# Patient Record
Sex: Female | Born: 1935 | Race: White | Hispanic: No | Smoking: Former smoker
Health system: Southern US, Community
[De-identification: ages and names within clinical notes are randomized; demographics above are authoritative.]

## PROBLEM LIST (undated history)

## (undated) DIAGNOSIS — C50919 Malignant neoplasm of unspecified site of unspecified female breast: Secondary | ICD-10-CM

## (undated) DIAGNOSIS — Z923 Personal history of irradiation: Secondary | ICD-10-CM

## (undated) DIAGNOSIS — F32A Depression, unspecified: Secondary | ICD-10-CM

## (undated) DIAGNOSIS — H269 Unspecified cataract: Secondary | ICD-10-CM

## (undated) DIAGNOSIS — R0602 Shortness of breath: Secondary | ICD-10-CM

## (undated) DIAGNOSIS — I10 Essential (primary) hypertension: Secondary | ICD-10-CM

## (undated) DIAGNOSIS — M549 Dorsalgia, unspecified: Secondary | ICD-10-CM

## (undated) DIAGNOSIS — F329 Major depressive disorder, single episode, unspecified: Secondary | ICD-10-CM

## (undated) DIAGNOSIS — R232 Flushing: Secondary | ICD-10-CM

## (undated) HISTORY — DX: Depression, unspecified: F32.A

## (undated) HISTORY — PX: BREAST BIOPSY: SHX20

## (undated) HISTORY — DX: Dorsalgia, unspecified: M54.9

## (undated) HISTORY — DX: Malignant neoplasm of unspecified site of unspecified female breast: C50.919

## (undated) HISTORY — DX: Flushing: R23.2

## (undated) HISTORY — PX: DILATION AND CURETTAGE OF UTERUS: SHX78

## (undated) HISTORY — DX: Shortness of breath: R06.02

## (undated) HISTORY — DX: Personal history of irradiation: Z92.3

## (undated) HISTORY — DX: Unspecified cataract: H26.9

## (undated) HISTORY — PX: CATARACT EXTRACTION: SUR2

## (undated) HISTORY — DX: Major depressive disorder, single episode, unspecified: F32.9

## (undated) HISTORY — PX: BREAST LUMPECTOMY: SHX2

## (undated) HISTORY — PX: COLONOSCOPY: SHX174

## (undated) HISTORY — DX: Essential (primary) hypertension: I10

---

## 1974-06-20 HISTORY — PX: BREAST SURGERY: SHX581

## 1998-11-27 ENCOUNTER — Other Ambulatory Visit: Admission: RE | Admit: 1998-11-27 | Discharge: 1998-11-27 | Payer: Self-pay | Admitting: Family Medicine

## 1999-04-16 ENCOUNTER — Encounter: Admission: RE | Admit: 1999-04-16 | Discharge: 1999-04-16 | Payer: Self-pay | Admitting: Family Medicine

## 1999-04-16 ENCOUNTER — Encounter: Payer: Self-pay | Admitting: Family Medicine

## 1999-05-11 ENCOUNTER — Encounter (INDEPENDENT_AMBULATORY_CARE_PROVIDER_SITE_OTHER): Payer: Self-pay | Admitting: *Deleted

## 1999-05-11 ENCOUNTER — Ambulatory Visit (HOSPITAL_COMMUNITY): Admission: RE | Admit: 1999-05-11 | Discharge: 1999-05-11 | Payer: Self-pay | Admitting: Gastroenterology

## 2000-06-07 ENCOUNTER — Encounter: Admission: RE | Admit: 2000-06-07 | Discharge: 2000-09-05 | Payer: Self-pay | Admitting: Internal Medicine

## 2000-09-18 ENCOUNTER — Encounter: Admission: RE | Admit: 2000-09-18 | Discharge: 2000-09-25 | Payer: Self-pay

## 2000-12-26 ENCOUNTER — Encounter: Admission: RE | Admit: 2000-12-26 | Discharge: 2001-01-01 | Payer: Self-pay | Admitting: Orthopedic Surgery

## 2001-02-15 ENCOUNTER — Other Ambulatory Visit: Admission: RE | Admit: 2001-02-15 | Discharge: 2001-02-15 | Payer: Self-pay | Admitting: Obstetrics and Gynecology

## 2001-10-02 ENCOUNTER — Encounter: Admission: RE | Admit: 2001-10-02 | Discharge: 2001-12-31 | Payer: Self-pay | Admitting: Family Medicine

## 2001-10-29 ENCOUNTER — Other Ambulatory Visit: Admission: RE | Admit: 2001-10-29 | Discharge: 2001-10-29 | Payer: Self-pay | Admitting: Obstetrics and Gynecology

## 2002-08-01 ENCOUNTER — Other Ambulatory Visit: Admission: RE | Admit: 2002-08-01 | Discharge: 2002-08-01 | Payer: Self-pay | Admitting: Obstetrics and Gynecology

## 2002-10-01 ENCOUNTER — Encounter (INDEPENDENT_AMBULATORY_CARE_PROVIDER_SITE_OTHER): Payer: Self-pay | Admitting: Specialist

## 2002-10-01 ENCOUNTER — Ambulatory Visit (HOSPITAL_COMMUNITY): Admission: RE | Admit: 2002-10-01 | Discharge: 2002-10-01 | Payer: Self-pay | Admitting: Gastroenterology

## 2005-03-16 ENCOUNTER — Other Ambulatory Visit: Admission: RE | Admit: 2005-03-16 | Discharge: 2005-03-16 | Payer: Self-pay | Admitting: Family Medicine

## 2005-11-04 ENCOUNTER — Encounter: Admission: RE | Admit: 2005-11-04 | Discharge: 2005-11-04 | Payer: Self-pay | Admitting: Family Medicine

## 2006-04-05 ENCOUNTER — Other Ambulatory Visit: Admission: RE | Admit: 2006-04-05 | Discharge: 2006-04-05 | Payer: Self-pay | Admitting: Family Medicine

## 2006-11-29 ENCOUNTER — Encounter: Admission: RE | Admit: 2006-11-29 | Discharge: 2006-11-29 | Payer: Self-pay | Admitting: Family Medicine

## 2007-10-15 ENCOUNTER — Other Ambulatory Visit: Admission: RE | Admit: 2007-10-15 | Discharge: 2007-10-15 | Payer: Self-pay | Admitting: Family Medicine

## 2007-12-12 ENCOUNTER — Encounter: Admission: RE | Admit: 2007-12-12 | Discharge: 2007-12-12 | Payer: Self-pay | Admitting: Family Medicine

## 2007-12-20 ENCOUNTER — Encounter: Admission: RE | Admit: 2007-12-20 | Discharge: 2007-12-20 | Payer: Self-pay | Admitting: Family Medicine

## 2008-10-20 ENCOUNTER — Other Ambulatory Visit: Admission: RE | Admit: 2008-10-20 | Discharge: 2008-10-20 | Payer: Self-pay | Admitting: Family Medicine

## 2008-11-03 ENCOUNTER — Encounter: Admission: RE | Admit: 2008-11-03 | Discharge: 2009-01-14 | Payer: Self-pay | Admitting: Family Medicine

## 2008-11-07 ENCOUNTER — Encounter: Admission: RE | Admit: 2008-11-07 | Discharge: 2008-11-07 | Payer: Self-pay | Admitting: Family Medicine

## 2008-12-12 ENCOUNTER — Encounter: Admission: RE | Admit: 2008-12-12 | Discharge: 2008-12-12 | Payer: Self-pay | Admitting: Family Medicine

## 2009-12-15 ENCOUNTER — Encounter: Admission: RE | Admit: 2009-12-15 | Discharge: 2009-12-15 | Payer: Self-pay | Admitting: Family Medicine

## 2010-07-12 ENCOUNTER — Encounter: Payer: Self-pay | Admitting: Family Medicine

## 2010-08-21 ENCOUNTER — Emergency Department (HOSPITAL_COMMUNITY)
Admission: EM | Admit: 2010-08-21 | Discharge: 2010-08-21 | Disposition: A | Discharge status: Home / Self Care | Payer: Medicare Other | Attending: Emergency Medicine | Admitting: Emergency Medicine

## 2010-08-21 ENCOUNTER — Emergency Department (HOSPITAL_COMMUNITY): Payer: Medicare Other

## 2010-08-21 DIAGNOSIS — E7800 Pure hypercholesterolemia, unspecified: Secondary | ICD-10-CM | POA: Insufficient documentation

## 2010-08-21 DIAGNOSIS — I10 Essential (primary) hypertension: Secondary | ICD-10-CM | POA: Insufficient documentation

## 2010-08-21 DIAGNOSIS — N390 Urinary tract infection, site not specified: Secondary | ICD-10-CM | POA: Insufficient documentation

## 2010-08-21 DIAGNOSIS — R5383 Other fatigue: Secondary | ICD-10-CM | POA: Insufficient documentation

## 2010-08-21 DIAGNOSIS — E1169 Type 2 diabetes mellitus with other specified complication: Secondary | ICD-10-CM | POA: Insufficient documentation

## 2010-08-21 DIAGNOSIS — R0789 Other chest pain: Secondary | ICD-10-CM | POA: Insufficient documentation

## 2010-08-21 DIAGNOSIS — R42 Dizziness and giddiness: Secondary | ICD-10-CM | POA: Insufficient documentation

## 2010-08-21 DIAGNOSIS — R5381 Other malaise: Secondary | ICD-10-CM | POA: Insufficient documentation

## 2010-08-21 DIAGNOSIS — R002 Palpitations: Secondary | ICD-10-CM | POA: Insufficient documentation

## 2010-08-21 LAB — CBC
Hemoglobin: 11.4 g/dL — ABNORMAL LOW (ref 12.0–15.0)
MCH: 29.9 pg (ref 26.0–34.0)
MCHC: 32.7 g/dL (ref 30.0–36.0)
Platelets: 279 10*3/uL (ref 150–400)
RBC: 3.81 MIL/uL — ABNORMAL LOW (ref 3.87–5.11)

## 2010-08-21 LAB — COMPREHENSIVE METABOLIC PANEL
ALT: 16 U/L (ref 0–35)
AST: 22 U/L (ref 0–37)
Albumin: 3.7 g/dL (ref 3.5–5.2)
CO2: 25 mEq/L (ref 19–32)
Calcium: 8.8 mg/dL (ref 8.4–10.5)
Creatinine, Ser: 1.81 mg/dL — ABNORMAL HIGH (ref 0.4–1.2)
GFR calc Af Amer: 33 mL/min — ABNORMAL LOW (ref >60)
Sodium: 136 mEq/L (ref 135–145)
Total Protein: 7.4 g/dL (ref 6.0–8.3)

## 2010-08-21 LAB — DIFFERENTIAL
Basophils Absolute: 0 10*3/uL (ref 0.0–0.1)
Basophils Relative: 1 % (ref 0–1)
Eosinophils Absolute: 0.1 10*3/uL (ref 0.0–0.7)
Monocytes Absolute: 0.5 10*3/uL (ref 0.1–1.0)
Monocytes Relative: 8 % (ref 3–12)
Neutro Abs: 4.3 10*3/uL (ref 1.7–7.7)
Neutrophils Relative %: 66 % (ref 43–77)

## 2010-08-21 LAB — POCT CARDIAC MARKERS
CKMB, poc: <1 ng/mL — ABNORMAL LOW (ref 1.0–8.0)
Myoglobin, poc: 87.6 ng/mL (ref 12–200)

## 2010-08-21 LAB — URINE MICROSCOPIC-ADD ON

## 2010-08-21 LAB — URINALYSIS, ROUTINE W REFLEX MICROSCOPIC
Bilirubin Urine: NEGATIVE
Glucose, UA: 100 mg/dL — AB
Hgb urine dipstick: NEGATIVE
Ketones, ur: NEGATIVE mg/dL
Nitrite: NEGATIVE
Specific Gravity, Urine: 1.014 (ref 1.005–1.030)
pH: 5 (ref 5.0–8.0)

## 2010-08-21 LAB — GLUCOSE, CAPILLARY: Glucose-Capillary: 121 mg/dL — ABNORMAL HIGH (ref 70–99)

## 2010-09-13 ENCOUNTER — Emergency Department (HOSPITAL_COMMUNITY): Payer: No Typology Code available for payment source

## 2010-09-13 ENCOUNTER — Emergency Department (HOSPITAL_COMMUNITY)
Admission: EM | Admit: 2010-09-13 | Discharge: 2010-09-13 | Disposition: A | Discharge status: Home / Self Care | Payer: No Typology Code available for payment source | Attending: General Surgery | Admitting: General Surgery

## 2010-09-13 DIAGNOSIS — E7800 Pure hypercholesterolemia, unspecified: Secondary | ICD-10-CM | POA: Insufficient documentation

## 2010-09-13 DIAGNOSIS — E119 Type 2 diabetes mellitus without complications: Secondary | ICD-10-CM | POA: Insufficient documentation

## 2010-09-13 DIAGNOSIS — R079 Chest pain, unspecified: Secondary | ICD-10-CM | POA: Insufficient documentation

## 2010-09-13 DIAGNOSIS — M25519 Pain in unspecified shoulder: Secondary | ICD-10-CM | POA: Insufficient documentation

## 2010-09-13 DIAGNOSIS — I10 Essential (primary) hypertension: Secondary | ICD-10-CM | POA: Insufficient documentation

## 2010-10-12 ENCOUNTER — Ambulatory Visit: Payer: Medicare Other | Attending: Family Medicine

## 2010-10-12 DIAGNOSIS — M2560 Stiffness of unspecified joint, not elsewhere classified: Secondary | ICD-10-CM | POA: Insufficient documentation

## 2010-10-12 DIAGNOSIS — M2550 Pain in unspecified joint: Secondary | ICD-10-CM | POA: Insufficient documentation

## 2010-10-12 DIAGNOSIS — R293 Abnormal posture: Secondary | ICD-10-CM | POA: Insufficient documentation

## 2010-10-12 DIAGNOSIS — R5381 Other malaise: Secondary | ICD-10-CM | POA: Insufficient documentation

## 2010-10-12 DIAGNOSIS — IMO0001 Reserved for inherently not codable concepts without codable children: Secondary | ICD-10-CM | POA: Insufficient documentation

## 2010-10-20 ENCOUNTER — Ambulatory Visit: Payer: Medicare Other | Attending: Family Medicine | Admitting: Physical Therapy

## 2010-10-20 DIAGNOSIS — M2560 Stiffness of unspecified joint, not elsewhere classified: Secondary | ICD-10-CM | POA: Insufficient documentation

## 2010-10-20 DIAGNOSIS — R5381 Other malaise: Secondary | ICD-10-CM | POA: Insufficient documentation

## 2010-10-20 DIAGNOSIS — R293 Abnormal posture: Secondary | ICD-10-CM | POA: Insufficient documentation

## 2010-10-20 DIAGNOSIS — M2550 Pain in unspecified joint: Secondary | ICD-10-CM | POA: Insufficient documentation

## 2010-10-20 DIAGNOSIS — IMO0001 Reserved for inherently not codable concepts without codable children: Secondary | ICD-10-CM | POA: Insufficient documentation

## 2010-10-25 ENCOUNTER — Ambulatory Visit: Payer: Medicare Other | Admitting: Physical Therapy

## 2010-10-28 ENCOUNTER — Ambulatory Visit: Payer: Medicare Other | Admitting: Physical Therapy

## 2010-11-03 ENCOUNTER — Ambulatory Visit: Payer: Medicare Other | Admitting: Physical Therapy

## 2010-11-04 ENCOUNTER — Encounter: Payer: Medicare Other | Admitting: Physical Therapy

## 2010-11-05 NOTE — Consult Note (Signed)
[facility]  Patient:    Brittney Wright, Brittney Wright                      MRN: [id number] Proc. Date: 06/07/00 Adm. Date:  [id number] Attending:  Lucianne Muss                          Consultation Report  CHIEF COMPLAINT:  Desires evaluation of her feet since she is diabetic.  PRESENT ILLNESS:  This 76 year old white female was diagnosed in 1998 as having non-insulin-dependent diabetes mellitus.  She has been maintained on Glucotrol one tablet a day.  The patient recently had a "ingrown toenail" of the right big toe laterally.  She used topical antibiotics and this has cleared.  Patient was just concerned about her feet because she is a diabetic and wanted to be seen in the clinic.  She has been treating her toes by cutting her nails herself but would like to have her nails trimmed in the nail clinic and to have her feet followed.  MEDICATIONS:  Lipitor, Glucotrol, Zestril, ______  and progesterone.  ALLERGIES:  No drug allergies.  PAST MEDICAL HISTORY AND REVIEW OF SYSTEMS:  Has had some hypertension, with elevated cholesterol.  PHYSICAL EXAMINATION  EXTREMITIES:  Examination reveals healthy-appearing feet with no breakdown of the skin.  She has normal sensation and the temperatures are normal.  She has good posterior tibial pulses.  She has some evidence of an old infection of the right big toe laterally but there is no infection at this time.  She is cutting her nails properly, straight across and not down on the sides.  ASSESSMENT AND PLAN:  Patient was advised to observe her feet on a regular basis to be sure that she is not having any breakdown of the skin.  She would like to be seen back in the nail clinic to have her nails trimmed.  We will see her back in the clinic in three months to have her nails trimmed. DD:  06/07/00 TD:  06/08/00 Job: [id number]

## 2010-11-05 NOTE — Op Note (Signed)
   NAME:  Brittney Wright, Brittney Wright                         ACCOUNT NO.:  [id number]   MEDICAL RECORD NO.:  [id number]                   PATIENT TYPE:  AMB   LOCATION:  ENDO                                 FAC[facility]  [facility]   PHYSICIAN:  James L. Rolla Flatten., M.D.          DATE OF BIRTH:  [birth year]   DATE OF PROCEDURE:  10/01/2002  DATE OF DISCHARGE:                                 OPERATIVE REPORT   PROCEDURE:  Colonoscopy and polypectomy.   MEDICATIONS:  Fentanyl 75 mcg, Versed 7.5 mg IV.   INDICATIONS:  The patient had had a previous colon polyp removed three years  ago.  This is done as a three-year follow-up.   DESCRIPTION OF PROCEDURE:  The procedure had been explained to the patient  and consent obtained.  With the patient in the left lateral decubitus  position, the Olympus pediatric adjustable scope was inserted and advanced  under direct visualization.  The prep was quite good.  We were able to reach  the cecum without difficulty.  The ileocecal valve and appendiceal orifice  were seen.  The scope was withdrawn, and the cecum, ascending colon,  transverse colon, descending and sigmoid colon were seen well and no polyp  was seen.  The scope was withdrawn down the rectum, and there was a 0.75 cm  polyp in the rectum.  It was removed with the snare and sucked through the  scope.  There was no bleeding.  No other polyps were seen.  The scope was  withdrawn.  The patient tolerated the procedure well and was maintained on  low-flow oxygen and pulse oximetry throughout the procedure.   ASSESSMENT:  Rectal polyp, snared.   PLAN:  Routine postpolypectomy instructions.  Will recommend repeating in  three years.                                               James L. Rolla Flatten., M.D.    Jaynie Bream  D:  10/01/2002  T:  10/01/2002  Job:  [id number]   cc:   Russ Halo. Sheryn Bison, M.D.  [address]  [place]  [place] [address]  Fax: [phone]

## 2010-11-08 ENCOUNTER — Ambulatory Visit: Payer: Medicare Other | Admitting: Physical Therapy

## 2010-11-11 ENCOUNTER — Ambulatory Visit: Payer: Medicare Other | Admitting: Physical Therapy

## 2010-11-16 ENCOUNTER — Ambulatory Visit: Payer: Medicare Other

## 2010-11-18 ENCOUNTER — Ambulatory Visit: Payer: Medicare Other | Admitting: Physical Therapy

## 2010-11-19 ENCOUNTER — Other Ambulatory Visit: Payer: Self-pay | Admitting: Family Medicine

## 2010-11-19 DIAGNOSIS — Z1231 Encounter for screening mammogram for malignant neoplasm of breast: Secondary | ICD-10-CM

## 2010-11-22 ENCOUNTER — Ambulatory Visit: Payer: Medicare Other | Attending: Family Medicine | Admitting: Physical Therapy

## 2010-11-22 DIAGNOSIS — R293 Abnormal posture: Secondary | ICD-10-CM | POA: Insufficient documentation

## 2010-11-22 DIAGNOSIS — R5381 Other malaise: Secondary | ICD-10-CM | POA: Insufficient documentation

## 2010-11-22 DIAGNOSIS — M2550 Pain in unspecified joint: Secondary | ICD-10-CM | POA: Insufficient documentation

## 2010-11-22 DIAGNOSIS — IMO0001 Reserved for inherently not codable concepts without codable children: Secondary | ICD-10-CM | POA: Insufficient documentation

## 2010-11-22 DIAGNOSIS — M2560 Stiffness of unspecified joint, not elsewhere classified: Secondary | ICD-10-CM | POA: Insufficient documentation

## 2010-11-25 ENCOUNTER — Encounter: Payer: No Typology Code available for payment source | Admitting: Physical Therapy

## 2010-11-29 ENCOUNTER — Encounter: Payer: Medicare Other | Admitting: Physical Therapy

## 2010-12-02 ENCOUNTER — Ambulatory Visit: Payer: Medicare Other | Admitting: Physical Therapy

## 2010-12-06 ENCOUNTER — Ambulatory Visit: Payer: Medicare Other | Admitting: Rehabilitative and Restorative Service Providers"

## 2010-12-08 ENCOUNTER — Ambulatory Visit: Payer: Medicare Other | Admitting: Rehabilitative and Restorative Service Providers"

## 2010-12-15 ENCOUNTER — Ambulatory Visit: Payer: Medicare Other | Admitting: Physical Therapy

## 2010-12-16 ENCOUNTER — Ambulatory Visit: Payer: Medicare Other | Admitting: Physical Therapy

## 2010-12-17 ENCOUNTER — Ambulatory Visit
Admission: RE | Admit: 2010-12-17 | Discharge: 2010-12-17 | Disposition: A | Discharge status: Home / Self Care | Payer: Medicare Other | Source: Ambulatory Visit | Attending: Family Medicine | Admitting: Family Medicine

## 2010-12-17 DIAGNOSIS — Z1231 Encounter for screening mammogram for malignant neoplasm of breast: Secondary | ICD-10-CM

## 2010-12-20 ENCOUNTER — Ambulatory Visit: Payer: Medicare Other | Attending: Family Medicine | Admitting: Physical Therapy

## 2010-12-20 DIAGNOSIS — R293 Abnormal posture: Secondary | ICD-10-CM | POA: Insufficient documentation

## 2010-12-20 DIAGNOSIS — M2560 Stiffness of unspecified joint, not elsewhere classified: Secondary | ICD-10-CM | POA: Insufficient documentation

## 2010-12-20 DIAGNOSIS — IMO0001 Reserved for inherently not codable concepts without codable children: Secondary | ICD-10-CM | POA: Insufficient documentation

## 2010-12-20 DIAGNOSIS — R5381 Other malaise: Secondary | ICD-10-CM | POA: Insufficient documentation

## 2010-12-20 DIAGNOSIS — M2550 Pain in unspecified joint: Secondary | ICD-10-CM | POA: Insufficient documentation

## 2010-12-27 ENCOUNTER — Encounter: Payer: Medicare Other | Admitting: Physical Therapy

## 2010-12-29 ENCOUNTER — Encounter: Payer: Medicare Other | Admitting: Physical Therapy

## 2011-01-05 ENCOUNTER — Ambulatory Visit: Payer: Medicare Other | Admitting: Rehabilitative and Restorative Service Providers"

## 2011-06-21 HISTORY — PX: BREAST LUMPECTOMY: SHX2

## 2011-10-18 ENCOUNTER — Other Ambulatory Visit: Payer: Self-pay | Admitting: Physician Assistant

## 2011-11-30 ENCOUNTER — Other Ambulatory Visit: Payer: Self-pay | Admitting: Physician Assistant

## 2011-12-19 DIAGNOSIS — C50919 Malignant neoplasm of unspecified site of unspecified female breast: Secondary | ICD-10-CM

## 2011-12-19 HISTORY — DX: Malignant neoplasm of unspecified site of unspecified female breast: C50.919

## 2011-12-20 ENCOUNTER — Other Ambulatory Visit: Payer: Self-pay | Admitting: Family Medicine

## 2011-12-20 DIAGNOSIS — N6310 Unspecified lump in the right breast, unspecified quadrant: Secondary | ICD-10-CM

## 2011-12-30 ENCOUNTER — Other Ambulatory Visit: Payer: Self-pay | Admitting: Family Medicine

## 2011-12-30 ENCOUNTER — Ambulatory Visit
Admission: RE | Admit: 2011-12-30 | Discharge: 2011-12-30 | Disposition: A | Discharge status: Home / Self Care | Payer: Medicare Other | Source: Ambulatory Visit | Attending: Family Medicine | Admitting: Family Medicine

## 2011-12-30 DIAGNOSIS — N6310 Unspecified lump in the right breast, unspecified quadrant: Secondary | ICD-10-CM

## 2012-01-10 ENCOUNTER — Ambulatory Visit
Admission: RE | Admit: 2012-01-10 | Discharge: 2012-01-10 | Disposition: A | Discharge status: Home / Self Care | Payer: Medicare Other | Source: Ambulatory Visit | Attending: Family Medicine | Admitting: Family Medicine

## 2012-01-10 ENCOUNTER — Other Ambulatory Visit: Payer: Self-pay | Admitting: Family Medicine

## 2012-01-10 DIAGNOSIS — N6320 Unspecified lump in the left breast, unspecified quadrant: Secondary | ICD-10-CM

## 2012-01-10 DIAGNOSIS — N6310 Unspecified lump in the right breast, unspecified quadrant: Secondary | ICD-10-CM

## 2012-01-11 ENCOUNTER — Other Ambulatory Visit: Payer: Self-pay | Admitting: Family Medicine

## 2012-01-11 DIAGNOSIS — C50912 Malignant neoplasm of unspecified site of left female breast: Secondary | ICD-10-CM

## 2012-01-13 ENCOUNTER — Other Ambulatory Visit: Payer: Self-pay | Admitting: *Deleted

## 2012-01-13 ENCOUNTER — Telehealth: Payer: Self-pay | Admitting: *Deleted

## 2012-01-13 DIAGNOSIS — C50419 Malignant neoplasm of upper-outer quadrant of unspecified female breast: Secondary | ICD-10-CM

## 2012-01-13 DIAGNOSIS — C50412 Malignant neoplasm of upper-outer quadrant of left female breast: Secondary | ICD-10-CM | POA: Insufficient documentation

## 2012-01-13 NOTE — Telephone Encounter (Signed)
Confirmed [facility] for 01/18/12 at 0830 .  Instructions and contact information given.

## 2012-01-18 ENCOUNTER — Ambulatory Visit
Admission: RE | Admit: 2012-01-18 | Discharge: 2012-01-18 | Disposition: A | Discharge status: Home / Self Care | Payer: Medicare Other | Source: Ambulatory Visit | Attending: Radiation Oncology | Admitting: Radiation Oncology

## 2012-01-18 ENCOUNTER — Ambulatory Visit: Payer: Medicare Other | Admitting: Physical Therapy

## 2012-01-18 ENCOUNTER — Encounter (INDEPENDENT_AMBULATORY_CARE_PROVIDER_SITE_OTHER): Payer: Self-pay | Admitting: Surgery

## 2012-01-18 ENCOUNTER — Other Ambulatory Visit (INDEPENDENT_AMBULATORY_CARE_PROVIDER_SITE_OTHER): Payer: Self-pay | Admitting: Surgery

## 2012-01-18 ENCOUNTER — Encounter: Payer: Self-pay | Admitting: *Deleted

## 2012-01-18 ENCOUNTER — Ambulatory Visit (HOSPITAL_BASED_OUTPATIENT_CLINIC_OR_DEPARTMENT_OTHER): Payer: Medicare Other | Admitting: Oncology

## 2012-01-18 ENCOUNTER — Ambulatory Visit (HOSPITAL_BASED_OUTPATIENT_CLINIC_OR_DEPARTMENT_OTHER): Payer: Medicare Other | Admitting: Surgery

## 2012-01-18 ENCOUNTER — Ambulatory Visit: Payer: Medicare Other

## 2012-01-18 ENCOUNTER — Other Ambulatory Visit (HOSPITAL_BASED_OUTPATIENT_CLINIC_OR_DEPARTMENT_OTHER): Payer: Medicare Other | Admitting: Lab

## 2012-01-18 VITALS — BP 132/80 | HR 81 | Temp 97.6°F | Ht 64.0 in | Wt 196.6 lb

## 2012-01-18 DIAGNOSIS — C50419 Malignant neoplasm of upper-outer quadrant of unspecified female breast: Secondary | ICD-10-CM

## 2012-01-18 DIAGNOSIS — D0510 Intraductal carcinoma in situ of unspecified breast: Secondary | ICD-10-CM

## 2012-01-18 DIAGNOSIS — D0590 Unspecified type of carcinoma in situ of unspecified breast: Secondary | ICD-10-CM

## 2012-01-18 DIAGNOSIS — E119 Type 2 diabetes mellitus without complications: Secondary | ICD-10-CM

## 2012-01-18 DIAGNOSIS — I10 Essential (primary) hypertension: Secondary | ICD-10-CM

## 2012-01-18 LAB — COMPREHENSIVE METABOLIC PANEL
ALT: 21 U/L (ref 0–35)
AST: 22 U/L (ref 0–37)
Alkaline Phosphatase: 59 U/L (ref 39–117)
BUN: 20 mg/dL (ref 6–23)
Creatinine, Ser: 1.68 mg/dL — ABNORMAL HIGH (ref 0.50–1.10)
Potassium: 4.1 mEq/L (ref 3.5–5.3)

## 2012-01-18 LAB — CBC WITH DIFFERENTIAL/PLATELET
BASO%: 0.7 % (ref 0.0–2.0)
Basophils Absolute: 0 10*3/uL (ref 0.0–0.1)
EOS%: 2.6 % (ref 0.0–7.0)
HGB: 11.6 g/dL (ref 11.6–15.9)
MCH: 30.7 pg (ref 25.1–34.0)
MCHC: 33.2 g/dL (ref 31.5–36.0)
MCV: 92.6 fL (ref 79.5–101.0)
MONO%: 6.9 % (ref 0.0–14.0)
NEUT%: 70.7 % (ref 38.4–76.8)
RDW: 13.1 % (ref 11.2–14.5)

## 2012-01-18 NOTE — Progress Notes (Signed)
[facility] Psychosocial Distress Screening Clinical Social Work  Patient completed distress screening protocol.  The patient scored a 5 on the Psychosocial Distress Thermometer which indicates moderate distress. Clinical Social Worker met with patient in [facility] to assess for distress and other psychosocial needs.  Pt stated she felt relieved after speaking with the physicians and receiving her treatment plan.  CSW informed pt of the support team and support services at [facility].  CSW provided pt with a pt and family support calender and information on other support services.  Pt was agreeable to CSW making a reach to recovery referral, and open to support group.  CSW encouraged pt to call with any additional questions or concerns.       Clinical Social Worker follow up needed:not at this time   Johnnye Lana, MSW, LCSW Clinical Social Worker [facility] [phone]
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
## 2012-01-18 NOTE — Progress Notes (Signed)
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 female who is seen today at the [facility] for discussion of possible radiation therapy in the management of her DCIS of the left breast. At the time of screening mammography at the [facility] on 12/30/2011 she was noted to have a subcentimeter nodule within the upper-outer quadrant of the left breast. There were no suspicious microcalcifications. No mass was palpable. On ultrasound this measured 0.7 x 0.5 x 0.5 cm. Biopsy was diagnostic for DCIS. A quantitative estrogen and progesterone receptor report will follow definitive surgery. She does have a history of renal insufficiency secondary to hypertension. She has a good performance status.  PREVIOUS RADIATION THERAPY: No   PAST MEDICAL HISTORY:  has a past medical history of Breast cancer; Diabetes mellitus; Hypertension; SOB (shortness of breath) on exertion; Back pain; Depression; and Hot flashes.     PAST SURGICAL HISTORY:  Past Surgical History  Procedure Date  . Dilation and curettage of uterus      FAMILY HISTORY: family history is not on file. Her father died of cardiac disease and 86, in her mother died of cardiac disease at 34. A paternal aunt was diagnosed with breast cancer in her 42s.   SOCIAL HISTORY:  reports that she quit smoking about 20 years ago. She does not have any smokeless tobacco history on file. She reports that she drinks alcohol. She reports that she does not use illicit drugs. Divorced, 2 children. 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   ALLERGIES: Minocycline   MEDICATIONS:  Current Outpatient  Prescriptions  Medication Sig Dispense Refill  . atorvastatin (LIPITOR) 80 MG tablet Take 80 mg by mouth daily.      . fenofibrate 160 MG tablet Take 160 mg by mouth daily.      [name] glipiZIDE (GLUCOTROL) 10 MG tablet Take 10 mg by mouth daily.      [name] lisinopril-hydrochlorothiazide (PRINZIDE,ZESTORETIC) 20-25 MG per tablet Take 1 tablet by mouth daily.         REVIEW OF SYSTEMS:  Pertinent items are noted in HPI.    PHYSICAL EXAM: Alert and oriented 77 year old female appearing younger than her stated age. Wt Readings from Last 3 Encounters:  01/18/12 196 lb 9.6 oz (89.177 kg)   Temp Readings from Last 3 Encounters:  01/18/12 97.6 F (36.4 C)    BP Readings from Last 3 Encounters:  01/18/12 132/80   Pulse Readings from Last 3 Encounters:  01/18/12 81   Head and neck examination grossly unremarkable. Nodes: Without palpable cervical, supraclavicular, or axillary lymphadenopathy. Chest: Lungs clear. Heart: Regular in rhythm. Back: Without spinal or CVA tenderness. Breasts: There is a punctate biopsy wound along the upper-outer quadrant of the left breast at 1:00. There is a small area of ecchymosis. No masses are appreciated. Abdomen without hepatomegaly. Extremities without edema.   LABORATORY DATA:  Lab Results  Component Value Date   WBC 6.2 01/18/2012   HGB 11.6 01/18/2012   HCT 35.1 01/18/2012   MCV 92.6 01/18/2012   PLT 256 01/18/2012  Lab Results  Component Value Date   NA 134* 01/18/2012   K 4.1 01/18/2012   CL 100 01/18/2012   CO2 24 01/18/2012   Lab Results  Component Value Date   ALT 21 01/18/2012   AST 22 01/18/2012   ALKPHOS 59 01/18/2012   BILITOT 0.3 01/18/2012      IMPRESSION: DCIS of the left breast. She desires breast preservation. I explained to the patient that her management options include mastectomy versus partial mastectomy with or without radiation therapy, with without adjuvant hormone therapy depending on her hormone receptor data. Based on the size of  her DCIS, and age, she can probably be at least treated by local excision with without adjuvant hormone therapy provided that she has adequate margins. Even if she is found to have invasive disease she is still a candidate for adjuvant hormone therapy alone. We generally reserve radiation therapy for patients with high-grade disease, close margins, extensive disease in patients of young age. The patient fully understands.   PLAN: As discussed above. She will also meet with Dr. Truddie Coco for discussion of adjuvant hormone therapy which has been shown to decrease the risk for recurrent disease but also development of a new primary in either breast.   I spent 40 minutes minutes face to face with the patient and more than 50% of that time was spent in counseling and/or coordination of care.

## 2012-01-18 NOTE — Progress Notes (Signed)
Patient ID: Brittney Wright, female   DOB: 1935-03-22, 77 y.o.   MRN: [id number]  No chief complaint on file.   HPI Brittney Wright is a 77 y.o. female.   HPI She is referred by Dr. Kathyrn Lass after mammogram and u/s demonstrated a lesion in the left breast.  Biopsy revealed a low grade DCIS.  She has no complaints regarding her breasts.  She denies nipple discharge.  She has no previous history of breast CA Past Medical History  Diagnosis Date  . Breast cancer   . Diabetes mellitus   . Hypertension   . SOB (shortness of breath) on exertion   . Back pain   . Depression   . Hot flashes     Past Surgical History  Procedure Date  . Dilation and curettage of uterus     History reviewed. No pertinent family history.  Social History History  Substance Use Topics  . Smoking status: Former Smoker    Quit date: 06/21/1991  . Smokeless tobacco: Not on file  . Alcohol Use: Yes    Allergies  Allergen Reactions  . Minocycline     Current Outpatient Prescriptions  Medication Sig Dispense Refill  . atorvastatin (LIPITOR) 80 MG tablet Take 80 mg by mouth daily.      . fenofibrate 160 MG tablet Take 160 mg by mouth daily.      [name] glipiZIDE (GLUCOTROL) 10 MG tablet Take 10 mg by mouth daily.      [name] lisinopril-hydrochlorothiazide (PRINZIDE,ZESTORETIC) 20-25 MG per tablet Take 1 tablet by mouth daily.        Review of Systems Review of Systems  Constitutional: Negative for fever, chills and unexpected weight change.  HENT: Negative for hearing loss, congestion, sore throat, trouble swallowing and voice change.   Eyes: Negative for visual disturbance.  Respiratory: Positive for shortness of breath. Negative for cough and wheezing.   Cardiovascular: Positive for leg swelling. Negative for chest pain and palpitations.  Gastrointestinal: Negative for nausea, vomiting, abdominal pain, diarrhea, constipation, blood in stool, abdominal distention and anal bleeding.  Genitourinary:  Negative for hematuria, vaginal bleeding and difficulty urinating.  Musculoskeletal: Negative for arthralgias.  Skin: Negative for rash and wound.  Neurological: Negative for seizures, syncope and headaches.  Hematological: Negative for adenopathy. Does not bruise/bleed easily.  Psychiatric/Behavioral: Negative for confusion.    Wt Readings from Last 3 Encounters:  01/18/12 196 lb 9.6 oz (89.177 kg)   Temp Readings from Last 3 Encounters:  01/18/12 97.6 F (36.4 C)    BP Readings from Last 3 Encounters:  01/18/12 132/80   Pulse Readings from Last 3 Encounters:  01/18/12 81   Physical Exam Physical Exam  Constitutional: She is oriented to person, place, and time. She appears well-developed and well-nourished. No distress.  HENT:  Head: Normocephalic and atraumatic.  Right Ear: External ear normal.  Left Ear: External ear normal.  Nose: Nose normal.  Mouth/Throat: Oropharynx is clear and moist. No oropharyngeal exudate.  Eyes: Conjunctivae are normal. Pupils are equal, round, and reactive to light. Right eye exhibits no discharge. Left eye exhibits no discharge. No scleral icterus.  Neck: Normal range of motion. Neck supple. No tracheal deviation present. No thyromegaly present.  Cardiovascular: Normal rate, regular rhythm, normal heart sounds and intact distal pulses.   No murmur heard. Pulmonary/Chest: Effort normal and breath sounds normal. No respiratory distress. She has no wheezes. She has no rales.  Abdominal: Soft.  Musculoskeletal: Normal range of motion. She exhibits  no edema and no tenderness.  Lymphadenopathy:    She has no cervical adenopathy.  Neurological: She is alert and oriented to person, place, and time.  Skin: Skin is warm and dry. No rash noted. She is not diaphoretic. No erythema.  Psychiatric: Her behavior is normal. Judgment normal.  Breast:  There are no palpable masses in either breast.  There is no axillary, supraclavicular, or cervical adenopathy  on either side.  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       Plan   Per NCCN guidelines Needle localized left breast lumpectomy is recommended.  I  Discussed the risks in detail which include but are not limited to bleeding, infection, need for further surgery should margins be positive, etc.  She agrees to proceed.  Likelihood of success is good.        Aditri Louischarles A 01/18/2012, 10:45 AM

## 2012-01-19 ENCOUNTER — Encounter: Payer: Self-pay | Admitting: *Deleted

## 2012-01-19 NOTE — Progress Notes (Signed)
Mailed after appt letter to pt. 

## 2012-01-23 ENCOUNTER — Encounter: Payer: Self-pay | Admitting: *Deleted

## 2012-01-23 ENCOUNTER — Telehealth: Payer: Self-pay | Admitting: *Deleted

## 2012-01-23 NOTE — Telephone Encounter (Signed)
Spoke to pt concerning [place] from 01/18/12.  Pt denies questions or concerns regarding dx or treatment care plan.  Encourage pt to call with needs.  Received verbal understanding.  Contact information given.

## 2012-01-24 ENCOUNTER — Telehealth: Payer: Self-pay | Admitting: Oncology

## 2012-01-24 NOTE — Telephone Encounter (Signed)
lmonvm adviisng the pt of her sept appt

## 2012-01-30 ENCOUNTER — Encounter (HOSPITAL_BASED_OUTPATIENT_CLINIC_OR_DEPARTMENT_OTHER): Payer: Self-pay | Admitting: *Deleted

## 2012-01-30 NOTE — Progress Notes (Signed)
To come in for bmet-ekg Had a stress test 10 yr ago for family hx-normal-no copy. Wants to go over mac and general anesthesia dos-has never had general anesth.

## 2012-01-31 ENCOUNTER — Encounter (HOSPITAL_BASED_OUTPATIENT_CLINIC_OR_DEPARTMENT_OTHER)
Admission: RE | Admit: 2012-01-31 | Discharge: 2012-01-31 | Disposition: A | Discharge status: Home / Self Care | Payer: Medicare Other | Source: Ambulatory Visit | Attending: Surgery | Admitting: Surgery

## 2012-01-31 ENCOUNTER — Other Ambulatory Visit: Payer: Self-pay

## 2012-01-31 LAB — BASIC METABOLIC PANEL
BUN: 36 mg/dL — ABNORMAL HIGH (ref 6–23)
CO2: 24 mEq/L (ref 19–32)
Chloride: 103 mEq/L (ref 96–112)
Creatinine, Ser: 1.54 mg/dL — ABNORMAL HIGH (ref 0.50–1.10)
GFR calc Af Amer: 37 mL/min — ABNORMAL LOW (ref >90)
GFR calc non Af Amer: 32 mL/min — ABNORMAL LOW (ref >90)
Glucose, Bld: 230 mg/dL — ABNORMAL HIGH (ref 70–99)
Potassium: 4.9 mEq/L (ref 3.5–5.1)

## 2012-02-02 NOTE — H&P (Signed)
Patient ID: Brittney Wright, female DOB: [birth year], 77 y.o. MRN: [id number]  No chief complaint on file.   HPI  Brittney Wright is a 77 y.o. female.  HPI  She is referred by Dr. Kathyrn Lass after mammogram and u/s demonstrated a lesion in the left breast. Biopsy revealed a low grade DCIS. She has no complaints regarding her breasts. She denies nipple discharge. She has no previous history of breast CA  Past Medical History   Diagnosis  Date   .  Breast cancer    .  Diabetes mellitus    .  Hypertension    .  SOB (shortness of breath) on exertion    .  Back pain    .  Depression    .  Hot flashes     Past Surgical History   Procedure  Date   .  Dilation and curettage of uterus     History reviewed. No pertinent family history.  Social History  History   Substance Use Topics   .  Smoking status:  Former Smoker     Quit date:  06/21/1991   .  Smokeless tobacco:  Not on file   .  Alcohol Use:  Yes    Allergies   Allergen  Reactions   .  Minocycline     Current Outpatient Prescriptions   Medication  Sig  Dispense  Refill   .  atorvastatin (LIPITOR) 80 MG tablet  Take 80 mg by mouth daily.     .  fenofibrate 160 MG tablet  Take 160 mg by mouth daily.     [name]  glipiZIDE (GLUCOTROL) 10 MG tablet  Take 10 mg by mouth daily.     [name]  lisinopril-hydrochlorothiazide (PRINZIDE,ZESTORETIC) 20-25 MG per tablet  Take 1 tablet by mouth daily.      Review of Systems  Review of Systems  Constitutional: Negative for fever, chills and unexpected weight change.  HENT: Negative for hearing loss, congestion, sore throat, trouble swallowing and voice change.  Eyes: Negative for visual disturbance.  Respiratory: Positive for shortness of breath. Negative for cough and wheezing.  Cardiovascular: Positive for leg swelling. Negative for chest pain and palpitations.  Gastrointestinal: Negative for nausea, vomiting, abdominal pain, diarrhea, constipation, blood in stool, abdominal distention and anal  bleeding.  Genitourinary: Negative for hematuria, vaginal bleeding and difficulty urinating.  Musculoskeletal: Negative for arthralgias.  Skin: Negative for rash and wound.  Neurological: Negative for seizures, syncope and headaches.  Hematological: Negative for adenopathy. Does not bruise/bleed easily.  Psychiatric/Behavioral: Negative for confusion.   Wt Readings from Last 3 Encounters:   01/18/12  196 lb 9.6 oz (89.177 kg)    Temp Readings from Last 3 Encounters:   01/18/12  97.6 F (36.4 C)    BP Readings from Last 3 Encounters:   01/18/12  132/80    Pulse Readings from Last 3 Encounters:   01/18/12  81    Physical Exam  Physical Exam  Constitutional: She is oriented to person, place, and time. She appears well-developed and well-nourished. No distress.  HENT:  Head: Normocephalic and atraumatic.  Right Ear: External ear normal.  Left Ear: External ear normal.  Nose: Nose normal.  Mouth/Throat: Oropharynx is clear and moist. No oropharyngeal exudate.  Eyes: Conjunctivae are normal. Pupils are equal, round, and reactive to light. Right eye exhibits no discharge. Left eye exhibits no discharge. No scleral icterus.  Neck: Normal range of motion. Neck supple. No tracheal deviation present. No  thyromegaly present.  Cardiovascular: Normal rate, regular rhythm, normal heart sounds and intact distal pulses.  No murmur heard.  Pulmonary/Chest: Effort normal and breath sounds normal. No respiratory distress. She has no wheezes. She has no rales.  Abdominal: Soft.  Musculoskeletal: Normal range of motion. She exhibits no edema and no tenderness.  Lymphadenopathy:  She has no cervical adenopathy.  Neurological: She is alert and oriented to person, place, and time.  Skin: Skin is warm and dry. No rash noted. She is not diaphoretic. No erythema.  Psychiatric: Her behavior is normal. Judgment normal.  Breast: There are no palpable masses in either breast. There is no axillary,  supraclavicular, or cervical adenopathy on either side.  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   Plan  Per NCCN guidelines Needle localized left breast lumpectomy is recommended. I Discussed the risks in detail which include but are not limited to bleeding, infection, need for further surgery should margins be positive, etc. She agrees to proceed. Likelihood of success is good.   Jabreel Chimento A

## 2012-02-03 ENCOUNTER — Encounter (HOSPITAL_BASED_OUTPATIENT_CLINIC_OR_DEPARTMENT_OTHER)
Admission: RE | Disposition: A | Discharge status: Home / Self Care | Payer: Self-pay | Source: Ambulatory Visit | Attending: Surgery

## 2012-02-03 ENCOUNTER — Ambulatory Visit
Admission: RE | Admit: 2012-02-03 | Discharge: 2012-02-03 | Disposition: A | Discharge status: Home / Self Care | Payer: Medicare Other | Source: Ambulatory Visit | Attending: Surgery | Admitting: Surgery

## 2012-02-03 ENCOUNTER — Encounter (HOSPITAL_BASED_OUTPATIENT_CLINIC_OR_DEPARTMENT_OTHER): Payer: Self-pay | Admitting: Certified Registered"

## 2012-02-03 ENCOUNTER — Ambulatory Visit (HOSPITAL_BASED_OUTPATIENT_CLINIC_OR_DEPARTMENT_OTHER)
Admission: RE | Admit: 2012-02-03 | Discharge: 2012-02-03 | Disposition: A | Discharge status: Home / Self Care | Payer: Medicare Other | Source: Ambulatory Visit | Attending: Surgery | Admitting: Surgery

## 2012-02-03 ENCOUNTER — Encounter (HOSPITAL_BASED_OUTPATIENT_CLINIC_OR_DEPARTMENT_OTHER): Payer: Self-pay

## 2012-02-03 ENCOUNTER — Ambulatory Visit (HOSPITAL_BASED_OUTPATIENT_CLINIC_OR_DEPARTMENT_OTHER): Payer: Medicare Other | Admitting: Certified Registered"

## 2012-02-03 DIAGNOSIS — D0510 Intraductal carcinoma in situ of unspecified breast: Secondary | ICD-10-CM

## 2012-02-03 DIAGNOSIS — E119 Type 2 diabetes mellitus without complications: Secondary | ICD-10-CM | POA: Insufficient documentation

## 2012-02-03 DIAGNOSIS — I10 Essential (primary) hypertension: Secondary | ICD-10-CM | POA: Insufficient documentation

## 2012-02-03 DIAGNOSIS — Z01812 Encounter for preprocedural laboratory examination: Secondary | ICD-10-CM | POA: Insufficient documentation

## 2012-02-03 DIAGNOSIS — Z01810 Encounter for preprocedural cardiovascular examination: Secondary | ICD-10-CM | POA: Insufficient documentation

## 2012-02-03 DIAGNOSIS — D0590 Unspecified type of carcinoma in situ of unspecified breast: Secondary | ICD-10-CM | POA: Insufficient documentation

## 2012-02-03 SURGERY — BREAST LUMPECTOMY WITH NEEDLE LOCALIZATION
Anesthesia: General | Site: Breast | Laterality: Left | Wound class: Clean

## 2012-02-03 MED ORDER — ACETAMINOPHEN 325 MG PO TABS: 650.0000 mg | ORAL_TABLET | ORAL | Status: DC | PRN

## 2012-02-03 MED ORDER — CEFAZOLIN SODIUM-DEXTROSE 2-3 GM-% IV SOLR
2.0000 g | INTRAVENOUS | Status: AC
  Administered 2012-02-03: 2 g via INTRAVENOUS

## 2012-02-03 MED ORDER — ACETAMINOPHEN 650 MG RE SUPP: 650.0000 mg | RECTAL | Status: DC | PRN

## 2012-02-03 MED ORDER — LACTATED RINGERS IV SOLN
INTRAVENOUS | Status: DC
  Administered 2012-02-03: 11:00:00 via INTRAVENOUS

## 2012-02-03 MED ORDER — ONDANSETRON HCL 4 MG/2ML IJ SOLN: 4.0000 mg | Freq: Four times a day (QID) | INTRAMUSCULAR | Status: DC | PRN

## 2012-02-03 MED ORDER — SODIUM CHLORIDE 0.9 % IJ SOLN: 3.0000 mL | INTRAMUSCULAR | Status: DC | PRN

## 2012-02-03 MED ORDER — OXYCODONE HCL 5 MG PO TABS: 5.0000 mg | ORAL_TABLET | ORAL | Status: DC | PRN

## 2012-02-03 MED ORDER — SODIUM CHLORIDE 0.9 % IJ SOLN
3.0000 mL | Freq: Two times a day (BID) | INTRAMUSCULAR | Status: DC
Start: 2012-02-03 — End: 2012-02-03

## 2012-02-03 MED ORDER — MORPHINE SULFATE 2 MG/ML IJ SOLN: 2.0000 mg | INTRAMUSCULAR | Status: DC | PRN

## 2012-02-03 MED ORDER — KETOROLAC TROMETHAMINE 30 MG/ML IJ SOLN
INTRAMUSCULAR | Status: DC | PRN
  Administered 2012-02-03: 30 mg via INTRAVENOUS

## 2012-02-03 MED ORDER — SODIUM CHLORIDE 0.9 % IV SOLN: 250.0000 mL | INTRAVENOUS | Status: DC | PRN

## 2012-02-03 MED ORDER — PROPOFOL 10 MG/ML IV EMUL
INTRAVENOUS | Status: DC | PRN
  Administered 2012-02-03: 200 mg via INTRAVENOUS

## 2012-02-03 MED ORDER — FENTANYL CITRATE 0.05 MG/ML IJ SOLN
INTRAMUSCULAR | Status: DC | PRN
  Administered 2012-02-03: 25 ug via INTRAVENOUS
  Administered 2012-02-03: 75 ug via INTRAVENOUS

## 2012-02-03 MED ORDER — HYDROMORPHONE HCL PF 1 MG/ML IJ SOLN: 0.2500 mg | INTRAMUSCULAR | Status: DC | PRN

## 2012-02-03 MED ORDER — ONDANSETRON HCL 4 MG/2ML IJ SOLN
INTRAMUSCULAR | Status: DC | PRN
  Administered 2012-02-03: 4 mg via INTRAVENOUS

## 2012-02-03 MED ORDER — MIDAZOLAM HCL 5 MG/5ML IJ SOLN
INTRAMUSCULAR | Status: DC | PRN
  Administered 2012-02-03: 1 mg via INTRAVENOUS

## 2012-02-03 MED ORDER — DEXAMETHASONE SODIUM PHOSPHATE 4 MG/ML IJ SOLN
INTRAMUSCULAR | Status: DC | PRN
  Administered 2012-02-03: 10 mg via INTRAVENOUS

## 2012-02-03 MED ORDER — HYDROCODONE-ACETAMINOPHEN 5-325 MG PO TABS: 1.0000 | ORAL_TABLET | ORAL | Status: AC | PRN

## 2012-02-03 MED ORDER — ACETAMINOPHEN 10 MG/ML IV SOLN
INTRAVENOUS | Status: DC | PRN
  Administered 2012-02-03: 1000 mg via INTRAVENOUS

## 2012-02-03 MED ORDER — LIDOCAINE HCL (CARDIAC) 20 MG/ML IV SOLN
INTRAVENOUS | Status: DC | PRN
  Administered 2012-02-03: 60 mg via INTRAVENOUS

## 2012-02-03 SURGICAL SUPPLY — 47 items
APL SKNCLS STERI-STRIP NONHPOA (GAUZE/BANDAGES/DRESSINGS) ×1
APPLIER CLIP 9.375 MED OPEN (MISCELLANEOUS) ×2
APR CLP MED 9.3 20 MLT OPN (MISCELLANEOUS) ×1
BENZOIN TINCTURE PRP APPL 2/3 (GAUZE/BANDAGES/DRESSINGS) ×2 IMPLANT
BLADE HEX COATED 2.75 (ELECTRODE) ×2 IMPLANT
BLADE SURG 15 STRL LF DISP TIS (BLADE) ×1 IMPLANT
BLADE SURG 15 STRL SS (BLADE) ×1
CANISTER SUCTION 1200CC (MISCELLANEOUS) ×2 IMPLANT
CHLORAPREP W/TINT 26ML (MISCELLANEOUS) ×2 IMPLANT
CLIP APPLIE 9.375 MED OPEN (MISCELLANEOUS) ×1 IMPLANT
CLIP TI WIDE RED SMALL 6 (CLIP) IMPLANT
CLOTH BEACON ORANGE TIMEOUT ST (SAFETY) ×2 IMPLANT
COVER MAYO STAND STRL (DRAPES) ×2 IMPLANT
COVER TABLE BACK 60X90 (DRAPES) ×2 IMPLANT
DECANTER SPIKE VIAL GLASS SM (MISCELLANEOUS) IMPLANT
DEVICE DUBIN W/COMP PLATE 8390 (MISCELLANEOUS) ×2 IMPLANT
DRAPE PED LAPAROTOMY (DRAPES) ×2 IMPLANT
DRAPE UTILITY XL STRL (DRAPES) ×2 IMPLANT
DRSG TEGADERM 4X4.75 (GAUZE/BANDAGES/DRESSINGS) ×2 IMPLANT
ELECT REM PT RETURN 9FT ADLT (ELECTROSURGICAL) ×2
ELECTRODE REM PT RTRN 9FT ADLT (ELECTROSURGICAL) ×1 IMPLANT
GAUZE SPONGE 4X4 12PLY STRL LF (GAUZE/BANDAGES/DRESSINGS) ×2 IMPLANT
GLOVE BIO SURGEON STRL SZ7 (GLOVE) ×2 IMPLANT
GLOVE BIOGEL PI IND STRL 7.0 (GLOVE) ×1 IMPLANT
GLOVE BIOGEL PI INDICATOR 7.0 (GLOVE) ×1
GLOVE SURG SIGNA 7.5 PF LTX (GLOVE) ×2 IMPLANT
GOWN PREVENTION PLUS XLARGE (GOWN DISPOSABLE) ×2 IMPLANT
GOWN PREVENTION PLUS XXLARGE (GOWN DISPOSABLE) ×2 IMPLANT
KIT MARKER MARGIN INK (KITS) ×2 IMPLANT
NEEDLE HYPO 25X1 1.5 SAFETY (NEEDLE) ×2 IMPLANT
NS IRRIG 1000ML POUR BTL (IV SOLUTION) ×2 IMPLANT
PACK BASIN DAY SURGERY FS (CUSTOM PROCEDURE TRAY) ×2 IMPLANT
PENCIL BUTTON HOLSTER BLD 10FT (ELECTRODE) ×2 IMPLANT
SLEEVE SCD COMPRESS KNEE MED (MISCELLANEOUS) ×2 IMPLANT
SPONGE GAUZE 2X2 8PLY STRL LF (GAUZE/BANDAGES/DRESSINGS) ×2 IMPLANT
SPONGE LAP 4X18 X RAY DECT (DISPOSABLE) ×2 IMPLANT
STRIP CLOSURE SKIN 1/2X4 (GAUZE/BANDAGES/DRESSINGS) ×2 IMPLANT
SUT MNCRL AB 4-0 PS2 18 (SUTURE) ×2 IMPLANT
SUT SILK 2 0 SH (SUTURE) ×2 IMPLANT
SUT VIC AB 3-0 SH 27 (SUTURE) ×2
SUT VIC AB 3-0 SH 27X BRD (SUTURE) ×1 IMPLANT
SYR CONTROL 10ML LL (SYRINGE) ×2 IMPLANT
TOWEL OR 17X24 6PK STRL BLUE (TOWEL DISPOSABLE) ×2 IMPLANT
TOWEL OR NON WOVEN STRL DISP B (DISPOSABLE) ×2 IMPLANT
TUBE CONNECTING 20X1/4 (TUBING) ×2 IMPLANT
WATER STERILE IRR 1000ML POUR (IV SOLUTION) IMPLANT
YANKAUER SUCT BULB TIP NO VENT (SUCTIONS) ×2 IMPLANT

## 2012-02-03 NOTE — Interval H&P Note (Signed)
History and Physical Interval Note: no change in H and P  02/03/2012 11:11 AM  Brittney Wright  has presented today for surgery, with the diagnosis of left breast cancer  The various methods of treatment have been discussed with the patient and family. After consideration of risks, benefits and other options for treatment, the patient has consented to  Procedure(s) (LRB): BREAST LUMPECTOMY WITH NEEDLE LOCALIZATION (Left) as a surgical intervention .  The patient's history has been reviewed, patient examined, no change in status, stable for surgery.  I have reviewed the patient's chart and labs.  Questions were answered to the patient's satisfaction.     Deondrea Aguado A

## 2012-02-03 NOTE — Anesthesia Postprocedure Evaluation (Signed)
  Anesthesia Post-op Note  Patient: Brittney Wright  Procedure(s) Performed: Procedure(s) (LRB): BREAST LUMPECTOMY WITH NEEDLE LOCALIZATION (Left)  Patient Location: PACU  Anesthesia Type: General  Level of Consciousness: awake  Airway and Oxygen Therapy: Patient Spontanous Breathing  Post-op Pain: mild  Post-op Assessment: Post-op Vital signs reviewed  Post-op Vital Signs: Reviewed  Complications: No apparent anesthesia complications

## 2012-02-03 NOTE — Transfer of Care (Signed)
Immediate Anesthesia Transfer of Care Note  Patient: Brittney Wright  Procedure(s) Performed: Procedure(s) (LRB): BREAST LUMPECTOMY WITH NEEDLE LOCALIZATION (Left)  Patient Location: PACU  Anesthesia Type: General  Level of Consciousness: awake, alert , oriented and patient cooperative  Airway & Oxygen Therapy: Patient Spontanous Breathing and Patient connected to face mask oxygen  Post-op Assessment: Report given to PACU RN and Post -op Vital signs reviewed and stable  Post vital signs: Reviewed and stable  Complications: No apparent anesthesia complications

## 2012-02-03 NOTE — Anesthesia Procedure Notes (Signed)
Procedure Name: LMA Insertion Date/Time: 02/03/2012 11:41 AM Performed by: Denny Levy Pre-anesthesia Checklist: Patient identified, Emergency Drugs available, Suction available, Patient being monitored and Timeout performed Patient Re-evaluated:Patient Re-evaluated prior to inductionOxygen Delivery Method: Circle System Utilized Preoxygenation: Pre-oxygenation with 100% oxygen Intubation Type: IV induction Ventilation: Mask ventilation without difficulty LMA: LMA inserted LMA Size: 4.0 Number of attempts: 1 Placement Confirmation: positive ETCO2 Tube secured with: Tape Dental Injury: Teeth and Oropharynx as per pre-operative assessment

## 2012-02-03 NOTE — Op Note (Signed)
BREAST LUMPECTOMY WITH NEEDLE LOCALIZATION  Procedure Note  Brittney Wright 02/03/2012   Pre-op Diagnosis: left breast DCIS     Post-op Diagnosis: same  Procedure(s): LEFT BREAST LUMPECTOMY WITH NEEDLE LOCALIZATION  Surgeon(s): Harl Bowie, MD  Anesthesia: General  Staff:  Eli Phillips, RN - Circulator Levora Angel, RN - Scrub Person  Estimated Blood Loss: Minimal               Specimens: suspicious area confirmed by x-ray to be in specimen, sent to path         Indications: This is a 77 year old female with ductal carcinoma in situ of the left breast. The decision has been made to proceed with needle localized left breast lumpectomy  Procedure: The patient was brought to the operating room and placed in a supine position on the operating room table. She had already  had the localization wire placed at the breast center in the left breast.  She was again identified as the correct patient. Gen. Anesthesia was then induced. Her left breast was then prepped and draped in the usual sterile fashion. I anesthetized the skin around the wire of Marcaine.  I performed an incision around the wire with the scalpel. I took the incision down into the breast tissue with the electrocautery. I then performed a wide lobectomy going down to the chest wall. Wide negative margins appeared to be achieved. The specimen was x-rayed and again was confirmed that the abnormal area was in the lumpectomy specimen. It was sent to pathology for evaluation. I placed surgical clips around the periphery of the lumpectomy site. Hemostasis appeared to be achieved. I then anesthetized the skin and tissue further with Marcaine. I closed the subcutaneous tissue with interrupted 3-0 Vicryl sutures and closed the skin with a running 4-0 Monocryl. Steri-Strips, gauze, and Tegaderm were then applied. The patient tolerated it well. All counts were correct at the end of the procedure. The patient was extubated in  the operating room and taken in stable condition to the recovery room. [name]   Date: 02/03/2012  Time: 12:13 PM

## 2012-02-03 NOTE — Anesthesia Preprocedure Evaluation (Signed)
Anesthesia Evaluation  Patient identified by MRN, date of birth, ID band Patient awake    Reviewed: Allergy & Precautions, H&P , NPO status , Patient's Chart, lab work & pertinent test results  Airway Mallampati: II      Dental   Pulmonary shortness of breath and with exertion,  breath sounds clear to auscultation        Cardiovascular hypertension, Pt. on medications Rhythm:Regular Rate:Normal     Neuro/Psych    GI/Hepatic Neg liver ROS,   Endo/Other  Type 2  Renal/GU History of renal disease followed by doctor.     Musculoskeletal   Abdominal   Peds  Hematology negative hematology ROS (+)   Anesthesia Other Findings   Reproductive/Obstetrics                           Anesthesia Physical Anesthesia Plan  ASA: III  Anesthesia Plan: General   Post-op Pain Management:    Induction: Intravenous  Airway Management Planned: LMA  Additional Equipment:   Intra-op Plan:   Post-operative Plan: Extubation in OR  Informed Consent: I have reviewed the patients History and Physical, chart, labs and discussed the procedure including the risks, benefits and alternatives for the proposed anesthesia with the patient or authorized representative who has indicated his/her understanding and acceptance.   Dental advisory given  Plan Discussed with: CRNA, Anesthesiologist and Surgeon  Anesthesia Plan Comments:         Anesthesia Quick Evaluation

## 2012-02-14 ENCOUNTER — Ambulatory Visit (INDEPENDENT_AMBULATORY_CARE_PROVIDER_SITE_OTHER): Payer: Medicare Other | Admitting: Surgery

## 2012-02-14 ENCOUNTER — Encounter (INDEPENDENT_AMBULATORY_CARE_PROVIDER_SITE_OTHER): Payer: Self-pay | Admitting: Surgery

## 2012-02-14 VITALS — BP 114/60 | HR 100 | Temp 97.8°F | Ht 65.0 in | Wt 197.0 lb

## 2012-02-14 DIAGNOSIS — Z09 Encounter for follow-up examination after completed treatment for conditions other than malignant neoplasm: Secondary | ICD-10-CM

## 2012-02-14 NOTE — Progress Notes (Signed)
Subjective:     Patient ID: Brittney Wright, female   DOB: 05/25/1935, 77 y.o.   MRN: [id number]  HPI She is here for first postop visit status post left breast needle localized lumpectomy for ductal carcinoma in situ. She is doing well and has no complaints.  Review of Systems     Objective:   Physical Exam On exam, her incision is well healed the left breast. Final pathology showed no invasive disease and margins were negative    Assessment:     Patient stable postop    Plan:     She will be following up with the oncologist. She may return to normal activity. I will see her back in 6 months

## 2012-02-28 ENCOUNTER — Ambulatory Visit: Payer: Medicare Other | Admitting: Oncology

## 2012-02-28 ENCOUNTER — Other Ambulatory Visit: Payer: Medicare Other | Admitting: Lab

## 2012-02-29 ENCOUNTER — Other Ambulatory Visit: Payer: Self-pay | Admitting: Family

## 2012-02-29 DIAGNOSIS — C50419 Malignant neoplasm of upper-outer quadrant of unspecified female breast: Secondary | ICD-10-CM

## 2012-02-29 DIAGNOSIS — D649 Anemia, unspecified: Secondary | ICD-10-CM

## 2012-03-01 ENCOUNTER — Other Ambulatory Visit (HOSPITAL_BASED_OUTPATIENT_CLINIC_OR_DEPARTMENT_OTHER): Payer: Medicare Other | Admitting: Lab

## 2012-03-01 ENCOUNTER — Encounter: Payer: Self-pay | Admitting: Radiation Oncology

## 2012-03-01 ENCOUNTER — Ambulatory Visit (HOSPITAL_BASED_OUTPATIENT_CLINIC_OR_DEPARTMENT_OTHER): Payer: Medicare Other | Admitting: Oncology

## 2012-03-01 ENCOUNTER — Telehealth: Payer: Self-pay | Admitting: *Deleted

## 2012-03-01 VITALS — BP 134/77 | HR 83 | Temp 98.4°F | Resp 20 | Ht 65.0 in | Wt 194.8 lb

## 2012-03-01 DIAGNOSIS — C50419 Malignant neoplasm of upper-outer quadrant of unspecified female breast: Secondary | ICD-10-CM

## 2012-03-01 DIAGNOSIS — D0590 Unspecified type of carcinoma in situ of unspecified breast: Secondary | ICD-10-CM

## 2012-03-01 DIAGNOSIS — D649 Anemia, unspecified: Secondary | ICD-10-CM

## 2012-03-01 LAB — CBC WITH DIFFERENTIAL/PLATELET
BASO%: 0.4 % (ref 0.0–2.0)
EOS%: 3.8 % (ref 0.0–7.0)
LYMPH%: 23.6 % (ref 14.0–49.7)
MCH: 29 pg (ref 25.1–34.0)
MCHC: 31.9 g/dL (ref 31.5–36.0)
MCV: 91.2 fL (ref 79.5–101.0)
MONO%: 8.7 % (ref 0.0–14.0)
Platelets: 273 10*3/uL (ref 145–400)
RBC: 3.96 10*6/uL (ref 3.70–5.45)
RDW: 12.7 % (ref 11.2–14.5)

## 2012-03-01 NOTE — Progress Notes (Signed)
Hematology and Oncology Follow Up Visit  Brittney Wright [id number] 01-Nov-1934 77 y.o. 03/01/2012 11:19 AM   DIAGNOSIS:   ER/PR positive DCIS status post lumpectomy 02/03/2012.  PAST THERAPY:  None  Interim History:   Lumpectomy as noted patient recovered well from surgery. Medications: I have reviewed the patient's current medications.  Allergies:  Allergies  Allergen Reactions  . Minocycline     Past Medical History, Surgical history, Social history, and Family History were reviewed and updated.  Review of Systems: Constitutional:  Negative for fever, chills, night sweats, anorexia, weight loss, pain. Cardiovascular: negative Respiratory: negative Neurological: negative Dermatological: negative ENT: negative Skin Gastrointestinal: negative Genito-Urinary: negative Hematological and Lymphatic: negative Breast: negative Musculoskeletal: negative Remaining ROS negative.  Physical Exam:  Blood pressure 134/77, pulse 83, temperature 98.4 F (36.9 C), temperature source Oral, resp. rate 20, height 5\' 5"  (1.651 m), weight 194 lb 12.8 oz (88.361 kg).  ECOG: 0      Lab Results: Lab Results  Component Value Date   WBC 7.4 03/01/2012   HGB 11.5* 03/01/2012   HCT 36.1 03/01/2012   MCV 91.2 03/01/2012   PLT 273 03/01/2012     Chemistry      Component Value Date/Time   NA 137 01/31/2012 1030   K 4.9 01/31/2012 1030   CL 103 01/31/2012 1030   CO2 24 01/31/2012 1030   BUN 36* 01/31/2012 1030   CREATININE 1.54* 01/31/2012 1030      Component Value Date/Time   CALCIUM 9.5 01/31/2012 1030   ALKPHOS 59 01/18/2012 0846   AST 22 01/18/2012 0846   ALT 21 01/18/2012 0846   BILITOT 0.3 01/18/2012 0846       Radiological Studies:  Mm Breast Surgical Specimen  02/03/2012  *RADIOLOGY REPORT*  Clinical Data:  Personal history of left breast cancer for left breast surgery  NEEDLE LOCALIZATION WITH MAMMOGRAPHIC GUIDANCE AND SPECIMEN RADIOGRAPH  Comparison:  Previous exams.   Patient presents for needle localization prior to left breast surgery.  I met with the patient and we discussed the procedure of needle localization including benefits and alternatives. We discussed the high likelihood of a successful procedure. We discussed the risks of the procedure, including infection, bleeding, tissue injury, and further surgery. Informed, written consent was given.  Using mammographic guidance, sterile technique, 2% lidocaine and a 7 cm modified Kopans needle, the mass with clip in the left breast was localized using a cranial approach.  The films are marked for Dr. Ninfa Linden.  Specimen radiograph was performed at day surgery, and confirms mass, clip, wire present in the tissue sample.  The specimen is marked for pathology.  IMPRESSION: Needle localization left breast.  No apparent complications.  Original Report Authenticated By: Abelardo Diesel, M.D.   Mm Breast Wire Localization Left  02/03/2012  *RADIOLOGY REPORT*  Clinical Data:  Personal history of left breast cancer for left breast surgery  NEEDLE LOCALIZATION WITH MAMMOGRAPHIC GUIDANCE AND SPECIMEN RADIOGRAPH  Comparison:  Previous exams.  Patient presents for needle localization prior to left breast surgery.  I met with the patient and we discussed the procedure of needle localization including benefits and alternatives. We discussed the high likelihood of a successful procedure. We discussed the risks of the procedure, including infection, bleeding, tissue injury, and further surgery. Informed, written consent was given.  Using mammographic guidance, sterile technique, 2% lidocaine and a 7 cm modified Kopans needle, the mass with clip in the left breast was localized using a cranial approach.  The films  are marked for Dr. Ninfa Linden.  Specimen radiograph was performed at day surgery, and confirms mass, clip, wire present in the tissue sample.  The specimen is marked for pathology.  IMPRESSION: Needle localization left breast.  No  apparent complications.  Original Report Authenticated By: Abelardo Diesel, M.D.     IMPRESSIONS AND PLAN: A 77 y.o. female with   History of ER/PR positive DCIS. Status post lumpectomy. I have asked that the radiation oncology review her pathology to determine whether or not he feels that radiation would not be indicated in this circumstance. As far as adjuvant hormonal therapy is concerned I think that that would be appropriate. We discussed the benefits and side effects of tamoxifen. I've given her prescription for this. I've also referred her back to the radiation oncologist and given instructions that would indicate she should start her tamoxifen after radiation if that is done if not then she can start as soon as possible. I once again I will see her 3 months time. I given the data regarding the use of adjuvant AI therapy in this circumstance as well as an overall she would likely benefit from tamoxifen.  Spent more than half the time coordinating care, as well as discussion of BMI and its implications.      Brittney Wright 9/12/201311:19 AM Cell [id number]

## 2012-03-01 NOTE — Progress Notes (Signed)
Chart note: I spoke with the patient by telephone after she met with Dr. Truddie Coco this morning. I do not feel that she needs postoperative radiation therapy based on her Trena Platt prognostic index. Her score is 5, and patients with a "4, 5, or 6 to be considered for treatment with excision only " according to Dr. Kaylyn Layer. I am giving her literature for review. This assumes that she will receive adjuvant hormone therapy through Dr. Truddie Coco for 5 years. She'll not need to see me for a followup visit next week.

## 2012-03-01 NOTE — Telephone Encounter (Signed)
Gave patient app;ointment for three months with lab and md spoke with karen in dr.murray office to call the patient and set up patient

## 2012-03-06 ENCOUNTER — Ambulatory Visit: Payer: Medicare Other

## 2012-03-06 ENCOUNTER — Ambulatory Visit: Payer: Medicare Other | Admitting: Radiation Oncology

## 2012-03-12 ENCOUNTER — Encounter: Payer: Self-pay | Admitting: Radiation Oncology

## 2012-03-12 NOTE — Progress Notes (Signed)
Chart note: Ms. Gogue called me back this morning after reviewing the literature that I gave her. She is quite concerned about having a close margin and insists on having radiation therapy. I told her that her tumor bed could be reexcised, but she wishes not want to consider any further surgery. She understands that radiation therapy will not change her overall survival, but may "minimally" decrease her risk for local failure. She reminded that her family members live well into their 71s, and does not want to be "shortchanged" because of her age of 89. Therefore, we will have her visit [place] next week for simulation/treatment planning. I instructed her to hold her tamoxifen until she completes radiation therapy. We will try to give her hypo-fractionated radiation therapy. She may be a candidate for an deep inspiration/breath-hold technology to avoid cardiac irradiation if necessary.

## 2012-03-20 ENCOUNTER — Ambulatory Visit
Admission: RE | Admit: 2012-03-20 | Discharge: 2012-03-20 | Disposition: A | Discharge status: Home / Self Care | Payer: Medicare Other | Source: Ambulatory Visit | Attending: Radiation Oncology | Admitting: Radiation Oncology

## 2012-03-20 DIAGNOSIS — C50419 Malignant neoplasm of upper-outer quadrant of unspecified female breast: Secondary | ICD-10-CM

## 2012-03-20 DIAGNOSIS — D0590 Unspecified type of carcinoma in situ of unspecified breast: Secondary | ICD-10-CM | POA: Insufficient documentation

## 2012-03-20 DIAGNOSIS — Z510 Encounter for antineoplastic radiation therapy: Secondary | ICD-10-CM | POA: Insufficient documentation

## 2012-03-20 NOTE — Progress Notes (Signed)
Simulation/treatment planning note: The patient underwent simulation/treatment planning in the management of her DCIS of left breast. She is placed on a breast board with a custom neck mold for immobilization. Her field borders and left partial mastectomy scar were marked with radiopaque wires. She was then scanned. With a free breathing scan there was no concern about irradiation of her cardiac silhouette, and thus it is not felt that she needed deep inspiration/breath-hold. I contoured her tumor bed, and she also had contouring of her left heart in normal surrounding anatomy. She was set up to medial and lateral breast tangents. 2 sets of multileaf collimators were designed for a total of 3 complex treatment devices. Dosimetry and an isodose plan are requested. I prescribing 4250 cGy in 17 sessions. This be followed by electron beam boost for a further 750 cGy 3 sessions utilizing 15 MEV electrons.

## 2012-03-27 ENCOUNTER — Ambulatory Visit
Admission: RE | Admit: 2012-03-27 | Discharge: 2012-03-27 | Disposition: A | Discharge status: Home / Self Care | Payer: Medicare Other | Source: Ambulatory Visit | Attending: Radiation Oncology | Admitting: Radiation Oncology

## 2012-03-27 DIAGNOSIS — C50419 Malignant neoplasm of upper-outer quadrant of unspecified female breast: Secondary | ICD-10-CM

## 2012-03-27 NOTE — Progress Notes (Signed)
Simulation verification note: The patient underwent simulation verification for treatment to her left breast. Her isocenter is in good position and the multileaf collimators contoured the treatment volume appropriately. 

## 2012-03-28 ENCOUNTER — Ambulatory Visit
Admission: RE | Admit: 2012-03-28 | Discharge: 2012-03-28 | Disposition: A | Discharge status: Home / Self Care | Payer: Medicare Other | Source: Ambulatory Visit | Attending: Radiation Oncology | Admitting: Radiation Oncology

## 2012-03-29 ENCOUNTER — Ambulatory Visit
Admission: RE | Admit: 2012-03-29 | Discharge: 2012-03-29 | Disposition: A | Discharge status: Home / Self Care | Payer: Medicare Other | Source: Ambulatory Visit | Attending: Radiation Oncology | Admitting: Radiation Oncology

## 2012-03-30 ENCOUNTER — Ambulatory Visit
Admission: RE | Admit: 2012-03-30 | Discharge: 2012-03-30 | Disposition: A | Discharge status: Home / Self Care | Payer: Medicare Other | Source: Ambulatory Visit | Attending: Radiation Oncology | Admitting: Radiation Oncology

## 2012-04-02 ENCOUNTER — Encounter: Payer: Self-pay | Admitting: Radiation Oncology

## 2012-04-02 ENCOUNTER — Ambulatory Visit
Admission: RE | Admit: 2012-04-02 | Discharge: 2012-04-02 | Disposition: A | Discharge status: Home / Self Care | Payer: Medicare Other | Source: Ambulatory Visit | Attending: Radiation Oncology | Admitting: Radiation Oncology

## 2012-04-02 VITALS — BP 131/71 | HR 78 | Temp 97.8°F | Resp 20 | Wt 193.7 lb

## 2012-04-02 DIAGNOSIS — C50419 Malignant neoplasm of upper-outer quadrant of unspecified female breast: Secondary | ICD-10-CM

## 2012-04-02 MED ORDER — ALRA NON-METALLIC DEODORANT (RAD-ONC)
1.0000 "application " | Freq: Once | TOPICAL | Status: AC
  Administered 2012-04-02: 1 via TOPICAL

## 2012-04-02 MED ORDER — RADIAPLEXRX EX GEL
Freq: Once | CUTANEOUS | Status: AC
  Administered 2012-04-02: 12:00:00 via TOPICAL

## 2012-04-02 NOTE — Progress Notes (Signed)
weekly rad txs left breast 4/20compleetd, post simmed, radiation therapy and you book, skin products,radiaplex gel and alra deodorant given with instructions to patien, no skin changes so far , no pain, patient will not be here tomorrow going to aunts funeral in [place], gave my business car also to patient, teach back given 12:12 PM

## 2012-04-02 NOTE — Progress Notes (Signed)
Weekly Management Note:  Site: Left breast  Current Dose:  1000  cGy Projected Dose: 5000  cGy  Narrative: The patient is seen today for routine under treatment assessment. CBCT/MVCT images/port films were reviewed. The chart was reviewed.   No complaints today. She will be out of town tomorrow to attend a funeral in [place]. She inquires about when to start her tamoxifen, prescribed by Dr. Truddie Coco. I told her that she start tamoxifen after completion of radiation therapy. She will apparently see Dr. Truddie Coco again in December. She had patient education earlier today and will start Radioplex gel.  Physical Examination:  Filed Vitals:   04/02/12 1208  BP: 131/71  Pulse: 78  Temp: 97.8 F (36.6 C)  Resp: 20  .  Weight: 193 lb 11.2 oz (87.862 kg). No significant skin changes  Impression: Tolerating radiation therapy well.  Plan: Continue radiation therapy as planned.

## 2012-04-03 ENCOUNTER — Ambulatory Visit: Payer: Medicare Other

## 2012-04-04 ENCOUNTER — Ambulatory Visit
Admission: RE | Admit: 2012-04-04 | Discharge: 2012-04-04 | Disposition: A | Discharge status: Home / Self Care | Payer: Medicare Other | Source: Ambulatory Visit | Attending: Radiation Oncology | Admitting: Radiation Oncology

## 2012-04-05 ENCOUNTER — Ambulatory Visit
Admission: RE | Admit: 2012-04-05 | Discharge: 2012-04-05 | Disposition: A | Discharge status: Home / Self Care | Payer: Medicare Other | Source: Ambulatory Visit | Attending: Radiation Oncology | Admitting: Radiation Oncology

## 2012-04-06 ENCOUNTER — Ambulatory Visit
Admission: RE | Admit: 2012-04-06 | Discharge: 2012-04-06 | Disposition: A | Discharge status: Home / Self Care | Payer: Medicare Other | Source: Ambulatory Visit | Attending: Radiation Oncology | Admitting: Radiation Oncology

## 2012-04-09 ENCOUNTER — Ambulatory Visit
Admission: RE | Admit: 2012-04-09 | Discharge: 2012-04-09 | Disposition: A | Discharge status: Home / Self Care | Payer: Medicare Other | Source: Ambulatory Visit | Attending: Radiation Oncology | Admitting: Radiation Oncology

## 2012-04-09 ENCOUNTER — Encounter: Payer: Self-pay | Admitting: Radiation Oncology

## 2012-04-09 ENCOUNTER — Ambulatory Visit: Admission: RE | Admit: 2012-04-09 | Payer: Medicare Other | Source: Ambulatory Visit | Admitting: Radiation Oncology

## 2012-04-09 VITALS — BP 136/64 | HR 79 | Resp 20 | Wt 193.4 lb

## 2012-04-09 DIAGNOSIS — C50419 Malignant neoplasm of upper-outer quadrant of unspecified female breast: Secondary | ICD-10-CM

## 2012-04-09 NOTE — Progress Notes (Signed)
Patient here weekly rad txs left breast 8/20 completed, no skin changes noted,  Using radiplex gel bid, occasional twinges in breast no fatigue stated. 11:59 AM

## 2012-04-09 NOTE — Progress Notes (Signed)
Weekly Management Note:  Site: Left breast Current Dose:  2000  cGy Projected Dose: Wife that  cGy  Narrative: The patient is seen today for routine under treatment assessment. CBCT/MVCT images/port films were reviewed. The chart was reviewed.   She is without complaints today. She uses Radioplex gel.  Physical Examination:  Filed Vitals:   04/09/12 1156  BP: 136/64  Pulse: 79  Resp: 20  .  Weight: 193 lb 6.4 oz (87.726 kg). Faint erythema the skin along the left breast. No areas of desquamation.  Impression: Tolerating radiation therapy well.  Plan: Continue radiation therapy as planned.

## 2012-04-10 ENCOUNTER — Ambulatory Visit
Admission: RE | Admit: 2012-04-10 | Discharge: 2012-04-10 | Disposition: A | Discharge status: Home / Self Care | Payer: Medicare Other | Source: Ambulatory Visit | Attending: Radiation Oncology | Admitting: Radiation Oncology

## 2012-04-11 ENCOUNTER — Ambulatory Visit
Admission: RE | Admit: 2012-04-11 | Discharge: 2012-04-11 | Disposition: A | Discharge status: Home / Self Care | Payer: Medicare Other | Source: Ambulatory Visit | Attending: Radiation Oncology | Admitting: Radiation Oncology

## 2012-04-11 ENCOUNTER — Ambulatory Visit: Payer: Medicare Other

## 2012-04-12 ENCOUNTER — Ambulatory Visit: Payer: Medicare Other

## 2012-04-12 ENCOUNTER — Ambulatory Visit: Admission: RE | Admit: 2012-04-12 | Payer: Medicare Other | Source: Ambulatory Visit

## 2012-04-13 ENCOUNTER — Ambulatory Visit: Payer: Medicare Other

## 2012-04-13 ENCOUNTER — Ambulatory Visit
Admission: RE | Admit: 2012-04-13 | Discharge: 2012-04-13 | Disposition: A | Discharge status: Home / Self Care | Payer: Medicare Other | Source: Ambulatory Visit | Attending: Radiation Oncology | Admitting: Radiation Oncology

## 2012-04-16 ENCOUNTER — Ambulatory Visit
Admission: RE | Admit: 2012-04-16 | Discharge: 2012-04-16 | Disposition: A | Discharge status: Home / Self Care | Payer: Medicare Other | Source: Ambulatory Visit | Attending: Radiation Oncology | Admitting: Radiation Oncology

## 2012-04-16 ENCOUNTER — Ambulatory Visit: Payer: Medicare Other

## 2012-04-16 VITALS — BP 133/60 | HR 83 | Temp 97.7°F | Wt 191.6 lb

## 2012-04-16 DIAGNOSIS — C50419 Malignant neoplasm of upper-outer quadrant of unspecified female breast: Secondary | ICD-10-CM

## 2012-04-16 MED ORDER — BIAFINE EX EMUL
CUTANEOUS | Status: DC | PRN
  Administered 2012-04-16: 1 via TOPICAL

## 2012-04-16 NOTE — Progress Notes (Signed)
Patient here for routine weekly under treat visit for left breast cancer.Completed 12 of 20 treatments.Doing very well.No visible skin changes.Denies pain.No medication changes except dosage on januvia.Will bring in dosage with in the week.

## 2012-04-16 NOTE — Progress Notes (Signed)
Weekly Management Note:  Site: Left breast Current Dose:  3000  cGy Projected Dose: 5000  cGy  Narrative: The patient is seen today for routine under treatment assessment. CBCT/MVCT images/port films were reviewed. The chart was reviewed.   She is doing well. No specific complaints. She uses Radioplex gel.  Physical Examination:  Filed Vitals:   04/16/12 1214  BP: 133/60  Pulse: 83  Temp: 97.7 F (36.5 C)  .  Weight: 191 lb 9.6 oz (86.909 kg). There is mild erythema the skin with no areas of desquamation.  Impression: Tolerating radiation therapy well.  Plan: Continue radiation therapy as planned.

## 2012-04-17 ENCOUNTER — Ambulatory Visit
Admission: RE | Admit: 2012-04-17 | Discharge: 2012-04-17 | Disposition: A | Discharge status: Home / Self Care | Payer: Medicare Other | Source: Ambulatory Visit | Attending: Radiation Oncology | Admitting: Radiation Oncology

## 2012-04-17 ENCOUNTER — Encounter: Payer: Self-pay | Admitting: Radiation Oncology

## 2012-04-17 ENCOUNTER — Ambulatory Visit: Payer: Medicare Other

## 2012-04-17 NOTE — Progress Notes (Signed)
Complex simulation note: The patient underwent virtual simulation for her left breast electron beam boost. She was set up en face. One custom block was constructed to conform the field. A special port plan is requested. I prescribing 750 cGy 3 sessions utilizing 15 MEV electrons. The electron beam energy was chosen based on the depth of her tumor bed as seen on her CT scan.

## 2012-04-18 ENCOUNTER — Encounter: Payer: Self-pay | Admitting: *Deleted

## 2012-04-18 ENCOUNTER — Ambulatory Visit
Admission: RE | Admit: 2012-04-18 | Discharge: 2012-04-18 | Disposition: A | Discharge status: Home / Self Care | Payer: Medicare Other | Source: Ambulatory Visit | Attending: Radiation Oncology | Admitting: Radiation Oncology

## 2012-04-18 ENCOUNTER — Ambulatory Visit: Payer: Medicare Other

## 2012-04-18 NOTE — Progress Notes (Signed)
Patient came to nursing  Stated"I notice some slight  swelling under my arm left breast side, no c/o pain, using radiaplex gel bid, noticed a little swelling, under axilla  Soft, she  will see Md Monday after treatment unless swelling increases then patient to come back to let MD check,patient gave verbal understanding

## 2012-04-19 ENCOUNTER — Ambulatory Visit: Payer: Medicare Other

## 2012-04-19 ENCOUNTER — Ambulatory Visit
Admission: RE | Admit: 2012-04-19 | Discharge: 2012-04-19 | Disposition: A | Discharge status: Home / Self Care | Payer: Medicare Other | Source: Ambulatory Visit | Attending: Radiation Oncology | Admitting: Radiation Oncology

## 2012-04-20 ENCOUNTER — Ambulatory Visit: Payer: Medicare Other

## 2012-04-20 ENCOUNTER — Ambulatory Visit
Admission: RE | Admit: 2012-04-20 | Discharge: 2012-04-20 | Disposition: A | Discharge status: Home / Self Care | Payer: Medicare Other | Source: Ambulatory Visit | Attending: Radiation Oncology | Admitting: Radiation Oncology

## 2012-04-23 ENCOUNTER — Ambulatory Visit
Admission: RE | Admit: 2012-04-23 | Discharge: 2012-04-23 | Disposition: A | Discharge status: Home / Self Care | Payer: Medicare Other | Source: Ambulatory Visit | Attending: Radiation Oncology | Admitting: Radiation Oncology

## 2012-04-23 ENCOUNTER — Ambulatory Visit: Payer: Medicare Other

## 2012-04-23 ENCOUNTER — Encounter: Payer: Self-pay | Admitting: Radiation Oncology

## 2012-04-23 VITALS — BP 137/58 | HR 76 | Temp 98.0°F | Resp 20 | Wt 191.8 lb

## 2012-04-23 DIAGNOSIS — C50419 Malignant neoplasm of upper-outer quadrant of unspecified female breast: Secondary | ICD-10-CM

## 2012-04-23 MED ORDER — RADIAPLEXRX EX GEL
Freq: Once | CUTANEOUS | Status: AC
  Administered 2012-04-23: 12:00:00 via TOPICAL

## 2012-04-23 NOTE — Progress Notes (Signed)
Patient here for weekly rad txs:17/20 completed, very slight erythema left breast/axilla, slight swelling axilla/breast area, , not increased  friom Friday, no pian, patient hust fatigued, slept a lot yesterday,eating and drinking well, sore throat, sinus drainage ,patiwent requesting otc allergy medication 11:57 AM

## 2012-04-23 NOTE — Progress Notes (Signed)
Weekly Management Note:  Site: Left breast Current Dose:  4250  cGy Projected Dose: 5000  cGy  Narrative: The patient is seen today for routine under treatment assessment. CBCT/MVCT images/port films were reviewed. The chart was reviewed.   No new complaints today. She uses Radioplex gel. She is more fatigued and she could assist to her seasonal allergies.  Physical Examination:  Filed Vitals:   04/23/12 1154  BP: 137/58  Pulse: 76  Temp: 98 F (36.7 C)  Resp: 20  .  Weight: 191 lb 12.8 oz (87 kg). There is erythema the skin along the left breast with patchy dry desquamation along her lower axilla.  Impression: Tolerating radiation therapy well.  Plan: Continue radiation therapy as planned. She'll finish her treatment this Thursday. She will see me back for a one-month followup visit.

## 2012-04-23 NOTE — Progress Notes (Signed)
Chart note: I checked her left breast electron beam setup today.

## 2012-04-24 ENCOUNTER — Ambulatory Visit: Payer: Medicare Other

## 2012-04-24 ENCOUNTER — Ambulatory Visit
Admission: RE | Admit: 2012-04-24 | Discharge: 2012-04-24 | Disposition: A | Discharge status: Home / Self Care | Payer: Medicare Other | Source: Ambulatory Visit | Attending: Radiation Oncology | Admitting: Radiation Oncology

## 2012-04-25 ENCOUNTER — Ambulatory Visit
Admission: RE | Admit: 2012-04-25 | Discharge: 2012-04-25 | Disposition: A | Discharge status: Home / Self Care | Payer: Medicare Other | Source: Ambulatory Visit | Attending: Radiation Oncology | Admitting: Radiation Oncology

## 2012-04-26 ENCOUNTER — Encounter: Payer: Self-pay | Admitting: Radiation Oncology

## 2012-04-26 ENCOUNTER — Ambulatory Visit
Admission: RE | Admit: 2012-04-26 | Discharge: 2012-04-26 | Disposition: A | Discharge status: Home / Self Care | Payer: Medicare Other | Source: Ambulatory Visit | Attending: Radiation Oncology | Admitting: Radiation Oncology

## 2012-04-26 NOTE — Progress Notes (Signed)
[facility] End of Treatment Note  Name:Mitra ANIS LEVENSTEIN  Date: 04/26/2012 [id number] DOB:1934/06/30   Status:outpatient    CC: Gennette Pac, MD  Dr. Coralie Keens  REFERRING PHYSICIAN: Dr. 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 Left breast boost 750 cGy 3 sessions.                           BEAMS/ENERGY:   Tangential fields to the left breast, 10 MV photons. 15 MEV electrons, left breast boost. She did not require deep inspiration and breath-hold to avoid cardiac irradiation. Her heart silhouette was away from the tangential fields.                NARRATIVE: The patient tolerated treatment well with mild to moderate radiation dermatitis with dry desquamation by completion of therapy.                            PLAN: Routine followup in one month. Patient instructed to call if questions or worsening complaints in interim.

## 2012-05-11 ENCOUNTER — Telehealth: Payer: Self-pay | Admitting: *Deleted

## 2012-05-11 NOTE — Telephone Encounter (Signed)
left voice message to inform the patient of the new date and time on 06-18-2012 starting at 11:00am

## 2012-05-20 HISTORY — PX: EYE SURGERY: SHX253

## 2012-05-22 ENCOUNTER — Encounter: Payer: Self-pay | Admitting: Radiation Oncology

## 2012-05-22 ENCOUNTER — Ambulatory Visit
Admission: RE | Admit: 2012-05-22 | Discharge: 2012-05-22 | Disposition: A | Discharge status: Home / Self Care | Payer: Medicare Other | Source: Ambulatory Visit | Attending: Radiation Oncology | Admitting: Radiation Oncology

## 2012-05-22 VITALS — BP 142/88 | HR 98 | Temp 98.1°F | Resp 20 | Ht 64.5 in | Wt 191.2 lb

## 2012-05-22 DIAGNOSIS — C50419 Malignant neoplasm of upper-outer quadrant of unspecified female breast: Secondary | ICD-10-CM

## 2012-05-22 NOTE — Progress Notes (Signed)
Patient here  Follow up rad txs left breast:03/28/12-04/26/12,  Alert,oriented x3, still has some fatigue, left breast well healed, states she is having twinges in nipple area"feels like its being pinched,doesn;'t last long"stated by patient, eating well, med list updated  4:03 PM

## 2012-05-22 NOTE — Progress Notes (Signed)
Followup note:  Brittney Wright returns today approximately 1 month following completion of radiation therapy following conservative surgery in the management of her DCIS of the left breast. She still doing well although she does have occasional "twinge pains" along the left nipple lasting a fraction of a second, and these are becoming less frequent. She believes that she does have some residual fatigue from her radiation therapy. She'll see Dr. Truddie Coco on December 30. She continues with her tamoxifen.  Physical examination: Alert and oriented. Wt Readings from Last 3 Encounters:  05/22/12 191 lb 3.2 oz (86.728 kg)  04/23/12 191 lb 12.8 oz (87 kg)  04/16/12 191 lb 9.6 oz (86.909 kg)   Temp Readings from Last 3 Encounters:  05/22/12 98.1 F (36.7 C) Oral  04/23/12 98 F (36.7 C) Oral  04/16/12 97.7 F (36.5 C)    BP Readings from Last 3 Encounters:  05/22/12 142/88  04/23/12 137/58  04/16/12 133/60   Pulse Readings from Last 3 Encounters:  05/22/12 98  04/23/12 76  04/16/12 83    Head and neck examination: Grossly unremarkable. Nodes: Without palpable cervical, supraclavicular, or axillary lymphadenopathy. Chest: Lungs clear.: There is faint residual erythema along the breast with no areas of desquamation. Minimal thickening of the left breast, no masses are appreciated. Right breast without masses or lesions. Extremities: Without edema.  Impression: Satisfactory progress. She has not yet have an appointment to see Dr. Ninfa Linden, but she will see Dr. Truddie Coco on December 30. I think that she can wait until July to have her annual mammography which will serve as a baseline study on the left.  Plan: As above. I've not scheduled the patient for a formal followup visit, but I would be more than happy to see her in the future should the need arise.

## 2012-05-31 ENCOUNTER — Ambulatory Visit: Payer: Medicare Other | Admitting: Oncology

## 2012-05-31 ENCOUNTER — Other Ambulatory Visit: Payer: Medicare Other | Admitting: Lab

## 2012-06-15 ENCOUNTER — Other Ambulatory Visit: Payer: Self-pay | Admitting: *Deleted

## 2012-06-15 DIAGNOSIS — C50419 Malignant neoplasm of upper-outer quadrant of unspecified female breast: Secondary | ICD-10-CM

## 2012-06-18 ENCOUNTER — Telehealth: Payer: Self-pay | Admitting: *Deleted

## 2012-06-18 ENCOUNTER — Other Ambulatory Visit (HOSPITAL_BASED_OUTPATIENT_CLINIC_OR_DEPARTMENT_OTHER): Payer: Medicare Other | Admitting: Lab

## 2012-06-18 ENCOUNTER — Ambulatory Visit (HOSPITAL_BASED_OUTPATIENT_CLINIC_OR_DEPARTMENT_OTHER): Payer: Medicare Other | Admitting: Oncology

## 2012-06-18 VITALS — BP 147/73 | HR 99 | Temp 97.9°F | Resp 20 | Ht 64.5 in | Wt 192.6 lb

## 2012-06-18 DIAGNOSIS — C50419 Malignant neoplasm of upper-outer quadrant of unspecified female breast: Secondary | ICD-10-CM

## 2012-06-18 DIAGNOSIS — D0590 Unspecified type of carcinoma in situ of unspecified breast: Secondary | ICD-10-CM

## 2012-06-18 DIAGNOSIS — Z170 Estrogen receptor positive status [ER+]: Secondary | ICD-10-CM

## 2012-06-18 LAB — COMPREHENSIVE METABOLIC PANEL (CC13)
ALT: 11 U/L (ref 0–55)
AST: 17 U/L (ref 5–34)
Albumin: 3.5 g/dL (ref 3.5–5.0)
Alkaline Phosphatase: 51 U/L (ref 40–150)
Calcium: 8.9 mg/dL (ref 8.4–10.4)
Chloride: 107 mEq/L (ref 98–107)
Potassium: 4.9 mEq/L (ref 3.5–5.1)
Sodium: 139 mEq/L (ref 136–145)
Total Protein: 7 g/dL (ref 6.4–8.3)

## 2012-06-18 LAB — CBC WITH DIFFERENTIAL/PLATELET
BASO%: 1.1 % (ref 0.0–2.0)
LYMPH%: 23.2 % (ref 14.0–49.7)
MCHC: 33 g/dL (ref 31.5–36.0)
MONO#: 0.5 10*3/uL (ref 0.1–0.9)
Platelets: 231 10*3/uL (ref 145–400)
RBC: 3.77 10*6/uL (ref 3.70–5.45)
RDW: 12.7 % (ref 11.2–14.5)
WBC: 4.8 10*3/uL (ref 3.9–10.3)

## 2012-06-18 NOTE — Telephone Encounter (Signed)
Gave patient appointment for getting the 2014 appointment

## 2012-06-18 NOTE — Progress Notes (Signed)
Hematology and Oncology Follow Up Visit  Brittney Wright [id number] January 01, 1935 77 y.o. 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  PAST THERAPY:  Radiation therapy completed 04/26/12, 20 fractions  Interim History:   Lumpectomy as noted patient recovered well from surgery.  Medications: I have reviewed the patient's current medications.  Allergies:  Allergies  Allergen Reactions  . Minocycline     Past Medical History, Surgical history, Social history, and Family History were reviewed and updated.  Review of Systems: Constitutional:  Negative for fever, chills, night sweats, anorexia, weight loss, pain. Cardiovascular: negative Respiratory: negative Neurological: negative Dermatological: negative ENT: negative Skin Gastrointestinal: negative Genito-Urinary: negative Hematological and Lymphatic: negative Breast: negative Musculoskeletal: negative Remaining ROS negative.  Physical Exam:  Blood pressure 147/73, pulse 99, temperature 97.9 F (36.6 C), resp. rate 20, height 5' 4.5" (1.638 m), weight 192 lb 9.6 oz (87.363 kg).  ECOG: 0      Lab Results: Lab Results  Component Value Date   WBC 4.8 06/18/2012   HGB 11.3* 06/18/2012   HCT 34.4* 06/18/2012   MCV 91.1 06/18/2012   PLT 231 06/18/2012     Chemistry      Component Value Date/Time   NA 139 06/18/2012 1039   NA 137 01/31/2012 1030   K 4.9 06/18/2012 1039   K 4.9 01/31/2012 1030   CL 107 06/18/2012 1039   CL 103 01/31/2012 1030   CO2 25 06/18/2012 1039   CO2 24 01/31/2012 1030   BUN 30.0* 06/18/2012 1039   BUN 36* 01/31/2012 1030   CREATININE 1.7* 06/18/2012 1039   CREATININE 1.54* 01/31/2012 1030      Component Value Date/Time   CALCIUM 8.9 06/18/2012 1039   CALCIUM 9.5 01/31/2012 1030   ALKPHOS 51 06/18/2012 1039   ALKPHOS 59 01/18/2012 0846   AST 17 06/18/2012 1039   AST 22 01/18/2012 0846   ALT 11 06/18/2012 1039   ALT 21 01/18/2012 0846   BILITOT 0.30 06/18/2012 1039   BILITOT 0.3 01/18/2012 0846       Radiological Studies:  Mm Breast Surgical Specimen  02/03/2012  *RADIOLOGY REPORT*  Clinical Data:  Personal history of left breast cancer for left breast surgery  NEEDLE LOCALIZATION WITH MAMMOGRAPHIC GUIDANCE AND SPECIMEN RADIOGRAPH  Comparison:  Previous exams.  Patient presents for needle localization prior to left breast surgery.  I met with the patient and we discussed the procedure of needle localization including benefits and alternatives. We discussed the high likelihood of a successful procedure. We discussed the risks of the procedure, including infection, bleeding, tissue injury, and further surgery. Informed, written consent was given.  Using mammographic guidance, sterile technique, 2% lidocaine and a 7 cm modified Kopans needle, the mass with clip in the left breast was localized using a cranial approach.  The films are marked for Dr. Ninfa Linden.  Specimen radiograph was performed at day surgery, and confirms mass, clip, wire present in the tissue sample.  The specimen is marked for pathology.  IMPRESSION: Needle localization left breast.  No apparent complications.  Original Report Authenticated By: Abelardo Diesel, M.D.   Mm Breast Wire Localization Left  02/03/2012  *RADIOLOGY REPORT*  Clinical Data:  Personal history of left breast cancer for left breast surgery  NEEDLE LOCALIZATION WITH MAMMOGRAPHIC GUIDANCE AND SPECIMEN RADIOGRAPH  Comparison:  Previous exams.  Patient presents for needle localization prior to left breast surgery.  I met with the patient and we discussed the procedure  of needle localization including benefits and alternatives. We discussed the high likelihood of a successful procedure. We discussed the risks of the procedure, including infection, bleeding, tissue injury, and further surgery. Informed, written consent was given.  Using mammographic guidance, sterile technique, 2% lidocaine and a 7 cm modified  Kopans needle, the mass with clip in the left breast was localized using a cranial approach.  The films are marked for Dr. Ninfa Linden.  Specimen radiograph was performed at day surgery, and confirms mass, clip, wire present in the tissue sample.  The specimen is marked for pathology.  IMPRESSION: Needle localization left breast.  No apparent complications.  Original Report Authenticated By: Abelardo Diesel, M.D.     IMPRESSIONS AND PLAN: A 77 y.o. female with   History of ER/PR positive DCIS. Status post lumpectomy. I have asked that the radiation oncology review her pathology to determine whether or not he feels that radiation would not be indicated in this circumstance. As far as adjuvant hormonal therapy is concerned I think that that would be appropriate. We discussed the benefits and side effects of tamoxifen. I've given her prescription for this. I've also referred her back to the radiation oncologist and given instructions that would indicate she should start her tamoxifen after radiation if that is done if not then she can start as soon as possible. I once again I will see her 3 months time. I given the data regarding the use of adjuvant AI therapy in this circumstance as well as an overall she would likely benefit from tamoxifen.  Spent more than half the time coordinating care, as well as discussion of BMI and its implications.      Brittney Wright 12/30/201312:24 PM Cell [id number]

## 2012-10-22 ENCOUNTER — Other Ambulatory Visit: Payer: Self-pay | Admitting: *Deleted

## 2012-10-22 MED ORDER — TAMOXIFEN CITRATE 20 MG PO TABS: 20.0000 mg | ORAL_TABLET | Freq: Every day | ORAL | Status: DC

## 2012-10-22 NOTE — Telephone Encounter (Signed)
Pt left message stating need to have tamoxifen refilled but needs to give new pharmacy information.  This RN returned called to pt and discussed above. Pt is now using [facility] and needs a 90 day supply with refills. Per phone call this RN informed pt she would be getting a call soon regarding being set up with a new MD in this practice.

## 2012-12-11 ENCOUNTER — Other Ambulatory Visit: Payer: Self-pay | Admitting: Oncology

## 2012-12-11 DIAGNOSIS — Z853 Personal history of malignant neoplasm of breast: Secondary | ICD-10-CM

## 2012-12-31 ENCOUNTER — Ambulatory Visit
Admission: RE | Admit: 2012-12-31 | Discharge: 2012-12-31 | Disposition: A | Discharge status: Home / Self Care | Payer: Medicare Other | Source: Ambulatory Visit | Attending: Oncology | Admitting: Oncology

## 2012-12-31 DIAGNOSIS — Z853 Personal history of malignant neoplasm of breast: Secondary | ICD-10-CM

## 2013-01-10 ENCOUNTER — Telehealth: Payer: Self-pay | Admitting: *Deleted

## 2013-01-10 NOTE — Telephone Encounter (Signed)
Confirmed appt date and time with pt to see Launa Grill, NP on 01/16/13 at 1:00pm.  Pt denies needs at this time.

## 2013-01-16 ENCOUNTER — Ambulatory Visit (HOSPITAL_BASED_OUTPATIENT_CLINIC_OR_DEPARTMENT_OTHER): Payer: Medicare Other | Admitting: Family

## 2013-01-16 ENCOUNTER — Encounter: Payer: Self-pay | Admitting: Family

## 2013-01-16 ENCOUNTER — Telehealth: Payer: Self-pay | Admitting: Oncology

## 2013-01-16 VITALS — BP 149/72 | HR 97 | Temp 98.5°F | Resp 20 | Ht 64.5 in | Wt 212.5 lb

## 2013-01-16 DIAGNOSIS — D0590 Unspecified type of carcinoma in situ of unspecified breast: Secondary | ICD-10-CM

## 2013-01-16 DIAGNOSIS — D0512 Intraductal carcinoma in situ of left breast: Secondary | ICD-10-CM

## 2013-01-16 NOTE — Progress Notes (Signed)
[place]  Telephone:(336) [phone] Fax:(336) [phone]  OFFICE PROGRESS NOTE   ID: Brittney Wright   DOB: 1934/11/06  MR#: [id number]  [id number]   PCP: Aura Dials, MD SU: Coralie Keens, M.D. RAD ONC: Arloa Koh, M.D.   HISTORY OF PRESENT ILLNESS: From Dr. Julien Girt new patient evaluation note dated 01/18/2012 "Brittney Wright is a 78 y.o. female. In [place] who presents with a new diagnosis of DCIS. She undergoes annual screening mammography. She is screening mammogram on 12/30/2011 which showed a subcentimeter nodule upper outer quadrant left breast. No calcifications were appreciated. On ultrasound this measured 7 x 5 x 5 mm. Biopsy performed on 01/10/2011 showed this to be DCIS. It was insufficient tissue for prognostic panel. An MRI scan was not performed. Patient was seen in the [facility] for discussion of her treatment options. She was seen by Dr. Eston Esters, Radiation Oncologist and Surgeon from [facility]."  Her subsequent history is as detailed below.  STAGE:  Cancer of upper-outer quadrant of female breast  Primary site: Breast (Left)  Staging method: AJCC 7th Edition  Clinical: Stage 0 (Tis (DCIS), N0, cM0)  Summary: Stage 0 (Tis (DCIS), N0, cM0)    INTERVAL HISTORY: Dr. Jana Hakim and I saw Brittney Wright today for followup of DCIS of left breast.  The patient was last seen by Dr. Truddie Coco on 06/18/2012.  Since her last office visit, the patient has been doing relatively well.  She is establishing herself with Dr. Virgie Dad service today.  REVIEW OF SYSTEMS: A 10 point review of systems was completed and is negative except ongoing hot flashes and night sweats right ear "pressure" accompanied by "head spinning" (possible vertigo/Mnire's disease) that happens to her primarily at night.  The patient has experienced intermittent right inguinal discomfort.  During her examination it was discovered she has urinary  stress incontinence.  Brittney Wright denies any other symptomatology.   PAST MEDICAL HISTORY: Past Medical History  Diagnosis Date  . Diabetes mellitus   . Hypertension   . SOB (shortness of breath) on exertion   . Back pain   . Depression   . Hot flashes   . Breast cancer 12/2011    Left breast DCIS  . Cataract     PAST SURGICAL HISTORY: Past Surgical History  Procedure Laterality Date  . Dilation and curettage of uterus    . Eye surgery  12,13    cataracts-both  . Colonoscopy    . Breast surgery  1976    rt br bx  . Cataract extraction Bilateral     08/2011 and 05/2011    FAMILY HISTORY Family History  Problem Relation Age of Onset  . Heart attack Mother   . Stroke Mother   . Heart attack Father   . Hypertension Sister   . Heart Problems Paternal Grandfather     GYNECOLOGIC HISTORY: G3, P2, 1 miscarriage, menarche age 78, age of parity 61,  menopause in her 59s, she used hormone replacement therapy for 5 years.  SOCIAL HISTORY: Brittney Wright has been divorced for over 70 years.  She has an adult daughter who lives in [place] and an adult son who lives in [place].  She has 4 grandchildren by marriage.  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  In her spare time she enjoys reading and watching sports.  ADVANCED DIRECTIVES: Not on file  HEALTH MAINTENANCE: History  Substance  Use Topics  . Smoking status: Former Smoker    Quit date: 06/21/1991  . Smokeless tobacco: Never Used  . Alcohol Use: Yes     Comment: Rarely    Colonoscopy: 2009 PAP: 2010 Bone density: 2000 Lipid panel: Not on file  Allergies  Allergen Reactions  . Minocycline     Current Outpatient Prescriptions  Medication Sig Dispense Refill  . ACCU-CHEK AVIVA PLUS test strip 1 each by Other route as needed.       [name] atorvastatin (LIPITOR) 80 MG tablet Take 80 mg by mouth daily.      . Betamethasone Valerate 0.12 % foam Apply 1  application topically as needed.       . fenofibrate 160 MG tablet Take 160 mg by mouth daily.      [name] glipiZIDE (GLUCOTROL) 10 MG tablet Take 10 mg by mouth daily.      . Lancets (ACCU-CHEK MULTICLIX) lancets 1 each by Other route as needed.       . latanoprost (XALATAN) 0.005 % ophthalmic solution Place 1 drop into both eyes at bedtime.       [name] lisinopril-hydrochlorothiazide (PRINZIDE,ZESTORETIC) 20-12.5 MG per tablet Take 1 tablet by mouth daily.      . pioglitazone (ACTOS) 30 MG tablet Take 30 mg by mouth daily.      . tamoxifen (NOLVADEX) 20 MG tablet Take 1 tablet (20 mg total) by mouth daily.  90 tablet  3   No current facility-administered medications for this visit.    OBJECTIVE: Filed Vitals:   01/16/13 1320  BP: 149/72  Pulse: 97  Temp: 98.5 F (36.9 C)  Resp: 20     Body mass index is 35.93 kg/(m^2).      ECOG FS: 1 - Symptomatic but completely ambulatory  General appearance: Alert, cooperative, well nourished, no apparent distress Head: Normocephalic, without obvious abnormality, atraumatic Eyes: Arcus senilis, PERRLA, EOMI, erythema around both eye orbitals Nose: Nares, septum and mucosa are normal, no drainage or sinus tenderness Neck: No adenopathy, supple, symmetrical, trachea midline, no tenderness Resp: Clear to auscultation bilaterally Cardio: Regular rate and rhythm, S1, S2 normal, no murmur, click, rub or gallop Breasts: Breasts are pendulous with glandular tissue bilaterally, left breast well-healed surgical scars, radiation changes noted on left breast, no nipple inversion, bilateral axillary fullness GI: Soft, distended, non-tender, hypoactive bowel sounds, excessive habitus Extremities: Extremities normal, atraumatic, no cyanosis or edema Lymph nodes: Cervical, supraclavicular, and axillary nodes normal Neurologic: Grossly normal   LAB RESULTS: Brittney Wright had laboratories drawn by her primary care physician at [facility] on  12/18/2012 which show:  Hemoglobin A1c 7.0, glucose 98, BUN 27, creatinine 1.68, EGFR (not African American) 30, sodium 140, potassium 5.2, chloride 110, CO2 25, anion gap 10.6, calcium 8.5, WBC 4.1, RBC 3.36, hemoglobin 10.3, hematocrit 31.3, MCH 30.7, MPV 9.6, MCV 93.0, MCHC 33.1, RDW 13.1, platelets 186, neutrophil percent 49.1, lymphocytes percent 32.8, monocytes percent, 11.5, eosinophils percent, 5.7, basophils percent, 0.9, neutrophils absolute 2.0, lymphocytes absolute 1.30, monocytes absolute 0.5, eosinophils absolute 0.2, basophils absolute 0.0, vitamin D 9.5.   Lab Results  Component Value Date   WBC 4.8 06/18/2012   NEUTROABS 2.9 06/18/2012   HGB 11.3* 06/18/2012   HCT 34.4* 06/18/2012   MCV 91.1 06/18/2012   PLT 231 06/18/2012      Chemistry      Component Value Date/Time   NA 139 06/18/2012 1039   NA 137 01/31/2012 1030   K 4.9 06/18/2012 1039  K 4.9 01/31/2012 1030   CL 107 06/18/2012 1039   CL 103 01/31/2012 1030   CO2 25 06/18/2012 1039   CO2 24 01/31/2012 1030   BUN 30.0* 06/18/2012 1039   BUN 36* 01/31/2012 1030   CREATININE 1.7* 06/18/2012 1039   CREATININE 1.54* 01/31/2012 1030      Component Value Date/Time   CALCIUM 8.9 06/18/2012 1039   CALCIUM 9.5 01/31/2012 1030   ALKPHOS 51 06/18/2012 1039   ALKPHOS 59 01/18/2012 0846   AST 17 06/18/2012 1039   AST 22 01/18/2012 0846   ALT 11 06/18/2012 1039   ALT 21 01/18/2012 0846   BILITOT 0.30 06/18/2012 1039   BILITOT 0.3 01/18/2012 0846     No results found for this basename: LABCA2    Urinalysis    Component Value Date/Time   COLORURINE YELLOW 08/21/2010 0221   APPEARANCEUR CLEAR 08/21/2010 0221   LABSPEC 1.014 08/21/2010 0221   PHURINE 5.0 08/21/2010 0221   GLUCOSEU 100* 08/21/2010 0221   HGBUR NEGATIVE 08/21/2010 0221   BILIRUBINUR NEGATIVE 08/21/2010 0221   KETONESUR NEGATIVE 08/21/2010 0221   PROTEINUR NEGATIVE 08/21/2010 0221   UROBILINOGEN 0.2 08/21/2010 0221   NITRITE NEGATIVE 08/21/2010 0221   LEUKOCYTESUR SMALL*  08/21/2010 0221    STUDIES: Mm Digital Diagnostic Bilat 12/31/2012   *RADIOLOGY REPORT*  Clinical Data:  78 year old female with history of left breast cancer.  DIGITAL DIAGNOSTIC BILATERAL MAMMOGRAM WITH CAD  Comparison: Multiple priors  Findings:  ACR Breast Density Category b:  There are scattered areas of fibroglandular density.  Standard CC and MLO views of both breasts in addition to spot magnification view of the left breast were obtained.  There are expected post lumpectomy changes in the left upper outer breast. No suspicious microcalcifications or masses.  Mammographic images were processed with CAD.  IMPRESSION: BI-RADS CATEGORY 2:  Benign finding(s).  RECOMMENDATION: Bilateral diagnostic mammogram in 1 year.  I have discussed the findings and recommendations with the patient. Results were also provided in writing at the conclusion of the visit.  If applicable, a reminder letter will be sent to the patient regarding her next appointment.   Original Report Authenticated By: Donavan Burnet, M.D.     ASSESSMENT: Brittney Wright is a 78 y.o. [place], [place] woman: 1.  Status post left breast needle core biopsy of the upper outer quadrant on 01/10/2012 which showed ductal carcinoma in situ, estrogen receptor 100% positive, progesterone receptor 100% positive.  2.  Status post left breast lumpectomy on 02/03/2012 for a stage 0, pTis, pNX, pMX, ductal carcinoma in situ, grade 2, estrogen receptor 100% positive, progesterone receptor 100% positive.  3.  The patient underwent radiation therapy from 03/28/2012 through 04/26/2012.  4.  The patient started antiestrogen therapy with Tamoxifen in 06/2012.    5.  Urinary stress incontinence  6.  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  Low vitamin D level  PLAN: Brittney Wright will continue antiestrogen therapy with Tamoxifen for 5 years until 06/2017.  The possible side effects of tamoxifen including uterine cancer, cataract formation,  and blood clot formation were discussed in detail with Brittney Wright.  Dr. Jana Hakim also discussed antiestrogen therapy with Tamoxifen to the patient in detail.   The patient was asked to follow up urinary stress incontinence with Dr. Matilde Sprang at [facility] in [place], [place].  The patient was asked to contact an ENT or neurologist for consult regarding possible vertigo/Mnire's disease.  Brittney Wright was asked to increase vitamin D intake to  2000 IUs by mouth daily.  We will schedule her annual bilateral digital diagnostic mammogram in 12/2013 for her.  We plan to see Brittney Wright again in 12/2013 after her annual mammogram.  She states she will again have laboratories drawn by her primary care physician prior to her office visit with [place].  All questions were answered.  The patient was encouraged to contact us in the interim with any problems, questions or concerns.   Ailene Ards, NP-C 01/17/2013, 12:01 PM   ADDENDUM: This 78 year old [place] woman establish herself in my practice today. We reviewed her diagnosis, treatment history and prognosis. In brief:  She underwent left lumpectomy August of 2013 for an intermediate grade ductal carcinoma in situ which was estrogen and progesterone receptor both 100% positive. After completing adjuvant radiation therapy November of 2013, she started tamoxifen. She is tolerating this moderately well, with some hot flashes.  I don't believe the patient's vertigo and sinus problems are related to the tamoxifen. Brittney Wright was encouraged to discuss this with her primary care physician or consider an ENT specialist.  As far as her breast cancer is concerned she understands it is not life threatening, and bad the tamoxifen in addition to reducing her risk of this cancer recurring will reduce her risk of a new breast cancer developing in either breast. She is very motivated to continue.  I personally saw this patient and performed a  substantive portion of this encounter with the listed APP documented above.   Lurline Del, M.D.

## 2013-01-16 NOTE — Patient Instructions (Addendum)
Please contact us at [phone] if you have any questions or concerns.  Please continue to do well and enjoy life!!!  Get plenty of rest, drink plenty of water, exercise daily (walking), eat a balanced diet.  Take vitamin D 2000 IUs daily.  [facility] [place], [place], [place] [address] [phone] Dr. Reece Packer, MD, [facility]    Mm Digital Diagnostic Bilat 12/31/2012   *RADIOLOGY REPORT*  Clinical Data:  78 year old female with history of left breast cancer.  DIGITAL DIAGNOSTIC BILATERAL MAMMOGRAM WITH CAD  Comparison: Multiple priors  Findings:  ACR Breast Density Category b:  There are scattered areas of fibroglandular density.  Standard CC and MLO views of both breasts in addition to spot magnification view of the left breast were obtained.  There are expected post lumpectomy changes in the left upper outer breast. No suspicious microcalcifications or masses.  Mammographic images were processed with CAD.  IMPRESSION: BI-RADS CATEGORY 2:  Benign finding(s).  RECOMMENDATION: Bilateral diagnostic mammogram in 1 year.  I have discussed the findings and recommendations with the patient. Results were also provided in writing at the conclusion of the visit.  If applicable, a reminder letter will be sent to the patient regarding her next appointment.   Original Report Authenticated By: Donavan Burnet, M.D.

## 2013-10-30 ENCOUNTER — Telehealth: Payer: Self-pay | Admitting: Oncology

## 2013-10-30 ENCOUNTER — Other Ambulatory Visit: Payer: Self-pay | Admitting: Family

## 2013-10-30 DIAGNOSIS — Z853 Personal history of malignant neoplasm of breast: Secondary | ICD-10-CM

## 2013-10-30 NOTE — Telephone Encounter (Signed)
pt cld to chge appt stating that mamam not sch until 7/16-rsch to 7/20-pt understood

## 2013-11-12 ENCOUNTER — Other Ambulatory Visit: Payer: Self-pay | Admitting: *Deleted

## 2013-11-12 MED ORDER — TAMOXIFEN CITRATE 20 MG PO TABS: 20.0000 mg | ORAL_TABLET | Freq: Every day | ORAL | Status: DC

## 2013-12-30 ENCOUNTER — Other Ambulatory Visit: Payer: Self-pay | Admitting: Oncology

## 2013-12-30 ENCOUNTER — Ambulatory Visit: Payer: Medicare Other | Admitting: Oncology

## 2013-12-30 DIAGNOSIS — Z853 Personal history of malignant neoplasm of breast: Secondary | ICD-10-CM

## 2014-01-02 ENCOUNTER — Other Ambulatory Visit: Payer: Self-pay | Admitting: Oncology

## 2014-01-02 ENCOUNTER — Ambulatory Visit
Admission: RE | Admit: 2014-01-02 | Discharge: 2014-01-02 | Disposition: A | Discharge status: Home / Self Care | Payer: Medicare Other | Source: Ambulatory Visit | Attending: Family | Admitting: Family

## 2014-01-02 ENCOUNTER — Ambulatory Visit
Admission: RE | Admit: 2014-01-02 | Discharge: 2014-01-02 | Disposition: A | Discharge status: Home / Self Care | Payer: Medicare Other | Source: Ambulatory Visit | Attending: Oncology | Admitting: Oncology

## 2014-01-02 ENCOUNTER — Encounter (INDEPENDENT_AMBULATORY_CARE_PROVIDER_SITE_OTHER): Payer: Self-pay

## 2014-01-02 DIAGNOSIS — Z853 Personal history of malignant neoplasm of breast: Secondary | ICD-10-CM

## 2014-01-06 ENCOUNTER — Ambulatory Visit (HOSPITAL_BASED_OUTPATIENT_CLINIC_OR_DEPARTMENT_OTHER): Payer: Medicare Other | Admitting: Oncology

## 2014-01-06 VITALS — BP 145/65 | HR 106 | Temp 98.3°F | Resp 20 | Ht 64.5 in | Wt 216.8 lb

## 2014-01-06 DIAGNOSIS — D0590 Unspecified type of carcinoma in situ of unspecified breast: Secondary | ICD-10-CM

## 2014-01-06 DIAGNOSIS — Z170 Estrogen receptor positive status [ER+]: Secondary | ICD-10-CM

## 2014-01-06 DIAGNOSIS — C50419 Malignant neoplasm of upper-outer quadrant of unspecified female breast: Secondary | ICD-10-CM

## 2014-01-06 NOTE — Progress Notes (Signed)
Brittney Wright  Telephone:(336) [phone] Fax:(336) [phone]  OFFICE PROGRESS NOTE   ID: Brittney Wright   DOB: 1934/08/15  MR#: [id number]  [id number]   PCP: Brittney Dials, MD SU: Brittney Wright, M.D. RAD ONC: Brittney Wright, M.D.   HISTORY OF PRESENT ILLNESS: From Dr. Julien Wright new patient evaluation note dated 01/18/2012:  "Brittney Wright is a 79 y.o. female. In [place] who presents with a new diagnosis of DCIS. She undergoes annual screening mammography. She is screening mammogram on 12/30/2011 which showed a subcentimeter nodule upper outer quadrant left breast. No calcifications were appreciated. On ultrasound this measured 7 x 5 x 5 mm. Biopsy performed on 01/10/2011 showed this to be DCIS. It was insufficient tissue for prognostic panel. An MRI scan was not performed. Patient was [facility] in the [facility] for discussion of her treatment options. She was seen by Dr. Eston Wright, Radiation Oncologist and Surgeon from [facility]."    Her subsequent history is as detailed below.  STAGE:  Cancer of upper-outer quadrant of female breast  Primary site: Breast (Left)  Staging method: AJCC 7th Edition  Clinical: Stage 0 (Tis (DCIS), N0, cM0)  Summary: Stage 0 (Tis (DCIS), N0, cM0)    INTERVAL HISTORY: Brittney Wright returns today for followup of her noninvasive breast cancer. She tells me that about 3 months ago, which would've been April, she noted some bruising in the upper-outer quadrant of her left breast. She tells me she called here and disgusted with a nurse who told her it was probably due to radiation, although the patient had completed her radiation about a year before. At any rate she brought this to the attention of the breast Center when she was therefore routine mammography last week. Mammography and left breast ultrasonography were obtained. There were both entirely benign.  REVIEW OF SYSTEMS: The patient is tolerating  tamoxifen with hot flashes as her only side effect. She tells me she was having hot flashes even before starting tamoxifen, and that they are not frequent that they can be intense. She describes herself as mildly fatigued. She has poor circulation and her ankles swell at times. She has hemorrhoids. She has stress urinary incontinence. She has chronic low back pain. She feels depressed. She has been on several medications for diabetes and some of them made her gain weight. She cannot exercise by walking because of her back pain. She just got an exercise chair. She is very stressed because her ex-husband is declining in [place], status post a fall with fracture, and some evidence of dementia. The patient's daughter who lives in [place] is having to take care of this long distance. Aside from all this the patient denies any unusual symptoms or concerns  PAST MEDICAL HISTORY: Past Medical History  Diagnosis Date  . Diabetes mellitus   . Hypertension   . SOB (shortness of breath) on exertion   . Back pain   . Depression   . Hot flashes   . Breast cancer 12/2011    Left breast DCIS  . Cataract     PAST SURGICAL HISTORY: Past Surgical History  Procedure Laterality Date  . Dilation and curettage of uterus    . Eye surgery  12,13    cataracts-both  . Colonoscopy    . Breast surgery  1976    rt br bx  . Cataract extraction Bilateral     08/2011 and 05/2011    FAMILY HISTORY Family History  Problem Relation  Age of Onset  . Heart attack Mother   . Stroke Mother   . Heart attack Father   . Hypertension Sister   . Heart Problems Paternal Grandfather     GYNECOLOGIC HISTORY: G3, P2, 1 miscarriage, menarche age 65, age of parity 72,  menopause in her 13s, she used hormone replacement therapy for 5 years.  SOCIAL HISTORY: Ms. Zoto has been divorced for over 104 years.  She has an adult daughter who lives in [place] and an adult son who lives in [place].  She has 4  grandchildren by marriage.  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  In her spare time she enjoys reading and watching sports.  ADVANCED DIRECTIVES: Not on file  HEALTH MAINTENANCE: History  Substance Use Topics  . Smoking status: Former Smoker    Quit date: 06/21/1991  . Smokeless tobacco: Never Used  . Alcohol Use: Yes     Comment: Rarely    Colonoscopy: 2009 PAP: 2010 Bone density: 2000 Lipid panel: Not on file  Allergies  Allergen Reactions  . Minocycline     Current Outpatient Prescriptions  Medication Sig Dispense Refill  . ACCU-CHEK AVIVA PLUS test strip 1 each by Other route as needed.       [name] atorvastatin (LIPITOR) 80 MG tablet Take 80 mg by mouth daily.      . Betamethasone Valerate 0.12 % foam Apply 1 application topically as needed.       . fenofibrate 160 MG tablet Take 160 mg by mouth daily.      [name] glipiZIDE (GLUCOTROL) 10 MG tablet Take 10 mg by mouth daily.      . Lancets (ACCU-CHEK MULTICLIX) lancets 1 each by Other route as needed.       . latanoprost (XALATAN) 0.005 % ophthalmic solution Place 1 drop into both eyes at bedtime.       [name] lisinopril-hydrochlorothiazide (PRINZIDE,ZESTORETIC) 20-12.5 MG per tablet Take 1 tablet by mouth daily.      . pioglitazone (ACTOS) 30 MG tablet Take 30 mg by mouth daily.      . tamoxifen (NOLVADEX) 20 MG tablet Take 1 tablet (20 mg total) by mouth daily.  90 tablet  3   No current facility-administered medications for this visit.    OBJECTIVE: Older white woman in no acute distress Filed Vitals:   01/06/14 1552  BP: 145/65  Pulse: 106  Temp: 98.3 F (36.8 C)  Resp: 20     Body mass index is 36.65 kg/(m^2).      ECOG FS: 1 - Symptomatic but completely ambulatory  Sclerae unicteric, pupils equal and reactive Oropharynx clear and moist No cervical or supraclavicular adenopathy Lungs no rales or rhonchi Heart regular rate and rhythm Abd soft, obese,  nontender, positive bowel sounds MSK no focal spinal tenderness Neuro: nonfocal, well oriented, depressive affect Breasts: The right breast is unremarkable. The left breast is status post lumpectomy and radiation. There is an area in the upper-outer quadrant that is a little bit more prominent and show some evidence of scar tissue. There is nothing to suggest skin recurrence. The left axilla is benign.   LAB RESULTS: No lab work was obtained on this patient with noninvasive breast cancer  Lab Results  Component Value Date   WBC 4.8 06/18/2012   NEUTROABS 2.9 06/18/2012   HGB 11.3* 06/18/2012   HCT 34.4* 06/18/2012   MCV 91.1 06/18/2012   PLT 231  06/18/2012      Chemistry      Component Value Date/Time   NA 139 06/18/2012 1039   NA 137 01/31/2012 1030   K 4.9 06/18/2012 1039   K 4.9 01/31/2012 1030   CL 107 06/18/2012 1039   CL 103 01/31/2012 1030   CO2 25 06/18/2012 1039   CO2 24 01/31/2012 1030   BUN 30.0* 06/18/2012 1039   BUN 36* 01/31/2012 1030   CREATININE 1.7* 06/18/2012 1039   CREATININE 1.54* 01/31/2012 1030      Component Value Date/Time   CALCIUM 8.9 06/18/2012 1039   CALCIUM 9.5 01/31/2012 1030   ALKPHOS 51 06/18/2012 1039   ALKPHOS 59 01/18/2012 0846   AST 17 06/18/2012 1039   AST 22 01/18/2012 0846   ALT 11 06/18/2012 1039   ALT 21 01/18/2012 0846   BILITOT 0.30 06/18/2012 1039   BILITOT 0.3 01/18/2012 0846     No results found for this basename: LABCA2    Urinalysis    Component Value Date/Time   COLORURINE YELLOW 08/21/2010 0221   APPEARANCEUR CLEAR 08/21/2010 0221   LABSPEC 1.014 08/21/2010 0221   PHURINE 5.0 08/21/2010 0221   GLUCOSEU 100* 08/21/2010 0221   HGBUR NEGATIVE 08/21/2010 0221   BILIRUBINUR NEGATIVE 08/21/2010 0221   KETONESUR NEGATIVE 08/21/2010 0221   PROTEINUR NEGATIVE 08/21/2010 0221   UROBILINOGEN 0.2 08/21/2010 0221   NITRITE NEGATIVE 08/21/2010 0221   LEUKOCYTESUR SMALL* 08/21/2010 0221    STUDIES: US Breast Ltd Uni Left Inc Axilla  01/02/2014    CLINICAL DATA:  Patient with history of left breast lumpectomy 2013. Patient reports slight tenderness and firmness to palpation within the upper-outer left breast. She states this has been present for approximately 3 months. Additionally she states there is mild overlying skin discoloration.  EXAM: DIGITAL DIAGNOSTIC BILATERAL MAMMOGRAM WITH 3D TOMOSYNTHESIS WITH CAD  ULTRASOUND LEFT BREAST  COMPARISON:  [facility]  ACR Breast Density Category b: There are scattered areas of fibroglandular density.  FINDINGS: Postlumpectomy changes left breast. No concerning masses, calcifications or nonsurgical architectural distortion.  Mammographic images were processed with CAD.  On physical exam, dense tissue is palpated within the upper-outer left breast.  Ultrasound is performed, showing no concerning mass at the area of palpable concern within the upper-outer left breast. Dense fibroglandular tissue is visualized.  IMPRESSION: Post lumpectomy changes left breast. No mammographic evidence for malignancy.  RECOMMENDATION: Bilateral diagnostic mammography in 1 year.  I have discussed the findings and recommendations with the patient. Results were also provided in writing at the conclusion of the visit. If applicable, a reminder letter will be sent to the patient regarding the next appointment.  BI-RADS CATEGORY  2: Benign.   Electronically Signed   By: Lovey Newcomer M.D.   On: 01/02/2014 13:33    ASSESSMENT: Ms. Lat is a 79 y.o. [place], [place] woman:  1.  Status post left breast lumpectomy on 02/03/2012 for a  pTis, pNX, pMX, stage 0,ductal carcinoma in situ, grade 2, estrogen receptor 100% positive, progesterone receptor 100% positive.  2. received radiation therapy from 03/28/2012 through 04/26/2012.  3. started antiestrogen therapy with Tamoxifen in 06/2012.    PLAN: Arleigh is tolerating the tamoxifen without any unusual side effects. The plan is to continue to a total of 5 years. She thought she was  getting $700 without medication but that does not seem possible. I asked her to check as the total) should be less than $10.  I am not sure why she  has an area of irregularity in the upper outer left breast. This feels like scar tissue to me. She just had a mammogram and ultrasound with specific attention to that area and they were entirely benign. I reassured her that her tumor was noninvasive and that her overall prognosis is excellent.  She will see me again in August of next year, after her next mammogram. She knows to call for any problems that may develop before that visit.  01/06/2014, 4:19 PM Chauncey Cruel, MD

## 2014-01-07 ENCOUNTER — Telehealth: Payer: Self-pay | Admitting: Oncology

## 2014-01-07 NOTE — Telephone Encounter (Signed)
S/w pt gave appt 02/05/15. Also mailed appt calendar.

## 2014-01-30 ENCOUNTER — Telehealth: Payer: Self-pay | Admitting: *Deleted

## 2014-01-30 NOTE — Telephone Encounter (Signed)
Called pt who left vm. She completed radiation treatment to left breast on 04/26/12. Patient states about 4 months ago she developed a "bruised" area approximately 6" x 2" on her left breast . She states it is between the top half of her breast and axilla. She saw Dr Jana Hakim on 01/06/14, and she states he felt it was from trauma. Pt denies history of injury to her left breast. She takes ASA 81 mg, Lipitor daily. Pt states her PCP felt this was due to radiation treatment. Patient states she would like Dr Charlton Amor opinion. Advised her Dr Valere Dross may request she come for FU. Pt states she is concerned about her insurance covering a FU visit.  Informed her Dr Valere Dross is out of this office until 02/03/14, and this RN will inform him of her concerns and call her Monday with his response. Pt verbalized understanding and agreement with plan.

## 2014-01-30 NOTE — Telephone Encounter (Signed)
Let her know that I am more than happy to see her for a follow-up visit. RM

## 2014-01-31 ENCOUNTER — Encounter: Payer: Self-pay | Admitting: *Deleted

## 2014-01-31 ENCOUNTER — Telehealth: Payer: Self-pay | Admitting: *Deleted

## 2014-01-31 NOTE — Telephone Encounter (Signed)
Spoke with patient to inform her that per Dr Valere Dross, he will see her for follow up appointment. Patient verbalized concern of insurance coverage; offered to transfer her call to financial advisors in front office. Patient refused stating "I will deal with that later." Ms Samuel agreed to see Dr Valere Dross for FU . Spoke w/Karen Dillingham; she will call pt with appt date/time.

## 2014-02-04 ENCOUNTER — Encounter: Payer: Self-pay | Admitting: Radiation Oncology

## 2014-02-04 ENCOUNTER — Ambulatory Visit
Admission: RE | Admit: 2014-02-04 | Discharge: 2014-02-04 | Disposition: A | Discharge status: Home / Self Care | Payer: Medicare Other | Source: Ambulatory Visit | Attending: Radiation Oncology | Admitting: Radiation Oncology

## 2014-02-04 VITALS — BP 108/54 | HR 109 | Temp 97.6°F | Resp 20

## 2014-02-04 DIAGNOSIS — C50412 Malignant neoplasm of upper-outer quadrant of left female breast: Secondary | ICD-10-CM

## 2014-02-04 NOTE — Progress Notes (Signed)
CC: Dr. Gunnar Bulla Magrinat, Dr. Coralie Keens  Followup note:  The patient visits today after seeing Dr. Jana Hakim reporting "bruising in the upper-outer quadrant of her left breast" beginning approximately 2 months ago. She seen today for further evaluation. Of note is that her recent mammography and left breast ultrasonography were without evidence for recurrent disease. She is now one year and 10 months following completion of radiation therapy following conservative surgery in the management of her DCIS. She did receive hyperfractionated radiation therapy along with a boost to her tumor bed.  Physical examination: Alert and oriented. Filed Vitals:   02/04/14 0931  BP: 108/54  Pulse: 109  Temp: 97.6 F (36.4 C)  Resp: 20   Nodes: Without palpable cervical, supraclavicular, or axillary lymphadenopathy. Breasts: On inspection left breast there is an area of hypopigmentation along the upper-outer quadrant. This is superior and lateral to her left partial mastectomy scar and electron beam boost field. A palpation there is thickening of the skin with underlying induration. There are no telangiectasias. No breast masses are appreciated.  Impression: I reviewed her dosimetry, and she did not have greater than 105% of the prescribed dose to any portion of the breast. Initially, thought that her electron beam boost may have been in the vicinity of her skin changes, but the skin changes are superior and lateral to the area of her electron beam boost. I told the patient that I think that her skin changes are probably related to her radiation therapy, and perhaps because she had hypofractionated treatment. I agree with Dr. Jana Hakim that this does not appear to be recurrent disease (DCIS).  Plan: She should continue with her routine screening. If this lesion progresses, then we may need to have her see Dr. Ninfa Linden for a followup visit. She'll monitor this area closely and let us know. I told her I was more  than happy to see her in the future.

## 2014-02-04 NOTE — Progress Notes (Signed)
Patient denies pain. She is here for examination due to "bruised" area of left breast s/p radiation treatments in 2013.

## 2014-02-06 ENCOUNTER — Ambulatory Visit: Payer: Medicare Other | Attending: Family Medicine

## 2014-02-06 DIAGNOSIS — F329 Major depressive disorder, single episode, unspecified: Secondary | ICD-10-CM | POA: Insufficient documentation

## 2014-02-06 DIAGNOSIS — F3289 Other specified depressive episodes: Secondary | ICD-10-CM | POA: Diagnosis not present

## 2014-02-06 DIAGNOSIS — E119 Type 2 diabetes mellitus without complications: Secondary | ICD-10-CM | POA: Insufficient documentation

## 2014-02-06 DIAGNOSIS — M545 Low back pain, unspecified: Secondary | ICD-10-CM | POA: Insufficient documentation

## 2014-02-06 DIAGNOSIS — I10 Essential (primary) hypertension: Secondary | ICD-10-CM | POA: Insufficient documentation

## 2014-02-06 DIAGNOSIS — IMO0001 Reserved for inherently not codable concepts without codable children: Secondary | ICD-10-CM | POA: Insufficient documentation

## 2014-02-06 DIAGNOSIS — C50919 Malignant neoplasm of unspecified site of unspecified female breast: Secondary | ICD-10-CM | POA: Diagnosis not present

## 2014-02-11 ENCOUNTER — Ambulatory Visit: Payer: Medicare Other | Admitting: Physical Therapy

## 2014-02-11 DIAGNOSIS — IMO0001 Reserved for inherently not codable concepts without codable children: Secondary | ICD-10-CM | POA: Diagnosis not present

## 2014-02-13 ENCOUNTER — Ambulatory Visit: Payer: Medicare Other | Admitting: Physical Therapy

## 2014-02-13 DIAGNOSIS — IMO0001 Reserved for inherently not codable concepts without codable children: Secondary | ICD-10-CM | POA: Diagnosis not present

## 2014-02-18 ENCOUNTER — Ambulatory Visit: Payer: Medicare Other | Attending: Family Medicine | Admitting: Physical Therapy

## 2014-02-18 DIAGNOSIS — IMO0001 Reserved for inherently not codable concepts without codable children: Secondary | ICD-10-CM | POA: Diagnosis not present

## 2014-02-18 DIAGNOSIS — C50919 Malignant neoplasm of unspecified site of unspecified female breast: Secondary | ICD-10-CM | POA: Diagnosis not present

## 2014-02-18 DIAGNOSIS — M545 Low back pain, unspecified: Secondary | ICD-10-CM | POA: Diagnosis not present

## 2014-02-18 DIAGNOSIS — E119 Type 2 diabetes mellitus without complications: Secondary | ICD-10-CM | POA: Diagnosis not present

## 2014-02-18 DIAGNOSIS — I10 Essential (primary) hypertension: Secondary | ICD-10-CM | POA: Diagnosis not present

## 2014-03-03 ENCOUNTER — Encounter: Payer: Medicare Other | Admitting: Physical Therapy

## 2014-03-05 ENCOUNTER — Encounter: Payer: Medicare Other | Admitting: Physical Therapy

## 2014-03-10 ENCOUNTER — Encounter: Payer: Medicare Other | Admitting: Physical Therapy

## 2014-05-14 ENCOUNTER — Other Ambulatory Visit: Payer: Self-pay | Admitting: Gastroenterology

## 2014-05-14 DIAGNOSIS — R1011 Right upper quadrant pain: Secondary | ICD-10-CM

## 2014-05-22 ENCOUNTER — Ambulatory Visit
Admission: RE | Admit: 2014-05-22 | Discharge: 2014-05-22 | Disposition: A | Discharge status: Home / Self Care | Payer: Medicare Other | Source: Ambulatory Visit | Attending: Gastroenterology | Admitting: Gastroenterology

## 2014-05-22 DIAGNOSIS — R1011 Right upper quadrant pain: Secondary | ICD-10-CM

## 2014-05-23 ENCOUNTER — Other Ambulatory Visit (HOSPITAL_COMMUNITY): Payer: Self-pay | Admitting: Gastroenterology

## 2014-05-23 DIAGNOSIS — K838 Other specified diseases of biliary tract: Secondary | ICD-10-CM

## 2014-06-02 ENCOUNTER — Other Ambulatory Visit: Payer: Self-pay | Admitting: Gastroenterology

## 2014-06-02 DIAGNOSIS — K838 Other specified diseases of biliary tract: Secondary | ICD-10-CM

## 2014-06-03 ENCOUNTER — Ambulatory Visit
Admission: RE | Admit: 2014-06-03 | Discharge: 2014-06-03 | Disposition: A | Discharge status: Home / Self Care | Payer: Medicare Other | Source: Ambulatory Visit | Attending: Gastroenterology | Admitting: Gastroenterology

## 2014-06-03 DIAGNOSIS — K838 Other specified diseases of biliary tract: Secondary | ICD-10-CM

## 2014-06-04 ENCOUNTER — Other Ambulatory Visit (HOSPITAL_COMMUNITY): Payer: Self-pay | Admitting: Family Medicine

## 2014-06-04 DIAGNOSIS — R609 Edema, unspecified: Secondary | ICD-10-CM

## 2014-06-06 ENCOUNTER — Ambulatory Visit (HOSPITAL_COMMUNITY)
Admission: RE | Admit: 2014-06-06 | Discharge: 2014-06-06 | Disposition: A | Discharge status: Home / Self Care | Payer: Medicare Other | Source: Ambulatory Visit | Attending: Cardiology | Admitting: Cardiology

## 2014-06-06 DIAGNOSIS — R609 Edema, unspecified: Secondary | ICD-10-CM

## 2014-06-06 NOTE — Progress Notes (Signed)
2D Echocardiogram Complete.  06/06/2014   Brittney Wright [place], [place]

## 2014-08-27 ENCOUNTER — Ambulatory Visit
Admission: RE | Admit: 2014-08-27 | Discharge: 2014-08-27 | Disposition: A | Discharge status: Home / Self Care | Payer: PPO | Source: Ambulatory Visit | Attending: Radiation Oncology | Admitting: Radiation Oncology

## 2014-08-27 ENCOUNTER — Encounter: Payer: Self-pay | Admitting: Radiation Oncology

## 2014-08-27 VITALS — BP 133/81 | HR 110 | Temp 97.9°F | Ht 64.5 in | Wt 217.2 lb

## 2014-08-27 DIAGNOSIS — C50412 Malignant neoplasm of upper-outer quadrant of left female breast: Secondary | ICD-10-CM

## 2014-08-27 NOTE — Progress Notes (Signed)
Brittney Wright reports aching in her breast and reports that the skin on her breast is red and hard to touch.  Note enlarged, firm area near her axilla with a sensation of warm.  Grades pain in this area as a 3/10

## 2014-08-27 NOTE — Progress Notes (Signed)
Follow-up note:  Brittney Wright returns today approximately 2 years and 4 months following completion of radiation therapy following conservative surgery in the management of her DCIS of the left breast.  She is seen today worried about firmness along the upper-outer quadrant of the left breast, same area of concern when I saw her in August 2015.  She states that this area is at times uncomfortable which she describes as "dull aching".  She sees discoloration as well.  Her last mammography was in July 2015 just prior to her visit with me last August.  There was no area of concern.  Physical examination: Alert and oriented.  No acute distress. Filed Vitals:   08/27/14 1154  BP: 133/81  Pulse: 110  Temp: 97.9 F (36.6 C)   Nodes: There is no palpable cervical, supraclavicular, or axillary lymphadenopathy.  Breasts: There is a 6 x 8 cm area of fibrosis along the upper-outer quadrant of the left breast which is just superior and lateral to her left partial mastectomy scar, and also centered superior to her electron beam boost field.  There is faint central hypopigmentation with faint surrounding erythema.  There is no telangiectasia.  Extremities: Without edema.  The left shoulder has full range of motion.  Impression: I believe that we are dealing with slight progression of fibrosis secondary to her radiation therapy which may be secondary to hypofractionation.  This was not prevalent in the [place] hypofractionated series.  It is possible that she may have some inherited predisposition for failure to repair sublethal damage which could result of fibrosis.  I again reviewed her dosimetry, and there were no significant "hot spots" and there were no areas of dose over 5% of the prescribed dose which is felt to be a quality indicator.  I reassured her that this is not representative or predict for recurrent disease.  This may become progressive, but I expect fibrosis to stabilize over time.  Fibrosis may be  limited by [place], but since she is minimal is symptomatic I would simply follow her for the time being.  She may take NSAIDs when necessary discomfort.  I will see her back for a follow-up visit this September.  Plan: Mammography this July and follow-up visit with me in September.

## 2014-12-18 ENCOUNTER — Other Ambulatory Visit: Payer: Self-pay | Admitting: Oncology

## 2014-12-18 DIAGNOSIS — Z853 Personal history of malignant neoplasm of breast: Secondary | ICD-10-CM

## 2015-01-05 ENCOUNTER — Ambulatory Visit
Admission: RE | Admit: 2015-01-05 | Discharge: 2015-01-05 | Disposition: A | Discharge status: Home / Self Care | Payer: PPO | Source: Ambulatory Visit | Attending: Oncology | Admitting: Oncology

## 2015-01-05 DIAGNOSIS — Z853 Personal history of malignant neoplasm of breast: Secondary | ICD-10-CM

## 2015-01-09 ENCOUNTER — Other Ambulatory Visit: Payer: Self-pay | Admitting: *Deleted

## 2015-01-09 MED ORDER — TAMOXIFEN CITRATE 20 MG PO TABS: 20.0000 mg | ORAL_TABLET | Freq: Every day | ORAL | Status: DC

## 2015-02-05 ENCOUNTER — Ambulatory Visit (HOSPITAL_BASED_OUTPATIENT_CLINIC_OR_DEPARTMENT_OTHER): Payer: PPO | Admitting: Oncology

## 2015-02-05 VITALS — BP 132/57 | HR 104 | Temp 98.3°F | Resp 18 | Ht 64.5 in | Wt 218.9 lb

## 2015-02-05 DIAGNOSIS — Z170 Estrogen receptor positive status [ER+]: Secondary | ICD-10-CM

## 2015-02-05 DIAGNOSIS — C50412 Malignant neoplasm of upper-outer quadrant of left female breast: Secondary | ICD-10-CM

## 2015-02-05 DIAGNOSIS — D0512 Intraductal carcinoma in situ of left breast: Secondary | ICD-10-CM

## 2015-02-05 DIAGNOSIS — Z79810 Long term (current) use of selective estrogen receptor modulators (SERMs): Secondary | ICD-10-CM | POA: Diagnosis not present

## 2015-02-05 MED ORDER — TAMOXIFEN CITRATE 20 MG PO TABS: 20.0000 mg | ORAL_TABLET | Freq: Every day | ORAL | Status: DC

## 2015-02-05 NOTE — Progress Notes (Signed)
[facility]  Telephone:([phone]) [phone] Fax:(336) [phone]  OFFICE PROGRESS NOTE   ID: Brittney Wright   DOB: June 30, 1934  MR#: [id number]  [id number]   PCP: Aura Dials, MD SU: Coralie Keens, M.D. RAD ONC: Arloa Koh, M.D.   HISTORY OF PRESENT ILLNESS: From Dr. Julien Girt new patient evaluation note dated 01/18/2012:  "Brittney Wright is a 80 y.o. female. In [place] who presents with a new diagnosis of DCIS. She undergoes annual screening mammography. She is screening mammogram on 12/30/2011 which showed a subcentimeter nodule upper outer quadrant left breast. No calcifications were appreciated. On ultrasound this measured 7 x 5 x 5 mm. Biopsy performed on 01/10/2011 showed this to be DCIS. It was insufficient tissue for prognostic panel. An MRI scan was not performed. Patient was seen in the [facility] for discussion of her treatment options. She was seen by Dr. Eston Esters, Radiation Oncologist and Surgeon from [facility]."    Her subsequent history is as detailed below.  STAGE:  Cancer of upper-outer quadrant of female breast  Primary site: Breast (Left)  Staging method: AJCC 7th Edition  Clinical: Stage 0 (Tis (DCIS), N0, cM0)  Summary: Stage 0 (Tis (DCIS), N0, cM0)    INTERVAL HISTORY: Brittney Wright returns today for followup of her  Ductal carcinoma in situ. She continues on tamoxifen. She obtain Senokot under good price. She has some hot flashes but no other side effects from that.  REVIEW OF SYSTEMS:  she isn't "not doing much". Mostly she sits, watches TV, and reads. She does not belong to a gym. She does no exercise. She did gain some weight on the Actos. It also cause some swelling, she thinks. When she stopped that however her sugars went "way up" so she went back on it. She feels tired. She has back pain. She is depressed. In addition she has a runny nose. She is very deconditioned and is short of breath with any  significant activity. She has some hemorrhoidal bleeding at times. She has stress urinary incontinence. She bruises easily. A detailed review of systems today was otherwise stable  PAST MEDICAL HISTORY: Past Medical History  Diagnosis Date  . Diabetes mellitus   . Hypertension   . SOB (shortness of breath) on exertion   . Back pain   . Depression   . Hot flashes   . Breast cancer 12/2011    Left breast DCIS  . Cataract   . History of radiation therapy 03/28/12- 04/26/12    left breast 4250 cGy 17 sessions, left breast boost 750 cGy 3 sessions    PAST SURGICAL HISTORY: Past Surgical History  Procedure Laterality Date  . Dilation and curettage of uterus    . Eye surgery  12,13    cataracts-both  . Colonoscopy    . Breast surgery  1976    rt br bx  . Cataract extraction Bilateral     08/2011 and 05/2011    FAMILY HISTORY Family History  Problem Relation Age of Onset  . Heart attack Mother   . Stroke Mother   . Heart attack Father   . Hypertension Sister   . Heart Problems Paternal Grandfather     GYNECOLOGIC HISTORY: G3, P2, 1 miscarriage, menarche age 63, age of parity 34,  menopause in her 28s, she used hormone replacement therapy for 5 years.  SOCIAL HISTORY: Brittney Wright has been divorced for over 38 years.  She has an adult daughter who lives in [place] and an adult son who lives in [place].  She has 4 grandchildren by marriage.  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  In her spare time she enjoys reading and watching sports.  ADVANCED DIRECTIVES:  Her daughter is her healthcare power of attorney  HEALTH MAINTENANCE: Social History  Substance Use Topics  . Smoking status: Former Smoker    Quit date: 06/21/1991  . Smokeless tobacco: Never Used  . Alcohol Use: Yes     Comment: Rarely    Colonoscopy: 2009 PAP: 2010 Bone density: 2000 Lipid panel: Not on file  Allergies  Allergen Reactions   . Minocycline     Tingling face/tongue, flushing    Current Outpatient Prescriptions  Medication Sig Dispense Refill  . ACCU-CHEK AVIVA PLUS test strip 1 each by Other route as needed.     [name] atorvastatin (LIPITOR) 80 MG tablet Take 80 mg by mouth daily.    [name] BABY ASPIRIN PO Take 81 mg by mouth daily.    . Betamethasone Valerate 0.12 % foam Apply 1 application topically as needed.     . Cholecalciferol (VITAMIN D-400 PO) Take 1 tablet by mouth daily.    . DULoxetine (CYMBALTA) 20 MG capsule     . fenofibrate 160 MG tablet Take 160 mg by mouth daily.    [name] glipiZIDE (GLUCOTROL) 10 MG tablet Take 10 mg by mouth daily.    . Lancets (ACCU-CHEK MULTICLIX) lancets 1 each by Other route as needed.     . latanoprost (XALATAN) 0.005 % ophthalmic solution Place 1 drop into both eyes at bedtime.     [name] lisinopril-hydrochlorothiazide (PRINZIDE,ZESTORETIC) 20-12.5 MG per tablet Take 1 tablet by mouth daily.    . pioglitazone (ACTOS) 30 MG tablet Take 30 mg by mouth daily.    . tamoxifen (NOLVADEX) 20 MG tablet Take 1 tablet (20 mg total) by mouth daily. 90 tablet 3   No current facility-administered medications for this visit.    OBJECTIVE: Older white woman who appears deconditioned Filed Vitals:   02/05/15 1324  BP: 132/57  Pulse: 104  Temp: 98.3 F (36.8 C)  Resp: 18     Body mass index is 37.01 kg/(m^2).      ECOG FS: 1 - Symptomatic but completely ambulatory  Sclerae unicteric, EOMs intact Oropharynx clear, dentition in good repair No cervical or supraclavicular adenopathy Lungs no rales or rhonchi Heart regular rate and rhythm Abd soft,  Obese,nontender, positive bowel sounds MSK no focal spinal tenderness, no upper extremity lymphedema Neuro: nonfocal, well oriented, appropriate affect Breasts:  The right breast is unremarkable to the left breast is status post lumpectomy and radiation. There is no evidence of local recurrence to the left axilla is benign.   LAB RESULTS: No  lab work was obtained on this patient with noninvasive breast cancer  Lab Results  Component Value Date   WBC 4.8 06/18/2012   NEUTROABS 2.9 06/18/2012   HGB 11.3* 06/18/2012   HCT 34.4* 06/18/2012   MCV 91.1 06/18/2012   PLT 231 06/18/2012      Chemistry      Component Value Date/Time   NA 139 06/18/2012 1039   NA 137 01/31/2012 1030   K 4.9 06/18/2012 1039   K 4.9 01/31/2012 1030   CL 107 06/18/2012 1039   CL 103 01/31/2012 1030   CO2 25 06/18/2012 1039   CO2 24 01/31/2012 1030   BUN 30.0* 06/18/2012 1039  BUN 36* 01/31/2012 1030   CREATININE 1.7* 06/18/2012 1039   CREATININE 1.54* 01/31/2012 1030      Component Value Date/Time   CALCIUM 8.9 06/18/2012 1039   CALCIUM 9.5 01/31/2012 1030   ALKPHOS 51 06/18/2012 1039   ALKPHOS 59 01/18/2012 0846   AST 17 06/18/2012 1039   AST 22 01/18/2012 0846   ALT 11 06/18/2012 1039   ALT 21 01/18/2012 0846   BILITOT 0.30 06/18/2012 1039   BILITOT 0.3 01/18/2012 0846     No results found for: LABCA2  Urinalysis    Component Value Date/Time   COLORURINE YELLOW 08/21/2010 0221   APPEARANCEUR CLEAR 08/21/2010 0221   LABSPEC 1.014 08/21/2010 0221   PHURINE 5.0 08/21/2010 0221   GLUCOSEU 100* 08/21/2010 0221   HGBUR NEGATIVE 08/21/2010 0221   BILIRUBINUR NEGATIVE 08/21/2010 0221   KETONESUR NEGATIVE 08/21/2010 0221   PROTEINUR NEGATIVE 08/21/2010 0221   UROBILINOGEN 0.2 08/21/2010 0221   NITRITE NEGATIVE 08/21/2010 0221   LEUKOCYTESUR SMALL* 08/21/2010 0221    STUDIES: CLINICAL DATA: Annual examination. History of left breast cancer, diagnosed in 2013.  EXAM: DIGITAL DIAGNOSTIC BILATERAL MAMMOGRAM WITH 3D TOMOSYNTHESIS AND CAD  COMPARISON: Previous exam(s).  ACR Breast Density Category b: There are scattered areas of fibroglandular density.  FINDINGS: There are stable lumpectomy changes in the deep upper outer left breast. No mass, nonsurgical distortion, or suspicious microcalcification is identified to  suggest malignancy in either breast.  Mammographic images were processed with CAD.  IMPRESSION: No evidence of malignancy in either breast. Lumpectomy changes of the left breast.  RECOMMENDATION: Diagnostic mammogram is suggested in 1 year. (Code:DM-B-01Y)  I have discussed the findings and recommendations with the patient. Results were also provided in writing at the conclusion of the visit. If applicable, a reminder letter will be sent to the patient regarding the next appointment.  BI-RADS CATEGORY 2: Benign.   Electronically Signed  By: Curlene Dolphin M.D.  On: 01/05/2015 14:40    ASSESSMENT: Ms. Herzberg is a 80 y.o. [place], [place] woman:  1.  Status post left breast lumpectomy on 02/03/2012 for a  pTis, pNX, pMX, stage 0,ductal carcinoma in situ, grade 2, estrogen receptor 100% positive, progesterone receptor 100% positive.  2. received radiation therapy from 03/28/2012 through 04/26/2012.  3. started antiestrogen therapy with Tamoxifen in 06/2012.    PLAN: Brittney Wright is  Doing fine as far as her non invasive breast cancer is concerned. We reviewed the fact that in ductal carcinoma in situ the cancer cells are trapped in the ducts where they started so they cannot go 2 vital organ. Accordingly I don't check her liver function tests or kidney function tests etc.. She is following that through her primary care physician.   The plan is to continue tamoxifen through January of 2018.    she is not exercising, is gaining weight, is having retinopathy and kidney problems, and generally she understands she needs to get herself together. Today we talked about diet and I recommended she eat only one of the "Whites" per meal and only a small serving. She does like vegetables so that his A-plus.  She absolutely must start an exercise program. I think her insurance covers Silver sneakers. In any case she would be a good candidate for the [employer] program. She is  going to call otherwise and try to enroll.   I think a good goal for her would be to lose 10 pounds over the next year and 10 pounds over the second year.  She is not going to accomplish that unless she exercises on a regular basis , preferably 5 times a week for 45 minutes. [place] she is very intelligent and has very good insight she is however a bit depressed. She does not want to take antidepressants. Maybe just a little bit of willpower over the next 6 weeks will get her into the habit of becoming more active  We are going to see her again in one year and she will see our  survivorshipnurse practitioner at that time. She will see me again 2 years from now  02/05/2015, 1:30 PM Chauncey Cruel, MD

## 2015-02-24 ENCOUNTER — Ambulatory Visit
Admission: RE | Admit: 2015-02-24 | Discharge: 2015-02-24 | Disposition: A | Discharge status: Home / Self Care | Payer: PPO | Source: Ambulatory Visit | Attending: Radiation Oncology | Admitting: Radiation Oncology

## 2015-02-24 ENCOUNTER — Encounter: Payer: Self-pay | Admitting: Radiation Oncology

## 2015-02-24 VITALS — BP 152/59 | HR 96 | Temp 97.6°F | Resp 16 | Ht 64.5 in | Wt 216.8 lb

## 2015-02-24 DIAGNOSIS — C50412 Malignant neoplasm of upper-outer quadrant of left female breast: Secondary | ICD-10-CM

## 2015-02-24 NOTE — Progress Notes (Signed)
Follow-up note:  Brittney Wright returns today approximately 2 years and 10 months following completion of ration therapy following conservative surgery in the management of her DCIS of the left breast.  I last saw her this past March when she was concerned about induration/firmness along the upper-outer quadrant of left breast.  This remains unchanged.  She does have occasional aching of her left breast which has not changed.  She recently had mammography at the [facility] on 01/05/2015 and this was without evidence for recurrent disease.  She maintains follow-up with Dr. Jana Hakim in medical oncology and she is on adjuvant tamoxifen.  She's had some difficulty with fluid retention and is getting ready to see a nephrologist.  Physical examination: Alert and oriented. Filed Vitals:   02/24/15 1154  BP: 152/59  Pulse: 96  Temp: 97.6 F (36.4 C)  Resp: 16   Head and neck examination: There is slight facial/periorbital edema.  Nodes: Without palpable cervical, supraclavicular, or axillary lymphadenopathy.  Breasts: There remains thickening along the upper outer quadrant of the left breast with no discrete mass.  No telangiectasias.  Right breast without masses or lesions.  Extremities: There is no upper extremity lymphedema.  Impression: satisfactory progress.  No evidence for recurrent disease.    Plan: She will maintain her follow-up with Dr. Jana Hakim and also continue with her adjuvant tamoxifen.

## 2015-02-24 NOTE — Progress Notes (Signed)
Brittney Wright here for follow up.  She reports pain with movement of her left arm.  She is taking tamoxifen.  She reports feeling fatigued all the time.  She reports that she is retaining fluid in her lower legs and under her eyes.  She has an appointment with a kidneyspecialist next week. The skin on her left breast is intact.  She reports having a mammogram in July.  BP 152/59 mmHg  Pulse 96  Temp(Src) 97.6 F (36.4 C) (Oral)  Resp 16  Ht 5' 4.5" (1.638 m)  Wt 216 lb 12.8 oz (98.34 kg)  BMI 36.65 kg/m2

## 2015-04-02 ENCOUNTER — Other Ambulatory Visit: Payer: Self-pay

## 2015-07-08 ENCOUNTER — Ambulatory Visit: Payer: PPO | Admitting: *Deleted

## 2015-07-09 DIAGNOSIS — E1122 Type 2 diabetes mellitus with diabetic chronic kidney disease: Secondary | ICD-10-CM | POA: Diagnosis not present

## 2015-07-09 DIAGNOSIS — Z79899 Other long term (current) drug therapy: Secondary | ICD-10-CM | POA: Diagnosis not present

## 2015-08-25 ENCOUNTER — Encounter: Payer: PPO | Attending: Family Medicine | Admitting: Dietician

## 2015-08-25 ENCOUNTER — Encounter: Payer: Self-pay | Admitting: Dietician

## 2015-08-25 VITALS — Ht 64.5 in | Wt 217.0 lb

## 2015-08-25 DIAGNOSIS — N184 Chronic kidney disease, stage 4 (severe): Secondary | ICD-10-CM

## 2015-08-25 DIAGNOSIS — E1122 Type 2 diabetes mellitus with diabetic chronic kidney disease: Secondary | ICD-10-CM | POA: Diagnosis not present

## 2015-08-25 DIAGNOSIS — N189 Chronic kidney disease, unspecified: Secondary | ICD-10-CM | POA: Diagnosis not present

## 2015-08-25 DIAGNOSIS — IMO0002 Reserved for concepts with insufficient information to code with codable children: Secondary | ICD-10-CM

## 2015-08-25 DIAGNOSIS — E1165 Type 2 diabetes mellitus with hyperglycemia: Secondary | ICD-10-CM

## 2015-08-25 DIAGNOSIS — E118 Type 2 diabetes mellitus with unspecified complications: Secondary | ICD-10-CM

## 2015-08-25 NOTE — Patient Instructions (Signed)
Decrease your use of added salt.  Goal to not use added salt. Use unsalted nuts. Read the labels for processed foods.  Daily sodium limit is around 2000 mg. Avoid pickles.  Consider chow chow. Bake, broil, or grill rather than fried.  Aim for 2-3 Carb Choices per meal (30-45 grams) +/- 1 either way  Aim for 0-1 Carbs per snack if hungry  Include protein in moderation with your meals and snacks Consider reading food labels for Total Carbohydrate and Fat Grams of foods Consider  increasing your activity level by walking for 5-10 minutes daily as tolerated Continue checking BG at alternate times per day as directed by MD  Continue taking medication as directed by MD

## 2015-08-25 NOTE — Progress Notes (Signed)
Medical Nutrition Therapy:  Appt start time: [id number] end time:  [id number].   Assessment:  Primary concerns today: Patient is here alone.  She would like reinforcement and encouragement to be more compliant with her diabetes.  Hx includes HTN and hyperlipidemia, type 2 diabetes since 1998, and CKD stage 4.  She states that her main focus has been weight control rather than diabetes.  Her weight has slowly been increasing.  She lost 30 lbs in 2013 on Weight Wat[employer].  Some of weight currently is fluid.  Today's weight is 217 lbs. Her blood sugar's have been in the 60's-90's in the am recently.  Her Actose was increased after HgbA1C of 8.1% 05/20/15.     Patient lives alone in a [place].  She does her own shopping and cooking.  She has had several years of college classes in social science.  Goes to bed at 1 am or 2 am.  Preferred Learning Style:   No preference indicated   Learning Readiness:    Contemplating  MEDICATIONS: see list to include Actose    DIETARY INTAKE: Eats every 3 1/2-4 hours or "I get shaky".  I use too much salt.  I have a prescription for Lasix but have not filled this." Usual eating pattern includes 3 meals and 2 snacks per day.  Everyday foods include eggs, cheese.  Avoided foods include red meat, pork, little chicken.    24-hr recall:  B (6-10 AM): boiled egg, [employer] toast with butter, coffee with cream  Snk ( AM): apple or orange  L ( PM): Lean Cuisine, soup, cheese and crackers, or scrambled eggs Snk ( PM): nuts or fritos or fruit D ( PM): lean cuisine or spaghetti squash meal with [employer] pasta and feta cheese OR celery soup with potatoes and broccoli or fritatta or pasta salad with frozen vegetables Snk ( PM): popcorn, English muffin with butter or marmalade and milk or pecan cinnamon coffee cake with tea or rice krispies with 2% milk Beverages: hot tea, milk, coffee with cream, water, occasional regular coke (6 ounces)  Usual physical activity: none  Estimated energy  needs: 1400 calories 158 g carbohydrates 105 g protein 39 g fat  Progress Towards Goal(s):  In progress.   Nutritional Diagnosis:  NB-1.1 Food and nutrition-related knowledge deficit As related to balance of carbohydrates, protein, and fat, as well as sodium.  As evidenced by diet hx and patient report.    Intervention:  Nutrition counseling/education related to type 2 diabetes and CKD.  Focus to motivate patient for change.  Decrease your use of added salt.  Goal to not use added salt. Use unsalted nuts. Read the labels for processed foods.  Daily sodium limit is around 2000 mg. Avoid pickles.  Consider chow chow. Bake, broil, or grill rather than fried.  Aim for 2-3 Carb Choices per meal (30-45 grams) +/- 1 either way  Aim for 0-1 Carbs per snack if hungry  Include protein in moderation with your meals and snacks Consider reading food labels for Total Carbohydrate and Fat Grams of foods Consider  increasing your activity level by walking for 5-10 minutes daily as tolerated Continue checking BG at alternate times per day as directed by MD  Continue taking medication as directed by MD   Teaching Method Utilized:  Visual Auditory Hands on  Handouts given during visit include:  My plate  Snack list  Label reading  A1C sheet  Barriers to learning/adherence to lifestyle change: desire to change/depression  Demonstrated degree  of understanding via:  Teach Back   Monitoring/Evaluation:  Dietary intake, exercise, and body weight prn.

## 2015-09-08 DIAGNOSIS — E785 Hyperlipidemia, unspecified: Secondary | ICD-10-CM | POA: Diagnosis not present

## 2015-09-08 DIAGNOSIS — E119 Type 2 diabetes mellitus without complications: Secondary | ICD-10-CM | POA: Diagnosis not present

## 2015-09-08 DIAGNOSIS — E669 Obesity, unspecified: Secondary | ICD-10-CM | POA: Diagnosis not present

## 2015-09-08 DIAGNOSIS — E1142 Type 2 diabetes mellitus with diabetic polyneuropathy: Secondary | ICD-10-CM | POA: Diagnosis not present

## 2015-09-08 DIAGNOSIS — Z79899 Other long term (current) drug therapy: Secondary | ICD-10-CM | POA: Diagnosis not present

## 2015-09-08 DIAGNOSIS — I10 Essential (primary) hypertension: Secondary | ICD-10-CM | POA: Diagnosis not present

## 2015-09-08 DIAGNOSIS — F419 Anxiety disorder, unspecified: Secondary | ICD-10-CM | POA: Diagnosis not present

## 2015-09-08 DIAGNOSIS — N183 Chronic kidney disease, stage 3 (moderate): Secondary | ICD-10-CM | POA: Diagnosis not present

## 2015-09-21 ENCOUNTER — Other Ambulatory Visit (INDEPENDENT_AMBULATORY_CARE_PROVIDER_SITE_OTHER): Payer: Self-pay | Admitting: Otolaryngology

## 2015-09-21 DIAGNOSIS — R1310 Dysphagia, unspecified: Secondary | ICD-10-CM

## 2015-09-24 ENCOUNTER — Inpatient Hospital Stay (HOSPITAL_COMMUNITY): Admission: RE | Admit: 2015-09-24 | Payer: PPO | Source: Ambulatory Visit

## 2015-09-30 DIAGNOSIS — E1142 Type 2 diabetes mellitus with diabetic polyneuropathy: Secondary | ICD-10-CM | POA: Diagnosis not present

## 2015-09-30 DIAGNOSIS — N184 Chronic kidney disease, stage 4 (severe): Secondary | ICD-10-CM | POA: Diagnosis not present

## 2015-09-30 DIAGNOSIS — E113299 Type 2 diabetes mellitus with mild nonproliferative diabetic retinopathy without macular edema, unspecified eye: Secondary | ICD-10-CM | POA: Diagnosis not present

## 2015-09-30 DIAGNOSIS — E1122 Type 2 diabetes mellitus with diabetic chronic kidney disease: Secondary | ICD-10-CM | POA: Diagnosis not present

## 2015-09-30 DIAGNOSIS — M25473 Effusion, unspecified ankle: Secondary | ICD-10-CM | POA: Diagnosis not present

## 2015-09-30 DIAGNOSIS — Z7984 Long term (current) use of oral hypoglycemic drugs: Secondary | ICD-10-CM | POA: Diagnosis not present

## 2015-11-02 DIAGNOSIS — I7090 Unspecified atherosclerosis: Secondary | ICD-10-CM | POA: Diagnosis not present

## 2015-11-02 DIAGNOSIS — E113292 Type 2 diabetes mellitus with mild nonproliferative diabetic retinopathy without macular edema, left eye: Secondary | ICD-10-CM | POA: Diagnosis not present

## 2015-11-02 DIAGNOSIS — H35031 Hypertensive retinopathy, right eye: Secondary | ICD-10-CM | POA: Diagnosis not present

## 2015-11-02 DIAGNOSIS — E113291 Type 2 diabetes mellitus with mild nonproliferative diabetic retinopathy without macular edema, right eye: Secondary | ICD-10-CM | POA: Diagnosis not present

## 2015-11-02 DIAGNOSIS — H35372 Puckering of macula, left eye: Secondary | ICD-10-CM | POA: Diagnosis not present

## 2015-11-02 DIAGNOSIS — H35032 Hypertensive retinopathy, left eye: Secondary | ICD-10-CM | POA: Diagnosis not present

## 2015-11-04 ENCOUNTER — Ambulatory Visit
Admission: RE | Admit: 2015-11-04 | Discharge: 2015-11-04 | Disposition: A | Discharge status: Home / Self Care | Payer: PPO | Source: Ambulatory Visit | Attending: Family Medicine | Admitting: Family Medicine

## 2015-11-04 ENCOUNTER — Other Ambulatory Visit: Payer: Self-pay | Admitting: Family Medicine

## 2015-11-04 DIAGNOSIS — Z23 Encounter for immunization: Secondary | ICD-10-CM | POA: Diagnosis not present

## 2015-11-04 DIAGNOSIS — S96912A Strain of unspecified muscle and tendon at ankle and foot level, left foot, initial encounter: Secondary | ICD-10-CM | POA: Diagnosis not present

## 2015-11-04 DIAGNOSIS — M542 Cervicalgia: Secondary | ICD-10-CM | POA: Diagnosis not present

## 2015-11-04 DIAGNOSIS — T1490XA Injury, unspecified, initial encounter: Secondary | ICD-10-CM

## 2015-11-04 DIAGNOSIS — S99922A Unspecified injury of left foot, initial encounter: Secondary | ICD-10-CM | POA: Diagnosis not present

## 2015-11-04 DIAGNOSIS — S99912A Unspecified injury of left ankle, initial encounter: Secondary | ICD-10-CM | POA: Diagnosis not present

## 2015-11-04 DIAGNOSIS — S96919A Strain of unspecified muscle and tendon at ankle and foot level, unspecified foot, initial encounter: Secondary | ICD-10-CM | POA: Diagnosis not present

## 2015-11-09 DIAGNOSIS — L03116 Cellulitis of left lower limb: Secondary | ICD-10-CM | POA: Diagnosis not present

## 2015-11-13 DIAGNOSIS — L03116 Cellulitis of left lower limb: Secondary | ICD-10-CM | POA: Diagnosis not present

## 2015-11-19 DIAGNOSIS — L03116 Cellulitis of left lower limb: Secondary | ICD-10-CM | POA: Diagnosis not present

## 2015-12-08 DIAGNOSIS — K648 Other hemorrhoids: Secondary | ICD-10-CM | POA: Diagnosis not present

## 2015-12-23 ENCOUNTER — Other Ambulatory Visit: Payer: Self-pay | Admitting: Oncology

## 2015-12-23 DIAGNOSIS — Z853 Personal history of malignant neoplasm of breast: Secondary | ICD-10-CM

## 2016-01-06 ENCOUNTER — Other Ambulatory Visit: Payer: Self-pay | Admitting: Oncology

## 2016-01-06 ENCOUNTER — Ambulatory Visit
Admission: RE | Admit: 2016-01-06 | Discharge: 2016-01-06 | Disposition: A | Discharge status: Home / Self Care | Payer: PPO | Source: Ambulatory Visit | Attending: Oncology | Admitting: Oncology

## 2016-01-06 DIAGNOSIS — R928 Other abnormal and inconclusive findings on diagnostic imaging of breast: Secondary | ICD-10-CM | POA: Diagnosis not present

## 2016-01-06 DIAGNOSIS — N6489 Other specified disorders of breast: Secondary | ICD-10-CM | POA: Diagnosis not present

## 2016-01-06 DIAGNOSIS — Z853 Personal history of malignant neoplasm of breast: Secondary | ICD-10-CM

## 2016-02-05 ENCOUNTER — Telehealth: Payer: Self-pay | Admitting: Oncology

## 2016-02-05 NOTE — Telephone Encounter (Signed)
Pt left message saying she need an appt with md. I returned pt's call and discovered that someone had already called her and that she received an appt for survivorship clinic via mychart. Pt says the appt was made without anyone speaking with her so she is confused and needs to change the appt.   I moved pt's appt to 1pm on 9/11 per her request and advised her she will see md next year (per md note).

## 2016-02-25 ENCOUNTER — Telehealth: Payer: Self-pay | Admitting: Adult Health

## 2016-02-25 NOTE — Telephone Encounter (Signed)
02/29/2016 Appointment canceled per patient request. Patient called to cancel appointment, per patient, hurt her back and will call to reschedule later.

## 2016-02-29 ENCOUNTER — Encounter: Payer: PPO | Admitting: Adult Health

## 2016-03-22 DIAGNOSIS — N189 Chronic kidney disease, unspecified: Secondary | ICD-10-CM | POA: Diagnosis not present

## 2016-03-22 DIAGNOSIS — Z23 Encounter for immunization: Secondary | ICD-10-CM | POA: Diagnosis not present

## 2016-03-22 DIAGNOSIS — N183 Chronic kidney disease, stage 3 (moderate): Secondary | ICD-10-CM | POA: Diagnosis not present

## 2016-03-22 DIAGNOSIS — D631 Anemia in chronic kidney disease: Secondary | ICD-10-CM | POA: Diagnosis not present

## 2016-03-22 DIAGNOSIS — E1129 Type 2 diabetes mellitus with other diabetic kidney complication: Secondary | ICD-10-CM | POA: Diagnosis not present

## 2016-03-22 DIAGNOSIS — N2581 Secondary hyperparathyroidism of renal origin: Secondary | ICD-10-CM | POA: Diagnosis not present

## 2016-03-22 DIAGNOSIS — Z6837 Body mass index (BMI) 37.0-37.9, adult: Secondary | ICD-10-CM | POA: Diagnosis not present

## 2016-03-30 ENCOUNTER — Telehealth: Payer: Self-pay | Admitting: Adult Health

## 2016-03-30 NOTE — Telephone Encounter (Signed)
09/11 Appointment rescheduled to 10/16 per patient request.

## 2016-04-04 ENCOUNTER — Encounter: Payer: PPO | Admitting: Adult Health

## 2016-04-18 ENCOUNTER — Telehealth: Payer: Self-pay | Admitting: Adult Health

## 2016-04-18 ENCOUNTER — Ambulatory Visit (HOSPITAL_BASED_OUTPATIENT_CLINIC_OR_DEPARTMENT_OTHER): Payer: PPO | Admitting: Adult Health

## 2016-04-18 VITALS — BP 155/55 | HR 91 | Temp 97.7°F | Resp 18 | Wt 223.8 lb

## 2016-04-18 DIAGNOSIS — Z170 Estrogen receptor positive status [ER+]: Secondary | ICD-10-CM

## 2016-04-18 DIAGNOSIS — F339 Major depressive disorder, recurrent, unspecified: Secondary | ICD-10-CM | POA: Diagnosis not present

## 2016-04-18 DIAGNOSIS — D0512 Intraductal carcinoma in situ of left breast: Secondary | ICD-10-CM

## 2016-04-18 DIAGNOSIS — Z79810 Long term (current) use of selective estrogen receptor modulators (SERMs): Secondary | ICD-10-CM

## 2016-04-18 DIAGNOSIS — C50412 Malignant neoplasm of upper-outer quadrant of left female breast: Secondary | ICD-10-CM

## 2016-04-18 NOTE — Telephone Encounter (Signed)
At checkout pt stated she will call to schedule year appt with GM

## 2016-04-26 ENCOUNTER — Encounter: Payer: Self-pay | Admitting: Adult Health

## 2016-04-26 DIAGNOSIS — E785 Hyperlipidemia, unspecified: Secondary | ICD-10-CM | POA: Diagnosis not present

## 2016-04-26 DIAGNOSIS — Z1211 Encounter for screening for malignant neoplasm of colon: Secondary | ICD-10-CM | POA: Diagnosis not present

## 2016-04-26 DIAGNOSIS — N183 Chronic kidney disease, stage 3 (moderate): Secondary | ICD-10-CM | POA: Diagnosis not present

## 2016-04-26 DIAGNOSIS — M25512 Pain in left shoulder: Secondary | ICD-10-CM | POA: Diagnosis not present

## 2016-04-26 DIAGNOSIS — D631 Anemia in chronic kidney disease: Secondary | ICD-10-CM | POA: Diagnosis not present

## 2016-04-26 DIAGNOSIS — I10 Essential (primary) hypertension: Secondary | ICD-10-CM | POA: Diagnosis not present

## 2016-04-26 NOTE — Progress Notes (Signed)
CLINIC:  Survivorship   REASON FOR VISIT:  Routine follow-up for history of breast cancer.   BRIEF ONCOLOGIC HISTORY:  (From Dr. Virgie Dad last visit on 02/05/15)    INTERVAL HISTORY:  Brittney Wright presents to the [facility] today for routine follow-up for her history of breast cancer. She remains on the tamoxifen.  She reports some fatigue. She continues to feel depressed. She is not exercising regularly, but is trying to walk more. She does not sleep well, because she has to get up often to urinate. She endorses some night sweats and hot flashes, which have been chronic for the past 30 years. She reports a runny nose, feet swelling and poor circulation, dyspnea on exertion, urinary incontinence requiring her to wear a pad, and back pain.  She sees her PCP periodically.   REVIEW OF SYSTEMS:  Review of Systems  Constitutional: Positive for fatigue.  HENT:         Runny nose  Eyes: Negative.   Respiratory:       Dyspnea on exertion  Cardiovascular:       Feet swelling and poor circulation  Gastrointestinal:       Intermittent rectal bleeding; this is chronic and largely unchanged  Endocrine: Positive for hot flashes.  Genitourinary: Positive for nocturia. Negative for vaginal bleeding.   Musculoskeletal: Positive for back pain.  Skin: Negative.   Neurological: Negative.   Hematological: Bruises/bleeds easily.  Psychiatric/Behavioral: Positive for depression and sleep disturbance.  Breast: Denies any new nodularity, masses, tenderness, nipple changes, or nipple discharge.    A 14-point review of systems was completed and was negative, except as noted above.    PAST MEDICAL/SURGICAL HISTORY:  Past Medical History:  Diagnosis Date  . Back pain   . Breast cancer ([place]) 12/2011   Left breast DCIS  . Cataract   . Depression   . Diabetes mellitus   . History of radiation therapy 03/28/12- 04/26/12   left breast 4250 cGy 17 sessions, left breast boost 750 cGy 3  sessions  . Hot flashes   . Hypertension   . SOB (shortness of breath) on exertion    Past Surgical History:  Procedure Laterality Date  . BREAST SURGERY  1976   rt br bx  . CATARACT EXTRACTION Bilateral    08/2011 and 05/2011  . COLONOSCOPY    . DILATION AND CURETTAGE OF UTERUS    . EYE SURGERY  12,13   cataracts-both     ALLERGIES:  Allergies  Allergen Reactions  . Minocycline     Tingling face/tongue, flushing     CURRENT MEDICATIONS:  Outpatient Encounter Prescriptions as of 04/18/2016  Medication Sig Note  . ACCU-CHEK AVIVA PLUS test strip 1 each by Other route as needed.    [name] atorvastatin (LIPITOR) 80 MG tablet Take 80 mg by mouth daily.   [name] BABY ASPIRIN PO Take 81 mg by mouth daily.   . Cholecalciferol (VITAMIN D-400 PO) Take 1 tablet by mouth daily.   . fenofibrate 160 MG tablet Take 160 mg by mouth daily.   [name] glipiZIDE (GLUCOTROL) 10 MG tablet Take 10 mg by mouth daily.   . Lancets (ACCU-CHEK MULTICLIX) lancets 1 each by Other route as needed.    . latanoprost (XALATAN) 0.005 % ophthalmic solution Place 1 drop into both eyes at bedtime.    [name] lisinopril-hydrochlorothiazide (PRINZIDE,ZESTORETIC) 20-12.5 MG per tablet Take 1 tablet by mouth daily.   . pioglitazone (ACTOS) 30 MG tablet Take 30 mg by mouth  daily.   . tamoxifen (NOLVADEX) 20 MG tablet Take 1 tablet (20 mg total) by mouth daily.   . Betamethasone Valerate 0.12 % foam Apply 1 application topically as needed.    . DULoxetine (CYMBALTA) 20 MG capsule Reported on 08/25/2015 02/04/2014: 02/04/14 has not started yet   No facility-administered encounter medications on file as of 04/18/2016.      ONCOLOGIC FAMILY HISTORY:  Family History  Problem Relation Age of Onset  . Heart attack Mother   . Stroke Mother   . Heart attack Father   . Hypertension Sister   . Heart Problems Paternal Grandfather     GENETIC COUNSELING/TESTING: No results available for review.  SOCIAL HISTORY:  Brittney Wright is  divorced and lives alone in [place], [place]. She has 2 adult children, one son in one daughter. She has 4 grandchildren. She is currently retired; she previously worked for [employer] for [employer]. For fun, she enjoys reading, watching sports, and watching [employer], and is a self-proclaimed "policital junkie."  She denies any current tobacco or illicit drug use. She drinks alcohol occasionally.   PHYSICAL EXAMINATION:  Vital Signs: Vitals:   04/18/16 1408  BP: (!) 155/55  Pulse: 91  Resp: 18  Temp: 97.7 F (36.5 C)   Filed Weights   04/18/16 1408  Weight: 223 lb 12.8 oz (101.5 kg)   General: Well-nourished, well-appearing female in no acute distress.  She is unaccompanied today.   HEENT: Head is normocephalic.  Pupils equal and reactive to light. Conjunctivae clear without exudate.  Sclerae anicteric. Oral mucosa is pink, moist.  Oropharynx is pink without lesions or erythema.  Lymph: No cervical, supraclavicular, or infraclavicular lymphadenopathy noted on palpation.  Cardiovascular: Regular rate and rhythm.[name] Respiratory: Clear to auscultation bilaterally. Chest expansion symmetric; breathing non-labored.  Breast Exam:  -Left breast: No appreciable masses on palpation. No skin redness, thickening, or peau d'orange appearance; no nipple retraction or nipple discharge; healed scar without erythema or nodularity.  -Right breast: No appreciable masses on palpation. No skin redness, thickening, or peau d'orange appearance; no nipple retraction or nipple discharge. -Axilla: No axillary adenopathy bilaterally.  GI: Abdomen soft and round; non-tender, non-distended. Bowel sounds normoactive. No hepatosplenomegaly.   GU: Deferred.  Neuro: No focal deficits. Steady gait.  Psych: Mood and affect normal and appropriate for situation.  Extremities: No edema. Skin: Warm and dry.  LABORATORY DATA:  None for this visit.   DIAGNOSTIC IMAGING:  Most recent mammogram: 01/06/16       ASSESSMENT AND PLAN:  Ms.. Wright is a pleasant 81 y.o. 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  She [facility] for surveillance and routine follow-up.   1. History of Stage 0 left breast cancer:  Brittney Wright is currently clinically and radiographically without evidence of disease or recurrence of breast cancer. She will continue on the tamoxifen, with plans to complete 5 years of therapy in 06/2017. She will be due for annual mammogram in 12/2016; orders placed today. She will follow-up with her medical oncologist, Dr. Jana Hakim, in 03/2017 for continued surveillance. I encouraged her to call me should she have any questions or concerns before her next visit at the cancer center, and I could see her sooner if needed.  2. Depression: Clinically, Brittney Wright is depressed.  She is isolating herself often, and does not participate in many  social activities.  She shared that she has been struggling with depression for most of her life. I provided support and normalization of her concerns. We discussed that often times it is helpful to talk with a mental health professional to help manage depressed mood. I shared with her the pathophysiology of depression, and including the neuro biology of decreased serotonin present in depression. Often times, antidepressant medication completely overall in improving her mood. While she has stated clearly in the past that she is not interested in pursuing antidepressant medication, she now recognizes the role it may play in improving her overall health and well-being.  She shared that she has really been beating herself up lately, because she knows she needs to exercise and has not started doing so consistently. I let her know that my role is to support her and help her in her overall health and  wellness, and not shame her for not exercising regularly.  She understands that not exercising may negatively impact her health and "nobody can beat me up more about it than I already am doing to myself."  She tells me she has a lot of "negative self-talk" that she is really working hard to change. I acknowledged her concerns and encouraged her to use compassion and patience with herself.  I provided her support today with active listening and expressive supportive counseling. She tells me she better understands the way antidepressants may help her, and she is going to strongly consider medication in the future. I encouraged her to talk with her PCP about this, and they can certainly either start her on an antidepressant or refer her to a mental health specialist for further management.  3. Bone health:  Given Brittney Wright's age & history of breast cancer, she is at risk for bone demineralization. We reviewed that tamoxifen has the positive side effect of increased bone density. We do not have records of any DEXA imaging. I will defer any bone mineral density assessment to her medical oncologist or PCP, as clinically indicated.  In the meantime, she was encouraged to increase her consumption of foods rich in calcium, as well as increase her weight-bearing activities.  She was given education on specific food and activities to promote bone health.   Dispo:  -Annual mammogram due 12/2016; orders placed today. -Return to cancer center to see Dr. Jana Hakim in 03/2017.   A total of 30 minutes of face-to-face time was spent with this patient with greater than 50% of that time in counseling and care-coordination.   Mike Craze, NP [facility] [place] [phone]   Note: PRIMARY CARE PROVIDER Gennette Pac, [address] [phone]

## 2016-04-29 DIAGNOSIS — Z23 Encounter for immunization: Secondary | ICD-10-CM | POA: Diagnosis not present

## 2016-04-29 DIAGNOSIS — E1129 Type 2 diabetes mellitus with other diabetic kidney complication: Secondary | ICD-10-CM | POA: Diagnosis not present

## 2016-04-29 DIAGNOSIS — N183 Chronic kidney disease, stage 3 (moderate): Secondary | ICD-10-CM | POA: Diagnosis not present

## 2016-04-29 DIAGNOSIS — N2581 Secondary hyperparathyroidism of renal origin: Secondary | ICD-10-CM | POA: Diagnosis not present

## 2016-04-29 DIAGNOSIS — N189 Chronic kidney disease, unspecified: Secondary | ICD-10-CM | POA: Diagnosis not present

## 2016-04-29 DIAGNOSIS — D631 Anemia in chronic kidney disease: Secondary | ICD-10-CM | POA: Diagnosis not present

## 2016-04-29 DIAGNOSIS — Z6837 Body mass index (BMI) 37.0-37.9, adult: Secondary | ICD-10-CM | POA: Diagnosis not present

## 2016-05-09 ENCOUNTER — Ambulatory Visit: Payer: PPO | Attending: Family Medicine

## 2016-05-09 DIAGNOSIS — M25612 Stiffness of left shoulder, not elsewhere classified: Secondary | ICD-10-CM | POA: Diagnosis not present

## 2016-05-09 DIAGNOSIS — M545 Low back pain, unspecified: Secondary | ICD-10-CM

## 2016-05-09 DIAGNOSIS — H1013 Acute atopic conjunctivitis, bilateral: Secondary | ICD-10-CM | POA: Diagnosis not present

## 2016-05-09 DIAGNOSIS — H402231 Chronic angle-closure glaucoma, bilateral, mild stage: Secondary | ICD-10-CM | POA: Diagnosis not present

## 2016-05-09 DIAGNOSIS — M25652 Stiffness of left hip, not elsewhere classified: Secondary | ICD-10-CM | POA: Diagnosis not present

## 2016-05-09 DIAGNOSIS — M25512 Pain in left shoulder: Secondary | ICD-10-CM | POA: Diagnosis not present

## 2016-05-09 DIAGNOSIS — M25651 Stiffness of right hip, not elsewhere classified: Secondary | ICD-10-CM

## 2016-05-09 DIAGNOSIS — H02109 Unspecified ectropion of unspecified eye, unspecified eyelid: Secondary | ICD-10-CM | POA: Diagnosis not present

## 2016-05-09 NOTE — Patient Instructions (Signed)
From cabinet issued assisted flexion( 1-2 reps)  , scaupla retraction ( 1-3 reps) , post pelvic tilt (10 reps)  and single knee to chest (2-3 reps RT and LT) 2x/day 10-30 sec stretching

## 2016-05-09 NOTE — Therapy (Signed)
[place], [place], [address] Phone: [phone]   Fax:  [phone]  Physical Therapy Evaluation  Patient Details  Name: Brittney Wright MRN: [id number] Date of Birth: 07/06/1934 Referring Provider: Aura Dials, MD  Encounter Date: 05/09/2016      PT End of Session - 05/09/16 1638    Visit Number 1   Number of Visits 12   Date for PT Re-Evaluation 06/20/16   Authorization Type Medicare   PT Start Time 0345   PT Stop Time 0430   PT Time Calculation (min) 45 min   Activity Tolerance Patient tolerated treatment well;No increased pain   Behavior During Therapy WFL for tasks assessed/performed      Past Medical History:  Diagnosis Date  . Back pain   . Breast cancer ([place]) 12/2011   Left breast DCIS  . Cataract   . Depression   . Diabetes mellitus   . History of radiation therapy 03/28/12- 04/26/12   left breast 4250 cGy 17 sessions, left breast boost 750 cGy 3 sessions  . Hot flashes   . Hypertension   . SOB (shortness of breath) on exertion     Past Surgical History:  Procedure Laterality Date  . BREAST SURGERY  1976   rt br bx  . CATARACT EXTRACTION Bilateral    08/2011 and 05/2011  . COLONOSCOPY    . DILATION AND CURETTAGE OF UTERUS    . EYE SURGERY  12,13   cataracts-both    There were no vitals filed for this visit.       Subjective Assessment - 05/09/16 1549    Subjective She reports Lt shoulder pain to neck  with muscle and clavicle tendrness with limited ROM . She reports no injury. She report chronic back pain for years.   Last PT 3 years ago   Limitations Lifting;House hold activities  self care overhead limted,  can't vacumm , place items oin overhead shelves.    How long can you sit comfortably? 2 hours   How long can you stand comfortably? 30 min   How long can you walk comfortably? 1/4 mile or less   Diagnostic tests none   Patient Stated Goals No shoulder pain with  use, sleep on LT side. Back no pain.    Currently in Pain? Yes   Pain Score 2    Pain Location Shoulder   Pain Orientation Left   Pain Descriptors / Indicators Aching;Dull   Pain Type Chronic pain   Pain Onset More than a month ago   Pain Frequency Constant   Aggravating Factors  using LT arm   Pain Relieving Factors rest , heat   Multiple Pain Sites Yes   Pain Score 3   Pain Location Back   Pain Orientation Posterior;Right  less minimal   Pain Descriptors / Indicators Dull;Aching   Pain Type Chronic pain   Pain Onset More than a month ago   Pain Frequency Constant   Aggravating Factors  standing and walking   Pain Relieving Factors lying , heat ,            [facility] PT Assessment - 05/09/16 1543      Assessment   Medical Diagnosis dorsalgia, LT shoulder pain   Referring Provider Aura Dials, MD   Onset Date/Surgical Date --  Shoulder  this summer, back years ago.    Next MD Visit No set   Prior Therapy PT in past for back with benefit  for less pain and looser.  Did not cont HEP/      Precautions   Precautions None   Precaution Comments NO PRONE TREATMENT DUE TO BREAST PAIN     Restrictions   Weight Bearing Restrictions No     Prior Function   Level of Independence Independent  has a Science writer for heavy stuff once per month     Cognition   Overall Cognitive Status Within Functional Limits for tasks assessed     Observation/Other Assessments   Focus on Therapeutic Outcomes (FOTO)  49% limited     Posture/Postural Control   Posture Comments increased thoracic kyphosis     ROM / Strength   AROM / PROM / Strength AROM;Strength;PROM     AROM   AROM Assessment Site Shoulder;Lumbar   Right/Left Shoulder Right;Left   Right Shoulder Extension 40 Degrees   Right Shoulder Flexion 120 Degrees   Right Shoulder ABduction 130 Degrees   Right Shoulder Internal Rotation --  able to reach behind back = LT   Right Shoulder External Rotation 80 Degrees   Right  Shoulder Horizontal ABduction 20 Degrees   Right Shoulder Horizontal  ADduction 95 Degrees   Left Shoulder Flexion 115 Degrees   Left Shoulder ABduction 130 Degrees   Left Shoulder Internal Rotation --  able to reach behind back = RT   Left Shoulder External Rotation 72 Degrees   Left Shoulder Horizontal ABduction 10 Degrees   Left Shoulder Horizontal ADduction 95 Degrees   Lumbar Flexion 60   Lumbar Extension 15   Lumbar - Right Side Bend 20   Lumbar - Left Side Bend 20     PROM   Overall PROM Comments WNL with some general limitations bilaterally   PROM Assessment Site Shoulder   Right/Left Shoulder Left     Strength   Overall Strength Comments WNL on testing  without significant pain. LT shoulder ,, Decreased hp extension , and rotation RT and LT decreased    Strength Assessment Site Shoulder   Right/Left Shoulder Right;Left                   [facility] Adult PT Treatment/Exercise - 05/09/16 1543      Ambulation/Gait   Gait Comments WNL     Exercises   Exercises Shoulder;Lumbar     Lumbar Exercises: Stretches   Single Knee to Chest Stretch 2 reps;30 seconds  RT and LT    Pelvic Tilt --  5 sec 10 reps     Shoulder Exercises: Standing   Other Standing Exercises self assisted overhead stretchx 2 then scapula retraction and depression x 10 over course of session.                 PT Education - 05/09/16 1638    Education provided Yes   Education Details POC HEP   Person(s) Educated Patient   Methods Explanation;Demonstration;Tactile cues;Verbal cues   Comprehension Returned demonstration;Verbalized understanding          PT Short Term Goals - 05/09/16 1643      PT SHORT TERM GOAL #1   Title she will be independnet with inital HEP    Time 3   Period Weeks   Status New     PT SHORT TERM GOAL #2   Title LT shoulder AROM will equal RT due to decreased pain   Time 3   Period Weeks   Status New     PT SHORT TERM GOAL #3  Title She will  report pain decreased 30 % or more in lower back.    Time 3   Period Weeks   Status New           PT Long Term Goals - 05/09/16 1645      PT LONG TERM GOAL #1   Title She will report  pain decreased 50% or more in lower back with report of increased time in standing to 45 min or more   Time 6   Period Weeks   Status New     PT LONG TERM GOAL #2   Title she will be independent with all HEP issued    Time 6   Period Weeks   Status New     PT LONG TERM GOAL #3   Title Active LT shoulder motion inproved with decreased pian so she is able to do normal self care   Time 6   Period Weeks   Status New     PT LONG TERM GOAL #4   Title she will be able to place cups and plates on upper shelves with min  1-2 shoulder pain.    Time 6   Period Weeks   Status New     PT LONG TERM GOAL #5   Title She will report back  and shoulder  pain improved so she is able to return to normal home tasks without limts due to pain    Time 6   Period Weeks   Status New     PT LONG TERM GOAL #6   Title she will report pain  decreased  to be able to sleep comfortable and spend time on Lt shoulder.    Time 6   Period Weeks   Status New               Plan - 05/09/16 1639    Clinical Impression Statement Brittney Wright presents for moderate complexity eval with complaints of LBP chronic and LT shoulder pain chronic . She reports decreased use of LT arm for self care due to spasm and decr ROM . She reports limtiation on feet due to  LBP and  feeling stiff .    Rehab Potential Good   PT Frequency 2x / week   PT Duration 6 weeks   PT Treatment/Interventions Electrical Stimulation;Iontophoresis 4mg /ml Dexamethasone;Moist Heat;Ultrasound;Passive range of motion;Patient/family education;Taping;Manual techniques;Therapeutic exercise;Dry needling   PT Next Visit Plan Manual and modalities for pain and stiffness. Add to  HEP.    SHE DOES NOT LIKE TO LYE PRONE DUE TO BREAST PAIN   PT Home Exercise Plan  lOWER BACK STRETCHES, PELVIC TILT, SHOULDER ROM   Consulted and Agree with Plan of Care Patient      Patient will benefit from skilled therapeutic intervention in order to improve the following deficits and impairments:  Decreased activity tolerance, Pain, Postural dysfunction, Increased muscle spasms, Difficulty walking, Decreased range of motion  Visit Diagnosis: Dorsalgia of lumbar region - Plan: PT plan of care cert/re-cert  Left shoulder pain, unspecified chronicity - Plan: PT plan of care cert/re-cert  Stiffness of left shoulder, not elsewhere classified - Plan: PT plan of care cert/re-cert  Stiffness of left hip, not elsewhere classified - Plan: PT plan of care cert/re-cert  Stiffness of right hip, not elsewhere classified - Plan: PT plan of care cert/re-cert      G-Codes - 23/55/73 1654    Functional Assessment Tool Used FOTO   Functional Limitation Other PT primary  Other PT Primary Current Status [phone]) At least 40 percent but less than 60 percent impaired, limited or restricted   Other PT Primary Goal Status ([id number]) At least 20 percent but less than 40 percent impaired, limited or restricted       Problem List Patient Active Problem List   Diagnosis Date Noted  . Breast cancer of upper-outer quadrant of left female breast ([place]) 01/13/2012    Darrel Hoover  PT 05/09/2016, 4:57 PM  [facility] [address] [place], [place], [address] Phone: [phone]   Fax:  [phone]  Name: Brittney Wright MRN: [id number] Date of Birth: 1934/11/03

## 2016-05-16 DIAGNOSIS — D649 Anemia, unspecified: Secondary | ICD-10-CM | POA: Diagnosis not present

## 2016-05-16 DIAGNOSIS — Z7984 Long term (current) use of oral hypoglycemic drugs: Secondary | ICD-10-CM | POA: Diagnosis not present

## 2016-05-16 DIAGNOSIS — Z86010 Personal history of colonic polyps: Secondary | ICD-10-CM | POA: Diagnosis not present

## 2016-05-16 DIAGNOSIS — N184 Chronic kidney disease, stage 4 (severe): Secondary | ICD-10-CM | POA: Diagnosis not present

## 2016-05-16 DIAGNOSIS — E1122 Type 2 diabetes mellitus with diabetic chronic kidney disease: Secondary | ICD-10-CM | POA: Diagnosis not present

## 2016-05-17 ENCOUNTER — Ambulatory Visit: Payer: PPO

## 2016-05-17 DIAGNOSIS — M545 Low back pain, unspecified: Secondary | ICD-10-CM

## 2016-05-17 DIAGNOSIS — M25651 Stiffness of right hip, not elsewhere classified: Secondary | ICD-10-CM

## 2016-05-17 DIAGNOSIS — M25652 Stiffness of left hip, not elsewhere classified: Secondary | ICD-10-CM

## 2016-05-17 DIAGNOSIS — M25612 Stiffness of left shoulder, not elsewhere classified: Secondary | ICD-10-CM

## 2016-05-17 DIAGNOSIS — M25512 Pain in left shoulder: Secondary | ICD-10-CM

## 2016-05-17 NOTE — Therapy (Signed)
[place], [place], [address] Phone: [phone]   Fax:  [phone]  Physical Therapy Treatment  Patient Details  Name: Brittney Wright MRN: [id number] Date of Birth: 22-Apr-1935 Referring Provider: Aura Dials, MD  Encounter Date: 05/17/2016      PT End of Session - 05/17/16 1531    Visit Number 2   Number of Visits 12   Date for PT Re-Evaluation 06/20/16   Authorization Type Medicare   PT Start Time 0215   PT Stop Time 0315   PT Time Calculation (min) 60 min   Activity Tolerance Patient tolerated treatment well;No increased pain   Behavior During Therapy WFL for tasks assessed/performed      Past Medical History:  Diagnosis Date  . Back pain   . 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 Cataract   . Depression   . Diabetes mellitus   . History of radiation therapy 03/28/12- 04/26/12   left breast 4250 cGy 17 sessions, left breast boost 750 cGy 3 sessions  . Hot flashes   . Hypertension   . SOB (shortness of breath) on exertion     Past Surgical History:  Procedure Laterality Date  . BREAST SURGERY  1976   rt br bx  . CATARACT EXTRACTION Bilateral    08/2011 and 05/2011  . COLONOSCOPY    . DILATION AND CURETTAGE OF UTERUS    . EYE SURGERY  12,13   cataracts-both    There were no vitals filed for this visit.      Subjective Assessment - 05/17/16 1421    Subjective Hurt back over wekend due to different matteress.    Currently in Pain? Yes   Pain Score 5    Pain Location Shoulder   Pain Orientation Left   Pain Descriptors / Indicators Aching;Dull   Pain Type Chronic pain   Pain Onset More than a month ago   Pain Frequency Constant   Aggravating Factors  use of arm   Pain Relieving Factors rest heat   Pain Score 5   Pain Location Back   Pain Orientation Posterior;Right   Pain Descriptors / Indicators Aching;Dull   Pain Type Chronic pain   Pain Onset More than a  month ago   Pain Frequency Constant   Aggravating Factors  stand walk   Pain Relieving Factors lye heat                         [facility] Adult PT Treatment/Exercise - 05/17/16 0001      Lumbar Exercises: Aerobic   Stationary Bike Nustep L5 UE and LE  6 min     Modalities   Modalities Ultrasound;Moist Heat;Electrical Stimulation     Moist Heat Therapy   Number Minutes Moist Heat 15 Minutes   Moist Heat Location Lumbar Spine;Cervical     Ultrasound   Ultrasound Location LT neck and RT lower back   Ultrasound Parameters 1.6 Wcm2, 100% , 1MHZ   Ultrasound Goals Pain     Manual Therapy   Manual Therapy Soft tissue mobilization;Joint mobilization;Passive ROM;Manual Traction;Taping   Soft tissue mobilization With tool to LT neck and RT lower back with sustiainged pressure to point for 30 sec x 2                   PT Short Term Goals - 05/09/16 1643      PT SHORT TERM GOAL #  1   Title she will be independnet with inital HEP    Time 3   Period Weeks   Status New     PT SHORT TERM GOAL #2   Title LT shoulder AROM will equal RT due to decreased pain   Time 3   Period Weeks   Status New     PT SHORT TERM GOAL #3   Title She will report pain decreased 30 % or more in lower back.    Time 3   Period Weeks   Status New           PT Long Term Goals - 05/09/16 1645      PT LONG TERM GOAL #1   Title She will report  pain decreased 50% or more in lower back with report of increased time in standing to 45 min or more   Time 6   Period Weeks   Status New     PT LONG TERM GOAL #2   Title she will be independent with all HEP issued    Time 6   Period Weeks   Status New     PT LONG TERM GOAL #3   Title Active LT shoulder motion inproved with decreased pian so she is able to do normal self care   Time 6   Period Weeks   Status New     PT LONG TERM GOAL #4   Title she will be able to place cups and plates on upper shelves with min  1-2 shoulder  pain.    Time 6   Period Weeks   Status New     PT LONG TERM GOAL #5   Title She will report back  and shoulder  pain improved so she is able to return to normal home tasks without limts due to pain    Time 6   Period Weeks   Status New     PT LONG TERM GOAL #6   Title she will report pain  decreased  to be able to sleep comfortable and spend time on Lt shoulder.    Time 6   Period Weeks   Status New               Plan - 05/17/16 1531    Clinical Impression Statement She reported feeling better after session. Continue modalites and manual  and add stretches to HEP   PT Next Visit Plan Manual and modalities for pain and stiffness. Add to  HEP.    SHE DOES NOT LIKE TO LYE PRONE DUE TO BREAST PAIN   PT Home Exercise Plan lOWER BACK STRETCHES, PELVIC TILT, SHOULDER ROM   Consulted and Agree with Plan of Care Patient      Patient will benefit from skilled therapeutic intervention in order to improve the following deficits and impairments:  Decreased activity tolerance, Pain, Postural dysfunction, Increased muscle spasms, Difficulty walking, Decreased range of motion  Visit Diagnosis: Left shoulder pain, unspecified chronicity  Stiffness of left shoulder, not elsewhere classified  Stiffness of left hip, not elsewhere classified  Stiffness of right hip, not elsewhere classified  Dorsalgia of lumbar region     Problem List Patient Active Problem List   Diagnosis Date Noted  . Breast cancer of upper-outer quadrant of left female breast ([place]) 01/13/2012    Darrel Hoover  PT 05/17/2016, 3:33 PM  [place] [facility] [address] [place], [place], [address] Phone: [phone]   Fax:  [phone]  Name: Brittney Wright MRN: [id number] Date of Birth: [birth year]

## 2016-05-19 ENCOUNTER — Ambulatory Visit: Payer: PPO

## 2016-05-19 DIAGNOSIS — M25651 Stiffness of right hip, not elsewhere classified: Secondary | ICD-10-CM

## 2016-05-19 DIAGNOSIS — M545 Low back pain, unspecified: Secondary | ICD-10-CM

## 2016-05-19 DIAGNOSIS — M25612 Stiffness of left shoulder, not elsewhere classified: Secondary | ICD-10-CM

## 2016-05-19 DIAGNOSIS — M25512 Pain in left shoulder: Secondary | ICD-10-CM

## 2016-05-19 DIAGNOSIS — M25652 Stiffness of left hip, not elsewhere classified: Secondary | ICD-10-CM

## 2016-05-19 NOTE — Therapy (Signed)
[place], [place], [address] Phone: [phone]   Fax:  [phone]  Physical Therapy Treatment  Patient Details  Name: Brittney Wright MRN: [id number] Date of Birth: [birth year] Referring Provider: Aura Dials, MD  Encounter Date: 05/19/2016      PT End of Session - 05/19/16 1518    Visit Number 3   Number of Visits 12   Date for PT Re-Evaluation 06/20/16   Authorization Type Medicare   PT Start Time 0215   PT Stop Time 0315   PT Time Calculation (min) 60 min   Activity Tolerance Patient tolerated treatment well;No increased pain   Behavior During Therapy WFL for tasks assessed/performed      Past Medical History:  Diagnosis Date  . Back pain   . 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 Cataract   . Depression   . Diabetes mellitus   . History of radiation therapy 03/28/12- 04/26/12   left breast 4250 cGy 17 sessions, left breast boost 750 cGy 3 sessions  . Hot flashes   . Hypertension   . SOB (shortness of breath) on exertion     Past Surgical History:  Procedure Laterality Date  . BREAST SURGERY  1976   rt br bx  . CATARACT EXTRACTION Bilateral    08/2011 and 05/2011  . COLONOSCOPY    . DILATION AND CURETTAGE OF UTERUS    . EYE SURGERY  12,13   cataracts-both    There were no vitals filed for this visit.      Subjective Assessment - 05/19/16 1427    Subjective Doing ok today                         [facility] Adult PT Treatment/Exercise - 05/19/16 0001      Neck Exercises: Supine   Capital Flexion 10 reps;15 secs   Cervical Rotation Right;Left;5 reps   Lateral Flexion Right;Left;5 reps     Lumbar Exercises: Stretches   Single Knee to Chest Stretch 2 reps;30 seconds  RT leg 1x/LT   Lower Trunk Rotation 2 reps;30 seconds  RT and LLT   Pelvic Tilt --  10 reps  5-10 [place]     Lumbar Exercises: Aerobic   Stationary Bike Nustep L5 UE and LE  6 min      Moist Heat Therapy   Number Minutes Moist Heat 15 Minutes   Moist Heat Location Lumbar Spine;Cervical     Ultrasound   Ultrasound Location RT lower back    Ultrasound Parameters 1.6 W cm 2  !Mhz,  100%   Ultrasound Goals Pain     Manual Therapy   Manual Therapy Passive ROM;Manual Traction   Soft tissue mobilization With tool to RT lower back with sustiainged pressure to point for 30 sec x 2    Passive ROM RT hip rotation and adduction x 45 sec each    Manual Traction Long axis traction pull RT leg 10 x 20 oscillations at 30 degrees and 90 degrees.                   PT Short Term Goals - 05/19/16 1521      PT SHORT TERM GOAL #1   Title she will be independent with inital HEP      PT SHORT TERM GOAL #2   Title LT shoulder AROM will equal RT due to decreased pain   Status  On-going     PT SHORT TERM GOAL #3   Title She will report pain decreased 30 % or more in lower back.    Baseline 20%   Status Partially Met           PT Long Term Goals - 05/09/16 1645      PT LONG TERM GOAL #1   Title She will report  pain decreased 50% or more in lower back with report of increased time in standing to 45 min or more   Time 6   Period Weeks   Status New     PT LONG TERM GOAL #2   Title she will be independent with all HEP issued    Time 6   Period Weeks   Status New     PT LONG TERM GOAL #3   Title Active LT shoulder motion inproved with decreased pian so she is able to do normal self care   Time 6   Period Weeks   Status New     PT LONG TERM GOAL #4   Title she will be able to place cups and plates on upper shelves with min  1-2 shoulder pain.    Time 6   Period Weeks   Status New     PT LONG TERM GOAL #5   Title She will report back  and shoulder  pain improved so she is able to return to normal home tasks without limts due to pain    Time 6   Period Weeks   Status New     PT LONG TERM GOAL #6   Title she will report pain  decreased  to be able to  sleep comfortable and spend time on Lt shoulder.    Time 6   Period Weeks   Status New               Plan - 05/19/16 1518    Clinical Impression Statement RT back much better after session . No incr pain. No [place] to neck due to time constraints. Improveing some with pain.    PT Treatment/Interventions Electrical Stimulation;Iontophoresis 71m/ml Dexamethasone;Moist Heat;Ultrasound;Passive range of motion;Patient/family education;Taping;Manual techniques;Therapeutic exercise;Dry needling   PT Next Visit Plan Manual and modalities for pain and stiffness. Add to  HEP.    SHE DOES NOT LIKE TO LYE PRONE DUE TO BREAST PAIN   PT Home Exercise Plan lOWER BACK STRETCHES, PELVIC TILT, SHOULDER ROM   Consulted and Agree with Plan of Care Patient      Patient will benefit from skilled therapeutic intervention in order to improve the following deficits and impairments:  Decreased activity tolerance, Pain, Postural dysfunction, Increased muscle spasms, Difficulty walking, Decreased range of motion  Visit Diagnosis: Left shoulder pain, unspecified chronicity  Stiffness of left shoulder, not elsewhere classified  Stiffness of left hip, not elsewhere classified  Stiffness of right hip, not elsewhere classified  Dorsalgia of lumbar region     Problem List Patient Active Problem List   Diagnosis Date Noted  . [place]    CDarrel Hoover PT 05/19/2016, 3:58 PM  [id number] [address]Phone: [phone]  Fax:  [phone] Name: Brittney ZENDEJASMRN: [id number] of Birth: 11/26/1934-11-05

## 2016-05-24 ENCOUNTER — Ambulatory Visit: Payer: PPO | Attending: Family Medicine

## 2016-05-24 DIAGNOSIS — M25612 Stiffness of left shoulder, not elsewhere classified: Secondary | ICD-10-CM | POA: Insufficient documentation

## 2016-05-24 DIAGNOSIS — M25651 Stiffness of right hip, not elsewhere classified: Secondary | ICD-10-CM | POA: Insufficient documentation

## 2016-05-24 DIAGNOSIS — M545 Low back pain, unspecified: Secondary | ICD-10-CM

## 2016-05-24 DIAGNOSIS — M25652 Stiffness of left hip, not elsewhere classified: Secondary | ICD-10-CM | POA: Insufficient documentation

## 2016-05-24 DIAGNOSIS — M25512 Pain in left shoulder: Secondary | ICD-10-CM

## 2016-05-24 NOTE — Therapy (Signed)
[place], [place], [address] Phone: [phone]   Fax:  [phone]  Physical Therapy Treatment  Patient Details  Name: Brittney Wright MRN: [id number] Date of Birth: 07-Mar-1935 Referring Provider: Aura Dials, MD  Encounter Date: 05/24/2016      PT End of Session - 05/24/16 1330    Visit Number 4   Number of Visits 12   Date for PT Re-Evaluation 06/20/16   Authorization Type Medicare   PT Start Time 0215   PT Stop Time 0315   PT Time Calculation (min) 60 min   Activity Tolerance Patient tolerated treatment well   Behavior During Therapy [facility] for tasks assessed/performed      Past Medical History:  Diagnosis Date  . Back pain   . 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 Cataract   . Depression   . Diabetes mellitus   . History of radiation therapy 03/28/12- 04/26/12   left breast 4250 cGy 17 sessions, left breast boost 750 cGy 3 sessions  . Hot flashes   . Hypertension   . SOB (shortness of breath) on exertion     Past Surgical History:  Procedure Laterality Date  . BREAST SURGERY  1976   rt br bx  . CATARACT EXTRACTION Bilateral    08/2011 and 05/2011  . COLONOSCOPY    . DILATION AND CURETTAGE OF UTERUS    . EYE SURGERY  12,13   cataracts-both    There were no vitals filed for this visit.      Subjective Assessment - 05/24/16 1335    Subjective Felt better after last session    Currently in Pain? Yes   Pain Score 5    Pain Location Neck   Pain Orientation Left   Pain Descriptors / Indicators Aching;Dull   Pain Type Chronic pain   Pain Onset More than a month ago   Pain Frequency Constant   Aggravating Factors  using arm   Pain Relieving Factors rest   Pain Score 4   Pain Location Back   Pain Orientation Right;Posterior   Pain Descriptors / Indicators Aching;Dull   Pain Type Chronic pain   Pain Onset More than a month ago   Pain Frequency Constant    Aggravating Factors  stand /walk   Pain Relieving Factors rest [employer] [facility] Adult PT Treatment/Exercise - 05/24/16 0001      Neck Exercises: Supine   Capital Flexion 10 reps;15 secs   Cervical Rotation Right;Left;5 reps   Lateral Flexion Right;Left;5 reps   Other Supine Exercise supine band exercises ye;;ow  x 12 hor abduction and ER  x 12 reps     Lumbar Exercises: Stretches   Pelvic Tilt 10 seconds  10 reps     Lumbar Exercises: Aerobic   Stationary Bike Nustep L6 UE and LE  6 min     Moist Heat Therapy   Number Minutes Moist Heat 15 Minutes   Moist Heat Location Lumbar Spine;Cervical     Ultrasound   Ultrasound Location LT neck   Ultrasound Parameters 1.6 Wcm2  1MHz   100%   Ultrasound Goals Pain     Manual Therapy   Soft tissue mobilization With tool to LT neck     Passive ROM RT hip rotation and adduction x 45 sec each  Manual Traction Long axis traction pull RT leg 10 x 20 oscillations at 30 degrees and 90 degrees.                   PT Short Term Goals - 05/19/16 1521      PT SHORT TERM GOAL #1   Title she will be independent with inital HEP      PT SHORT TERM GOAL #2   Title LT shoulder AROM will equal RT due to decreased pain   Status On-going     PT SHORT TERM GOAL #3   Title She will report pain decreased 30 % or more in lower back.    Baseline 20%   Status Partially Met           PT Long Term Goals - 05/09/16 1645      PT LONG TERM GOAL #1   Title She will report  pain decreased 50% or more in lower back with report of increased time in standing to 45 min or more   Time 6   Period Weeks   Status New     PT LONG TERM GOAL #2   Title she will be independent with all HEP issued    Time 6   Period Weeks   Status New     PT LONG TERM GOAL #3   Title Active LT shoulder motion inproved with decreased pian so she is able to do normal self care   Time 6   Period Weeks   Status New      PT LONG TERM GOAL #4   Title she will be able to place cups and plates on upper shelves with min  1-2 shoulder pain.    Time 6   Period Weeks   Status New     PT LONG TERM GOAL #5   Title She will report back  and shoulder  pain improved so she is able to return to normal home tasks without limts due to pain    Time 6   Period Weeks   Status New     PT LONG TERM GOAL #6   Title she will report pain  decreased  to be able to sleep comfortable and spend time on Lt shoulder.    Time 6   Period Weeks   Status New               Plan - 05/24/16 1334    Clinical Impression Statement Improving and felt good after last session but pain levels still moderate. Will spend more time on lower back next session.    PT Treatment/Interventions Electrical Stimulation;Iontophoresis 59m/ml Dexamethasone;Moist Heat;Ultrasound;Passive range of motion;Patient/family education;Taping;Manual techniques;Therapeutic exercise;Dry needling   PT Next Visit Plan Manual and modalities for pain and stiffness. Add to  HEP.    SHE DOES NOT LIKE TO LYE PRONE DUE TO BREAST PAIN   PT Home Exercise Plan lOWER BACK STRETCHES, PELVIC TILT, SHOULDER ROM   Consulted and Agree with Plan of Care Patient      Patient will benefit from skilled therapeutic intervention in order to improve the following deficits and impairments:  Decreased activity tolerance, Pain, Postural dysfunction, Increased muscle spasms, Difficulty walking, Decreased range of motion  Visit Diagnosis: Stiffness of left shoulder, not elsewhere classified  Stiffness of left hip, not elsewhere classified  Left shoulder pain, unspecified chronicity  Stiffness of right hip, not elsewhere classified  Dorsalgia of lumbar region     Problem List Patient Active  Problem List   Diagnosis Date Noted  . Breast cancer of upper-outer quadrant of left female breast ([place]) 01/13/2012    Darrel Hoover  PT 05/24/2016, 5:49 PM  [facility] [facility] [address] [place], [place], [address] Phone: [phone]   Fax:  [phone]  Name: Brittney Wright MRN: [id number] Date of Birth: 13-Dec-1934

## 2016-05-26 ENCOUNTER — Ambulatory Visit: Payer: PPO | Admitting: Physical Therapy

## 2016-05-31 ENCOUNTER — Ambulatory Visit: Payer: PPO

## 2016-06-02 ENCOUNTER — Ambulatory Visit: Payer: PPO

## 2016-06-02 DIAGNOSIS — M25612 Stiffness of left shoulder, not elsewhere classified: Secondary | ICD-10-CM

## 2016-06-02 DIAGNOSIS — M545 Low back pain, unspecified: Secondary | ICD-10-CM

## 2016-06-02 DIAGNOSIS — M25651 Stiffness of right hip, not elsewhere classified: Secondary | ICD-10-CM

## 2016-06-02 DIAGNOSIS — M25652 Stiffness of left hip, not elsewhere classified: Secondary | ICD-10-CM

## 2016-06-02 DIAGNOSIS — M25512 Pain in left shoulder: Secondary | ICD-10-CM

## 2016-06-03 NOTE — Therapy (Signed)
[place], [place], [address] Phone: [phone]   Fax:  [phone]  Physical Therapy Treatment  Patient Details  Name: Brittney Wright MRN: [id number] Date of Birth: [birth year] Referring Provider: Aura Dials, MD  Encounter Date: 06/02/2016    Past Medical History:  Diagnosis Date  . Back pain   . 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 Cataract   . Depression   . Diabetes mellitus   . History of radiation therapy 03/28/12- 04/26/12   left breast 4250 cGy 17 sessions, left breast boost 750 cGy 3 sessions  . Hot flashes   . Hypertension   . SOB (shortness of breath) on exertion     Past Surgical History:  Procedure Laterality Date  . BREAST SURGERY  1976   rt br bx  . CATARACT EXTRACTION Bilateral    08/2011 and 05/2011  . COLONOSCOPY    . DILATION AND CURETTAGE OF UTERUS    . EYE SURGERY  12,13   cataracts-both    There were no vitals filed for this visit.      Subjective Assessment - 06/02/16 1427    Subjective Better but back hurt snore from being inactive while ill   Currently in Pain? Yes   Pain Score 3    Pain Location Neck   Pain Orientation Left   Pain Descriptors / Indicators Aching;Dull   Pain Type Chronic pain   Pain Onset More than a month ago   Pain Frequency Constant   Aggravating Factors  using arm   Pain Score 5   Pain Location Back   Pain Orientation Right;Posterior   Pain Descriptors / Indicators Aching;Dull   Pain Type Chronic pain   Pain Onset More than a month ago   Pain Frequency Constant   Aggravating Factors  stand /walk   Pain Relieving Factors rest heat                         [facility] Adult PT Treatment/Exercise - 06/02/16 0001      Ultrasound   Ultrasound Location RT lower back   Ultrasound Parameters 1.6 Wcm@  , 1MHz 100%   Ultrasound Goals Pain     Manual Therapy   Soft tissue mobilization With tool  RTR back      Nustep L5 UE and LE  8 min.. [place] x 10 min RT lower back, Manual stretching QL on RT    HMP to neck and back         PT Short Term Goals - 05/19/16 1521      PT SHORT TERM GOAL #1   Title she will be independent with inital HEP      PT SHORT TERM GOAL #2   Title LT shoulder AROM will equal RT due to decreased pain   Status On-going     PT SHORT TERM GOAL #3   Title She will report pain decreased 30 % or more in lower back.    Baseline 20%   Status Partially Met           PT Long Term Goals - 06/03/16 0641      PT LONG TERM GOAL #1   Title She will report  pain decreased 50% or more in lower back with report of increased time in standing to 45 min or more   Status On-going     PT LONG TERM GOAL #2  Title she will be independent with all HEP issued    Status On-going     PT LONG TERM GOAL #3   Title Active LT shoulder motion inproved with decreased pian so she is able to do normal self care   Status Unable to assess     PT LONG TERM GOAL #4   Title she will be able to place cups and plates on upper shelves with min  1-2 shoulder pain.    Status On-going     PT LONG TERM GOAL #5   Status On-going     PT LONG TERM GOAL #6   Title she will report pain  decreased  to be able to sleep comfortable and spend time on Lt shoulder.    Status On-going             Patient will benefit from skilled therapeutic intervention in order to improve the following deficits and impairments:     Visit Diagnosis: Stiffness of left shoulder, not elsewhere classified  Stiffness of left hip, not elsewhere classified  Left shoulder pain, unspecified chronicity  Stiffness of right hip, not elsewhere classified  Dorsalgia of lumbar region     Problem List Patient Active Problem List   Diagnosis Date Noted  . Breast cancer of upper-outer quadrant of left female breast ([place]) 01/13/2012    Darrel Hoover   PT 06/03/2016, 6:42 AM  [facility] [address] [place], [place], [address] Phone: [phone]   Fax:  [phone]  Name: ELDORIS BEISER MRN: [id number] Date of Birth: 03-Dec-1934

## 2016-06-07 ENCOUNTER — Ambulatory Visit: Payer: PPO

## 2016-06-07 DIAGNOSIS — M25612 Stiffness of left shoulder, not elsewhere classified: Secondary | ICD-10-CM

## 2016-06-07 DIAGNOSIS — M25512 Pain in left shoulder: Secondary | ICD-10-CM

## 2016-06-07 DIAGNOSIS — M545 Low back pain, unspecified: Secondary | ICD-10-CM

## 2016-06-07 DIAGNOSIS — M25652 Stiffness of left hip, not elsewhere classified: Secondary | ICD-10-CM

## 2016-06-07 DIAGNOSIS — Z1211 Encounter for screening for malignant neoplasm of colon: Secondary | ICD-10-CM | POA: Diagnosis not present

## 2016-06-07 DIAGNOSIS — M25651 Stiffness of right hip, not elsewhere classified: Secondary | ICD-10-CM

## 2016-06-07 NOTE — Therapy (Addendum)
[place], [place], [address] Phone: [phone]   Fax:  [phone]  Physical Therapy Treatment/Discharge  Patient Details  Name: Brittney Wright MRN: [id number] Date of Birth: [birth year] Referring Provider: Aura Dials, MD  Encounter Date: 06/07/2016      PT End of Session - 06/07/16 1327    Visit Number 6   Number of Visits 12   Date for PT Re-Evaluation 06/20/16   Authorization Type Medicare   PT Start Time 0130   PT Stop Time 0225   PT Time Calculation (min) 55 min   Activity Tolerance Patient tolerated treatment well   Behavior During Therapy [facility] for tasks assessed/performed      Past Medical History:  Diagnosis Date  . Back pain   . 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 Cataract   . Depression   . Diabetes mellitus   . History of radiation therapy 03/28/12- 04/26/12   left breast 4250 cGy 17 sessions, left breast boost 750 cGy 3 sessions  . Hot flashes   . Hypertension   . SOB (shortness of breath) on exertion     Past Surgical History:  Procedure Laterality Date  . BREAST SURGERY  1976   rt br bx  . CATARACT EXTRACTION Bilateral    08/2011 and 05/2011  . COLONOSCOPY    . DILATION AND CURETTAGE OF UTERUS    . EYE SURGERY  12,13   cataracts-both    There were no vitals filed for this visit.                       [facility] Adult PT Treatment/Exercise - 06/07/16 1335      Lumbar Exercises: Stretches   Pelvic Tilt Limitations 2x10 reps x 5 sec      Lumbar Exercises: Aerobic   Stationary Bike Nustep L5 UE and LE  6 min     Ultrasound   Ultrasound Location Lt  neck and traps    Ultrasound Parameters 1.6Wcm2, 100% 1 MHz   Ultrasound Goals Pain     Manual Therapy   Soft tissue mobilization With tool Lt neck   Passive ROM RT hip rotation and adduction x 45 sec each and neck rotation and side bend   Manual Traction Long axis traction pull RT leg 10  x 20 oscillations at 30 degrees and 90 degrees.      Neck Exercises: Stretches   Upper Trapezius Stretch 2 reps;20 seconds   Levator Stretch 2 reps;20 seconds     Nustep L5 UE and LE 6 min             PT Short Term Goals - 06/07/16 1333      PT SHORT TERM GOAL #1   Title she will be independent with inital HEP    Status Achieved     PT SHORT TERM GOAL #2   Title LT shoulder AROM will equal RT due to decreased pain     PT SHORT TERM GOAL #3   Title She will report pain decreased 30 % or more in lower back.    Baseline 25%   Status Partially Met           PT Long Term Goals - 06/07/16 1333      PT LONG TERM GOAL #1   Title She will report  pain decreased 50% or more in lower back with report of increased time in standing  to 45 min or more   Status On-going     PT LONG TERM GOAL #2   Title she will be independent with all HEP issued    Status On-going     PT LONG TERM GOAL #3   Title Active LT shoulder motion inproved with decreased pain so she is able to do normal self care   Status On-going     PT LONG TERM GOAL #4   Title she will be able to place cups and plates on upper shelves with min  1-2 LT shoulder pain.    Status On-going     PT LONG TERM GOAL #5   Title She will report back  and shoulder  pain improved so she is able to return to normal home tasks without limts due to pain    Status On-going     PT LONG TERM GOAL #6   Title she will report pain  decreased  to be able to sleep comfortable and spend time on Lt shoulder.    Status On-going               Plan - 06/07/16 1329    Clinical Impression Statement Neck and shoulder pain today moderate . Pitched forward and fell on Lt shoulder today. Consequently most goals not met due to increased pain.    PT Treatment/Interventions Electrical Stimulation;Iontophoresis 66m/ml Dexamethasone;Moist Heat;Ultrasound;Passive range of motion;Patient/family education;Taping;Manual techniques;Therapeutic  exercise;Dry needling   PT Next Visit Plan Manual and modalities for pain and stiffness. Add to  HEP.    SHE DOES NOT LIKE TO LYE PRONE DUE TO BREAST PAIN    Measurements LT shoulder   PT Home Exercise Plan lOWER BACK STRETCHES, PELVIC TILT, SHOULDER ROM   Consulted and Agree with Plan of Care Patient      Patient will benefit from skilled therapeutic intervention in order to improve the following deficits and impairments:  Decreased activity tolerance, Pain, Postural dysfunction, Increased muscle spasms, Difficulty walking, Decreased range of motion  Visit Diagnosis: Dorsalgia of lumbar region  Stiffness of left hip, not elsewhere classified  Left shoulder pain, unspecified chronicity  Stiffness of right hip, not elsewhere classified  Stiffness of left shoulder, not elsewhere classified     Problem List Patient Active Problem List   Diagnosis Date Noted  . [facility] upper-outer quadrant [facility]    CDarrel Hoover PT 06/07/2016, 3:25 PM  [facility] [address]Phone: [phone]  Fax:  [phone] Name: Brittney DUMLERMRN: [id number] of Birth: [birth year] PHYSICAL THERAPY DISCHARGE SUMMARY  Visits from Start of Care: 6  Current functional level related to goals / functional outcomes: Unknown as she canceled appointments being sick but has not returned and she is 4 week post original plan of care so she will need a new order to return to PT   Remaining deficits: Unknown   Education / Equipment: HEP  Plan:                                                    Patient goals were not met. Patient is being discharged due to not returning since the last visit.  ?????     SLillette BoxerChasse  PT  07/19/16

## 2016-06-09 ENCOUNTER — Ambulatory Visit: Payer: PPO

## 2016-06-09 ENCOUNTER — Telehealth: Payer: Self-pay | Admitting: *Deleted

## 2016-06-09 DIAGNOSIS — N184 Chronic kidney disease, stage 4 (severe): Secondary | ICD-10-CM | POA: Diagnosis not present

## 2016-06-09 DIAGNOSIS — E1142 Type 2 diabetes mellitus with diabetic polyneuropathy: Secondary | ICD-10-CM | POA: Diagnosis not present

## 2016-06-09 DIAGNOSIS — K921 Melena: Secondary | ICD-10-CM | POA: Diagnosis not present

## 2016-06-09 DIAGNOSIS — N183 Chronic kidney disease, stage 3 (moderate): Secondary | ICD-10-CM | POA: Diagnosis not present

## 2016-06-09 DIAGNOSIS — Z7984 Long term (current) use of oral hypoglycemic drugs: Secondary | ICD-10-CM | POA: Diagnosis not present

## 2016-06-09 DIAGNOSIS — F419 Anxiety disorder, unspecified: Secondary | ICD-10-CM | POA: Diagnosis not present

## 2016-06-09 DIAGNOSIS — E1122 Type 2 diabetes mellitus with diabetic chronic kidney disease: Secondary | ICD-10-CM | POA: Diagnosis not present

## 2016-06-09 DIAGNOSIS — E113299 Type 2 diabetes mellitus with mild nonproliferative diabetic retinopathy without macular edema, unspecified eye: Secondary | ICD-10-CM | POA: Diagnosis not present

## 2016-06-09 DIAGNOSIS — Z86010 Personal history of colonic polyps: Secondary | ICD-10-CM | POA: Diagnosis not present

## 2016-06-09 NOTE — Telephone Encounter (Signed)
Called pt to inform her of appt for mammo scheduled for 01/06/17 at 1:00p at [facility]. Pt verbalized understanding. No further concerns. Message to be fwd to G.Dawson,NP.

## 2016-06-16 ENCOUNTER — Ambulatory Visit: Payer: PPO | Admitting: Physical Therapy

## 2016-06-21 ENCOUNTER — Ambulatory Visit: Payer: PPO

## 2016-06-23 ENCOUNTER — Ambulatory Visit: Payer: PPO

## 2016-06-28 ENCOUNTER — Ambulatory Visit: Payer: PPO

## 2016-06-30 ENCOUNTER — Ambulatory Visit: Payer: PPO

## 2016-07-05 ENCOUNTER — Ambulatory Visit: Payer: PPO

## 2016-07-05 ENCOUNTER — Other Ambulatory Visit: Payer: Self-pay | Admitting: *Deleted

## 2016-07-05 MED ORDER — TAMOXIFEN CITRATE 20 MG PO TABS: 20.0000 mg | ORAL_TABLET | Freq: Every day | ORAL | 3 refills | Status: DC

## 2016-07-07 ENCOUNTER — Ambulatory Visit: Payer: PPO

## 2016-08-03 ENCOUNTER — Other Ambulatory Visit: Payer: Self-pay | Admitting: Gastroenterology

## 2016-08-03 DIAGNOSIS — K921 Melena: Secondary | ICD-10-CM

## 2016-08-05 DIAGNOSIS — H402231 Chronic angle-closure glaucoma, bilateral, mild stage: Secondary | ICD-10-CM | POA: Diagnosis not present

## 2016-08-05 DIAGNOSIS — H02109 Unspecified ectropion of unspecified eye, unspecified eyelid: Secondary | ICD-10-CM | POA: Diagnosis not present

## 2016-08-05 DIAGNOSIS — H1013 Acute atopic conjunctivitis, bilateral: Secondary | ICD-10-CM | POA: Diagnosis not present

## 2016-08-26 DIAGNOSIS — K921 Melena: Secondary | ICD-10-CM | POA: Diagnosis not present

## 2016-09-14 DIAGNOSIS — N183 Chronic kidney disease, stage 3 (moderate): Secondary | ICD-10-CM | POA: Diagnosis not present

## 2016-09-14 DIAGNOSIS — E1129 Type 2 diabetes mellitus with other diabetic kidney complication: Secondary | ICD-10-CM | POA: Diagnosis not present

## 2016-09-14 DIAGNOSIS — D631 Anemia in chronic kidney disease: Secondary | ICD-10-CM | POA: Diagnosis not present

## 2016-09-14 DIAGNOSIS — N2581 Secondary hyperparathyroidism of renal origin: Secondary | ICD-10-CM | POA: Diagnosis not present

## 2016-09-14 DIAGNOSIS — Z6837 Body mass index (BMI) 37.0-37.9, adult: Secondary | ICD-10-CM | POA: Diagnosis not present

## 2016-09-14 DIAGNOSIS — N189 Chronic kidney disease, unspecified: Secondary | ICD-10-CM | POA: Diagnosis not present

## 2016-09-14 DIAGNOSIS — I129 Hypertensive chronic kidney disease with stage 1 through stage 4 chronic kidney disease, or unspecified chronic kidney disease: Secondary | ICD-10-CM | POA: Diagnosis not present

## 2016-09-21 DIAGNOSIS — N390 Urinary tract infection, site not specified: Secondary | ICD-10-CM | POA: Diagnosis not present

## 2016-09-29 DIAGNOSIS — I129 Hypertensive chronic kidney disease with stage 1 through stage 4 chronic kidney disease, or unspecified chronic kidney disease: Secondary | ICD-10-CM | POA: Diagnosis not present

## 2016-10-13 DIAGNOSIS — E1129 Type 2 diabetes mellitus with other diabetic kidney complication: Secondary | ICD-10-CM | POA: Diagnosis not present

## 2016-10-13 DIAGNOSIS — Z6837 Body mass index (BMI) 37.0-37.9, adult: Secondary | ICD-10-CM | POA: Diagnosis not present

## 2016-10-13 DIAGNOSIS — N183 Chronic kidney disease, stage 3 (moderate): Secondary | ICD-10-CM | POA: Diagnosis not present

## 2016-10-13 DIAGNOSIS — N189 Chronic kidney disease, unspecified: Secondary | ICD-10-CM | POA: Diagnosis not present

## 2016-10-13 DIAGNOSIS — N2581 Secondary hyperparathyroidism of renal origin: Secondary | ICD-10-CM | POA: Diagnosis not present

## 2016-10-13 DIAGNOSIS — I129 Hypertensive chronic kidney disease with stage 1 through stage 4 chronic kidney disease, or unspecified chronic kidney disease: Secondary | ICD-10-CM | POA: Diagnosis not present

## 2016-10-13 DIAGNOSIS — D631 Anemia in chronic kidney disease: Secondary | ICD-10-CM | POA: Diagnosis not present

## 2016-10-14 DIAGNOSIS — Z7984 Long term (current) use of oral hypoglycemic drugs: Secondary | ICD-10-CM | POA: Diagnosis not present

## 2016-10-14 DIAGNOSIS — N184 Chronic kidney disease, stage 4 (severe): Secondary | ICD-10-CM | POA: Diagnosis not present

## 2016-10-14 DIAGNOSIS — E1142 Type 2 diabetes mellitus with diabetic polyneuropathy: Secondary | ICD-10-CM | POA: Diagnosis not present

## 2016-10-14 DIAGNOSIS — R609 Edema, unspecified: Secondary | ICD-10-CM | POA: Diagnosis not present

## 2016-10-14 DIAGNOSIS — E1122 Type 2 diabetes mellitus with diabetic chronic kidney disease: Secondary | ICD-10-CM | POA: Diagnosis not present

## 2016-10-14 DIAGNOSIS — E113299 Type 2 diabetes mellitus with mild nonproliferative diabetic retinopathy without macular edema, unspecified eye: Secondary | ICD-10-CM | POA: Diagnosis not present

## 2016-10-14 DIAGNOSIS — N183 Chronic kidney disease, stage 3 (moderate): Secondary | ICD-10-CM | POA: Diagnosis not present

## 2016-11-04 DIAGNOSIS — N183 Chronic kidney disease, stage 3 (moderate): Secondary | ICD-10-CM | POA: Diagnosis not present

## 2016-11-04 DIAGNOSIS — E119 Type 2 diabetes mellitus without complications: Secondary | ICD-10-CM | POA: Diagnosis not present

## 2016-11-07 DIAGNOSIS — H35372 Puckering of macula, left eye: Secondary | ICD-10-CM | POA: Diagnosis not present

## 2016-11-07 DIAGNOSIS — H402231 Chronic angle-closure glaucoma, bilateral, mild stage: Secondary | ICD-10-CM | POA: Diagnosis not present

## 2016-11-07 DIAGNOSIS — E113293 Type 2 diabetes mellitus with mild nonproliferative diabetic retinopathy without macular edema, bilateral: Secondary | ICD-10-CM | POA: Diagnosis not present

## 2016-11-07 DIAGNOSIS — H35033 Hypertensive retinopathy, bilateral: Secondary | ICD-10-CM | POA: Diagnosis not present

## 2016-11-18 DIAGNOSIS — E119 Type 2 diabetes mellitus without complications: Secondary | ICD-10-CM | POA: Diagnosis not present

## 2016-11-18 DIAGNOSIS — D649 Anemia, unspecified: Secondary | ICD-10-CM | POA: Diagnosis not present

## 2016-11-18 DIAGNOSIS — R0602 Shortness of breath: Secondary | ICD-10-CM | POA: Diagnosis not present

## 2016-11-18 DIAGNOSIS — N189 Chronic kidney disease, unspecified: Secondary | ICD-10-CM | POA: Diagnosis not present

## 2016-11-29 DIAGNOSIS — R0602 Shortness of breath: Secondary | ICD-10-CM | POA: Diagnosis not present

## 2016-11-29 DIAGNOSIS — Z0189 Encounter for other specified special examinations: Secondary | ICD-10-CM | POA: Diagnosis not present

## 2016-11-29 DIAGNOSIS — E782 Mixed hyperlipidemia: Secondary | ICD-10-CM | POA: Diagnosis not present

## 2016-11-29 DIAGNOSIS — I10 Essential (primary) hypertension: Secondary | ICD-10-CM | POA: Diagnosis not present

## 2016-12-05 DIAGNOSIS — I129 Hypertensive chronic kidney disease with stage 1 through stage 4 chronic kidney disease, or unspecified chronic kidney disease: Secondary | ICD-10-CM | POA: Diagnosis not present

## 2016-12-05 DIAGNOSIS — D631 Anemia in chronic kidney disease: Secondary | ICD-10-CM | POA: Diagnosis not present

## 2016-12-05 DIAGNOSIS — E1129 Type 2 diabetes mellitus with other diabetic kidney complication: Secondary | ICD-10-CM | POA: Diagnosis not present

## 2016-12-05 DIAGNOSIS — N2581 Secondary hyperparathyroidism of renal origin: Secondary | ICD-10-CM | POA: Diagnosis not present

## 2016-12-05 DIAGNOSIS — R0600 Dyspnea, unspecified: Secondary | ICD-10-CM | POA: Diagnosis not present

## 2016-12-05 DIAGNOSIS — Z6837 Body mass index (BMI) 37.0-37.9, adult: Secondary | ICD-10-CM | POA: Diagnosis not present

## 2016-12-05 DIAGNOSIS — N184 Chronic kidney disease, stage 4 (severe): Secondary | ICD-10-CM | POA: Diagnosis not present

## 2016-12-09 DIAGNOSIS — I10 Essential (primary) hypertension: Secondary | ICD-10-CM | POA: Diagnosis not present

## 2016-12-09 DIAGNOSIS — R0602 Shortness of breath: Secondary | ICD-10-CM | POA: Diagnosis not present

## 2016-12-09 DIAGNOSIS — E119 Type 2 diabetes mellitus without complications: Secondary | ICD-10-CM | POA: Diagnosis not present

## 2016-12-14 ENCOUNTER — Ambulatory Visit
Admission: RE | Admit: 2016-12-14 | Discharge: 2016-12-14 | Disposition: A | Discharge status: Home / Self Care | Payer: PPO | Source: Ambulatory Visit | Attending: Cardiology | Admitting: Cardiology

## 2016-12-14 ENCOUNTER — Other Ambulatory Visit: Payer: Self-pay | Admitting: Cardiology

## 2016-12-14 DIAGNOSIS — R0602 Shortness of breath: Secondary | ICD-10-CM | POA: Diagnosis not present

## 2016-12-14 DIAGNOSIS — I10 Essential (primary) hypertension: Secondary | ICD-10-CM | POA: Diagnosis not present

## 2016-12-20 DIAGNOSIS — F329 Major depressive disorder, single episode, unspecified: Secondary | ICD-10-CM | POA: Diagnosis not present

## 2016-12-20 DIAGNOSIS — E1121 Type 2 diabetes mellitus with diabetic nephropathy: Secondary | ICD-10-CM | POA: Diagnosis not present

## 2016-12-20 DIAGNOSIS — I10 Essential (primary) hypertension: Secondary | ICD-10-CM | POA: Diagnosis not present

## 2016-12-20 DIAGNOSIS — Z6835 Body mass index (BMI) 35.0-35.9, adult: Secondary | ICD-10-CM | POA: Diagnosis not present

## 2016-12-20 DIAGNOSIS — R5382 Chronic fatigue, unspecified: Secondary | ICD-10-CM | POA: Diagnosis not present

## 2016-12-20 DIAGNOSIS — N183 Chronic kidney disease, stage 3 (moderate): Secondary | ICD-10-CM | POA: Diagnosis not present

## 2016-12-20 DIAGNOSIS — E6609 Other obesity due to excess calories: Secondary | ICD-10-CM | POA: Diagnosis not present

## 2016-12-20 DIAGNOSIS — Z7984 Long term (current) use of oral hypoglycemic drugs: Secondary | ICD-10-CM | POA: Diagnosis not present

## 2016-12-27 DIAGNOSIS — I10 Essential (primary) hypertension: Secondary | ICD-10-CM | POA: Diagnosis not present

## 2016-12-27 DIAGNOSIS — R0602 Shortness of breath: Secondary | ICD-10-CM | POA: Diagnosis not present

## 2016-12-27 DIAGNOSIS — E782 Mixed hyperlipidemia: Secondary | ICD-10-CM | POA: Diagnosis not present

## 2016-12-27 DIAGNOSIS — Z0189 Encounter for other specified special examinations: Secondary | ICD-10-CM | POA: Diagnosis not present

## 2017-01-03 DIAGNOSIS — E1142 Type 2 diabetes mellitus with diabetic polyneuropathy: Secondary | ICD-10-CM | POA: Diagnosis not present

## 2017-01-03 DIAGNOSIS — R609 Edema, unspecified: Secondary | ICD-10-CM | POA: Diagnosis not present

## 2017-01-03 DIAGNOSIS — E1122 Type 2 diabetes mellitus with diabetic chronic kidney disease: Secondary | ICD-10-CM | POA: Diagnosis not present

## 2017-01-03 DIAGNOSIS — R531 Weakness: Secondary | ICD-10-CM | POA: Diagnosis not present

## 2017-01-03 DIAGNOSIS — R5383 Other fatigue: Secondary | ICD-10-CM | POA: Diagnosis not present

## 2017-01-03 DIAGNOSIS — N184 Chronic kidney disease, stage 4 (severe): Secondary | ICD-10-CM | POA: Diagnosis not present

## 2017-01-03 DIAGNOSIS — E113299 Type 2 diabetes mellitus with mild nonproliferative diabetic retinopathy without macular edema, unspecified eye: Secondary | ICD-10-CM | POA: Diagnosis not present

## 2017-01-03 DIAGNOSIS — Z5181 Encounter for therapeutic drug level monitoring: Secondary | ICD-10-CM | POA: Diagnosis not present

## 2017-01-03 DIAGNOSIS — Z92241 Personal history of systemic steroid therapy: Secondary | ICD-10-CM | POA: Diagnosis not present

## 2017-01-10 DIAGNOSIS — R5383 Other fatigue: Secondary | ICD-10-CM | POA: Diagnosis not present

## 2017-01-10 DIAGNOSIS — N184 Chronic kidney disease, stage 4 (severe): Secondary | ICD-10-CM | POA: Diagnosis not present

## 2017-01-10 DIAGNOSIS — E1122 Type 2 diabetes mellitus with diabetic chronic kidney disease: Secondary | ICD-10-CM | POA: Diagnosis not present

## 2017-01-10 DIAGNOSIS — Z5181 Encounter for therapeutic drug level monitoring: Secondary | ICD-10-CM | POA: Diagnosis not present

## 2017-01-10 DIAGNOSIS — R609 Edema, unspecified: Secondary | ICD-10-CM | POA: Diagnosis not present

## 2017-01-10 DIAGNOSIS — Z92241 Personal history of systemic steroid therapy: Secondary | ICD-10-CM | POA: Diagnosis not present

## 2017-01-10 DIAGNOSIS — R531 Weakness: Secondary | ICD-10-CM | POA: Diagnosis not present

## 2017-01-10 DIAGNOSIS — E781 Pure hyperglyceridemia: Secondary | ICD-10-CM | POA: Diagnosis not present

## 2017-01-10 DIAGNOSIS — Z7984 Long term (current) use of oral hypoglycemic drugs: Secondary | ICD-10-CM | POA: Diagnosis not present

## 2017-01-10 DIAGNOSIS — E1142 Type 2 diabetes mellitus with diabetic polyneuropathy: Secondary | ICD-10-CM | POA: Diagnosis not present

## 2017-01-31 DIAGNOSIS — D631 Anemia in chronic kidney disease: Secondary | ICD-10-CM | POA: Diagnosis not present

## 2017-01-31 DIAGNOSIS — Z6837 Body mass index (BMI) 37.0-37.9, adult: Secondary | ICD-10-CM | POA: Diagnosis not present

## 2017-01-31 DIAGNOSIS — N2581 Secondary hyperparathyroidism of renal origin: Secondary | ICD-10-CM | POA: Diagnosis not present

## 2017-01-31 DIAGNOSIS — I129 Hypertensive chronic kidney disease with stage 1 through stage 4 chronic kidney disease, or unspecified chronic kidney disease: Secondary | ICD-10-CM | POA: Diagnosis not present

## 2017-01-31 DIAGNOSIS — N183 Chronic kidney disease, stage 3 (moderate): Secondary | ICD-10-CM | POA: Diagnosis not present

## 2017-01-31 DIAGNOSIS — E1129 Type 2 diabetes mellitus with other diabetic kidney complication: Secondary | ICD-10-CM | POA: Diagnosis not present

## 2017-02-13 ENCOUNTER — Ambulatory Visit
Admission: RE | Admit: 2017-02-13 | Discharge: 2017-02-13 | Disposition: A | Discharge status: Home / Self Care | Payer: PPO | Source: Ambulatory Visit | Attending: Adult Health | Admitting: Adult Health

## 2017-02-13 DIAGNOSIS — Z170 Estrogen receptor positive status [ER+]: Principal | ICD-10-CM

## 2017-02-13 DIAGNOSIS — R928 Other abnormal and inconclusive findings on diagnostic imaging of breast: Secondary | ICD-10-CM | POA: Diagnosis not present

## 2017-02-13 DIAGNOSIS — C50412 Malignant neoplasm of upper-outer quadrant of left female breast: Secondary | ICD-10-CM

## 2017-02-13 HISTORY — DX: Personal history of irradiation: Z92.3

## 2017-03-01 DIAGNOSIS — E1121 Type 2 diabetes mellitus with diabetic nephropathy: Secondary | ICD-10-CM | POA: Diagnosis not present

## 2017-03-01 DIAGNOSIS — L989 Disorder of the skin and subcutaneous tissue, unspecified: Secondary | ICD-10-CM | POA: Diagnosis not present

## 2017-03-01 DIAGNOSIS — N189 Chronic kidney disease, unspecified: Secondary | ICD-10-CM | POA: Diagnosis not present

## 2017-03-01 DIAGNOSIS — Z23 Encounter for immunization: Secondary | ICD-10-CM | POA: Diagnosis not present

## 2017-03-01 DIAGNOSIS — R0602 Shortness of breath: Secondary | ICD-10-CM | POA: Diagnosis not present

## 2017-03-01 DIAGNOSIS — R5382 Chronic fatigue, unspecified: Secondary | ICD-10-CM | POA: Diagnosis not present

## 2017-03-01 DIAGNOSIS — I10 Essential (primary) hypertension: Secondary | ICD-10-CM | POA: Diagnosis not present

## 2017-03-14 DIAGNOSIS — N184 Chronic kidney disease, stage 4 (severe): Secondary | ICD-10-CM | POA: Diagnosis not present

## 2017-03-14 DIAGNOSIS — R0602 Shortness of breath: Secondary | ICD-10-CM | POA: Diagnosis not present

## 2017-03-14 DIAGNOSIS — Z5181 Encounter for therapeutic drug level monitoring: Secondary | ICD-10-CM | POA: Diagnosis not present

## 2017-03-14 DIAGNOSIS — E1142 Type 2 diabetes mellitus with diabetic polyneuropathy: Secondary | ICD-10-CM | POA: Diagnosis not present

## 2017-03-14 DIAGNOSIS — Z92241 Personal history of systemic steroid therapy: Secondary | ICD-10-CM | POA: Diagnosis not present

## 2017-03-14 DIAGNOSIS — E113299 Type 2 diabetes mellitus with mild nonproliferative diabetic retinopathy without macular edema, unspecified eye: Secondary | ICD-10-CM | POA: Diagnosis not present

## 2017-03-14 DIAGNOSIS — E1122 Type 2 diabetes mellitus with diabetic chronic kidney disease: Secondary | ICD-10-CM | POA: Diagnosis not present

## 2017-03-20 DIAGNOSIS — R0602 Shortness of breath: Secondary | ICD-10-CM | POA: Diagnosis not present

## 2017-04-04 DIAGNOSIS — D229 Melanocytic nevi, unspecified: Secondary | ICD-10-CM | POA: Diagnosis not present

## 2017-04-04 DIAGNOSIS — L570 Actinic keratosis: Secondary | ICD-10-CM | POA: Diagnosis not present

## 2017-04-04 DIAGNOSIS — L821 Other seborrheic keratosis: Secondary | ICD-10-CM | POA: Diagnosis not present

## 2017-04-24 DIAGNOSIS — H1013 Acute atopic conjunctivitis, bilateral: Secondary | ICD-10-CM | POA: Diagnosis not present

## 2017-04-24 DIAGNOSIS — H402231 Chronic angle-closure glaucoma, bilateral, mild stage: Secondary | ICD-10-CM | POA: Diagnosis not present

## 2017-04-24 DIAGNOSIS — H02109 Unspecified ectropion of unspecified eye, unspecified eyelid: Secondary | ICD-10-CM | POA: Diagnosis not present

## 2017-05-01 DIAGNOSIS — K5641 Fecal impaction: Secondary | ICD-10-CM | POA: Diagnosis not present

## 2017-05-01 DIAGNOSIS — K645 Perianal venous thrombosis: Secondary | ICD-10-CM | POA: Diagnosis not present

## 2017-05-02 ENCOUNTER — Other Ambulatory Visit: Payer: Self-pay | Admitting: Oncology
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
## 2017-05-22 ENCOUNTER — Ambulatory Visit: Payer: PPO | Admitting: Oncology

## 2017-05-22 VITALS — BP 133/80 | HR 77 | Temp 98.7°F | Resp 18 | Ht 64.5 in | Wt 210.9 lb

## 2017-05-22 DIAGNOSIS — Z170 Estrogen receptor positive status [ER+]: Secondary | ICD-10-CM | POA: Diagnosis not present

## 2017-05-22 DIAGNOSIS — D0512 Intraductal carcinoma in situ of left breast: Secondary | ICD-10-CM

## 2017-05-22 DIAGNOSIS — Z79810 Long term (current) use of selective estrogen receptor modulators (SERMs): Secondary | ICD-10-CM

## 2017-05-22 DIAGNOSIS — C50412 Malignant neoplasm of upper-outer quadrant of left female breast: Secondary | ICD-10-CM

## 2017-06-05 ENCOUNTER — Ambulatory Visit: Payer: PPO | Admitting: Physical Therapy

## 2017-06-05 DIAGNOSIS — K5641 Fecal impaction: Secondary | ICD-10-CM | POA: Diagnosis not present

## 2017-08-08 DIAGNOSIS — N189 Chronic kidney disease, unspecified: Secondary | ICD-10-CM | POA: Diagnosis not present

## 2017-08-08 DIAGNOSIS — Z6837 Body mass index (BMI) 37.0-37.9, adult: Secondary | ICD-10-CM | POA: Diagnosis not present

## 2017-08-08 DIAGNOSIS — L299 Pruritus, unspecified: Secondary | ICD-10-CM | POA: Diagnosis not present

## 2017-08-08 DIAGNOSIS — E1122 Type 2 diabetes mellitus with diabetic chronic kidney disease: Secondary | ICD-10-CM | POA: Diagnosis not present

## 2017-08-08 DIAGNOSIS — N2581 Secondary hyperparathyroidism of renal origin: Secondary | ICD-10-CM | POA: Diagnosis not present

## 2017-08-08 DIAGNOSIS — R609 Edema, unspecified: Secondary | ICD-10-CM | POA: Diagnosis not present

## 2017-08-08 DIAGNOSIS — D631 Anemia in chronic kidney disease: Secondary | ICD-10-CM | POA: Diagnosis not present

## 2017-08-08 DIAGNOSIS — N183 Chronic kidney disease, stage 3 (moderate): Secondary | ICD-10-CM | POA: Diagnosis not present

## 2017-08-08 DIAGNOSIS — E1129 Type 2 diabetes mellitus with other diabetic kidney complication: Secondary | ICD-10-CM | POA: Diagnosis not present

## 2017-08-08 DIAGNOSIS — I129 Hypertensive chronic kidney disease with stage 1 through stage 4 chronic kidney disease, or unspecified chronic kidney disease: Secondary | ICD-10-CM | POA: Diagnosis not present

## 2017-08-11 DIAGNOSIS — H1013 Acute atopic conjunctivitis, bilateral: Secondary | ICD-10-CM | POA: Diagnosis not present

## 2017-08-11 DIAGNOSIS — H01009 Unspecified blepharitis unspecified eye, unspecified eyelid: Secondary | ICD-10-CM | POA: Diagnosis not present

## 2017-08-16 DIAGNOSIS — Z6837 Body mass index (BMI) 37.0-37.9, adult: Secondary | ICD-10-CM | POA: Diagnosis not present

## 2017-08-16 DIAGNOSIS — E1122 Type 2 diabetes mellitus with diabetic chronic kidney disease: Secondary | ICD-10-CM | POA: Diagnosis not present

## 2017-08-16 DIAGNOSIS — I129 Hypertensive chronic kidney disease with stage 1 through stage 4 chronic kidney disease, or unspecified chronic kidney disease: Secondary | ICD-10-CM | POA: Diagnosis not present

## 2017-08-16 DIAGNOSIS — N183 Chronic kidney disease, stage 3 (moderate): Secondary | ICD-10-CM | POA: Diagnosis not present

## 2017-08-16 DIAGNOSIS — N2581 Secondary hyperparathyroidism of renal origin: Secondary | ICD-10-CM | POA: Diagnosis not present

## 2017-08-16 DIAGNOSIS — D631 Anemia in chronic kidney disease: Secondary | ICD-10-CM | POA: Diagnosis not present

## 2017-08-16 DIAGNOSIS — R609 Edema, unspecified: Secondary | ICD-10-CM | POA: Diagnosis not present

## 2017-09-01 DIAGNOSIS — Z86010 Personal history of colonic polyps: Secondary | ICD-10-CM | POA: Diagnosis not present

## 2017-09-04 DIAGNOSIS — J00 Acute nasopharyngitis [common cold]: Secondary | ICD-10-CM | POA: Diagnosis not present

## 2017-09-04 DIAGNOSIS — H60333 Swimmer's ear, bilateral: Secondary | ICD-10-CM | POA: Diagnosis not present

## 2017-10-06 ENCOUNTER — Ambulatory Visit (HOSPITAL_COMMUNITY)
Admission: EM | Admit: 2017-10-06 | Discharge: 2017-10-06 | Disposition: A | Discharge status: Home / Self Care | Payer: PPO | Attending: Family Medicine | Admitting: Family Medicine

## 2017-10-06 ENCOUNTER — Other Ambulatory Visit: Payer: Self-pay

## 2017-10-06 ENCOUNTER — Ambulatory Visit (INDEPENDENT_AMBULATORY_CARE_PROVIDER_SITE_OTHER): Payer: PPO

## 2017-10-06 ENCOUNTER — Encounter (HOSPITAL_COMMUNITY): Payer: Self-pay

## 2017-10-06 DIAGNOSIS — S9032XA Contusion of left foot, initial encounter: Secondary | ICD-10-CM | POA: Diagnosis not present

## 2017-10-06 DIAGNOSIS — M79672 Pain in left foot: Secondary | ICD-10-CM

## 2017-10-06 DIAGNOSIS — S40022A Contusion of left upper arm, initial encounter: Secondary | ICD-10-CM

## 2017-10-06 DIAGNOSIS — W19XXXA Unspecified fall, initial encounter: Secondary | ICD-10-CM

## 2017-10-06 DIAGNOSIS — S99922A Unspecified injury of left foot, initial encounter: Secondary | ICD-10-CM | POA: Diagnosis not present

## 2017-10-06 DIAGNOSIS — S5012XA Contusion of left forearm, initial encounter: Secondary | ICD-10-CM

## 2017-10-06 NOTE — Discharge Instructions (Addendum)
Boot given.   Rest, ice, and elevate OTC tylenol for pain  Follow up with PCP next week if symptoms persist Return or go to ER if you have any new or worsening symptoms

## 2017-10-06 NOTE — ED Triage Notes (Signed)
Pt presents with complaints of falling yesterday, states that she tripped over the coffee table leg and fell forward. Complaints of pain in her left arm and left foot.  Reports feeling sore all over

## 2017-10-06 NOTE — ED Provider Notes (Signed)
[facility]    CSN: [id number] Arrival date & time: 10/06/17  1008     History   Chief Complaint Chief Complaint  Patient presents with  . Fall    HPI Brittney Wright is a 83 y.o. female that presents with left foot pain.  It began after she hit her left foot on the leg of the coffee table falling forward into another wooden piece of furniture yesterday.  She localizes her pain to top of her left foot.  She has not tried any over the counter medications.  Her symptoms are made worse with walking on her foot. She complains of increased swelling.    Patient also complains of right upper arm bruising.  She denies pain.    HPI  Past Medical History:  Diagnosis Date  . Back pain   . 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 Cataract   . Depression   . Diabetes mellitus   . History of radiation therapy 03/28/12- 04/26/12   left breast 4250 cGy 17 sessions, left breast boost 750 cGy 3 sessions  . Hot flashes   . Hypertension   . Personal history of radiation therapy   . SOB (shortness of breath) on exertion     Patient Active Problem List   Diagnosis Date Noted  . Breast cancer of upper-outer quadrant of left female breast ([place]) 01/13/2012    Past Surgical History:  Procedure Laterality Date  . BREAST BIOPSY    . BREAST LUMPECTOMY     left 2013  . BREAST SURGERY  1976   rt br bx  . CATARACT EXTRACTION Bilateral    08/2011 and 05/2011  . COLONOSCOPY    . DILATION AND CURETTAGE OF UTERUS    . EYE SURGERY  12,13   cataracts-both    OB History   None      Home Medications    Prior to Admission medications   Medication Sig Start Date End Date Taking? Authorizing Provider  ACCU-CHEK AVIVA PLUS test strip 1 each by Other route as needed.  02/02/12  Yes [provider]  atorvastatin (LIPITOR) 80 MG tablet Take 80 mg by mouth daily.   Yes [provider]  BABY ASPIRIN PO Take 81 mg by mouth daily. 01/07/14  Yes [provider]  Betamethasone Valerate 0.12 % foam Apply 1 application topically as needed.  03/23/12  Yes [provider]  Cholecalciferol (VITAMIN D-400 PO) Take 1 tablet by mouth daily. 01/07/14  Yes [provider]  fenofibrate 160 MG tablet Take 160 mg by mouth daily.   Yes [provider]  glipiZIDE (GLUCOTROL) 10 MG tablet Take 10 mg by mouth daily.   Yes [provider]  Lancets (ACCU-CHEK MULTICLIX) lancets 1 each by Other route as needed.  02/02/12  Yes [provider]  latanoprost (XALATAN) 0.005 % ophthalmic solution Place 1 drop into both eyes at bedtime.  01/08/13  Yes [provider]  lisinopril-hydrochlorothiazide (PRINZIDE,ZESTORETIC) 20-12.5 MG per tablet Take 1 tablet by mouth daily.   Yes [provider]  pioglitazone (ACTOS) 30 MG tablet Take 30 mg by mouth daily.   Yes [provider]  tamoxifen (NOLVADEX) 20 MG tablet Take 1 tablet by mouth once daily 05/02/17  Yes Magrinat, Virgie Dad, MD  torsemide (DEMADEX) 10 MG tablet Take 10 mg by mouth daily.   Yes [provider]  DULoxetine (CYMBALTA) 20 MG capsule Reported on 08/25/2015 01/24/14  [provider]    Family History Family History  Problem Relation Age of Onset  . Heart attack Mother   . Stroke Mother   . Heart attack Father   . Hypertension Sister   . Heart Problems Paternal Grandfather   . Breast cancer Paternal Aunt     Social History Social History   Tobacco Use  . Smoking status: Former Smoker    Last attempt to quit: 06/21/1991    Years since quitting: 26.3  . Smokeless tobacco: Never Used  Substance Use Topics  . Alcohol use: Yes    Comment: Rarely  . Drug use: No     Allergies   Minocycline   Review of Systems Review of Systems  Constitutional: Negative for chills and fever.  Respiratory: Negative for shortness of breath.   Cardiovascular: Negative for chest pain.  Gastrointestinal: Negative for nausea  and vomiting.  Musculoskeletal: Positive for arthralgias, joint swelling and myalgias.     Physical Exam Triage Vital Signs ED Triage Vitals  Enc Vitals Group     BP 10/06/17 1056 (!) 156/83     Pulse Rate 10/06/17 1056 84     Resp --      Temp 10/06/17 1056 98.6 F (37 C)     Temp src --      SpO2 10/06/17 1056 99 %     Weight 10/06/17 1057 215 lb (97.5 kg)     Height --      Head Circumference --      Peak Flow --      Pain Score 10/06/17 1057 8     Pain Loc --      Pain Edu? --      Excl. in Ord? --    No data found.  Updated Vital Signs BP (!) 156/83   Pulse 84   Temp 98.6 F (37 C)   Wt 215 lb (97.5 kg)   SpO2 99%   BMI 36.33 kg/m   Physical Exam  Constitutional: She is oriented to person, place, and time. She appears well-developed and well-nourished. No distress.  HENT:  Head: Normocephalic and atraumatic.  Eyes: Conjunctivae and EOM are normal.  Neck: Normal range of motion.  Cardiovascular: Normal rate, regular rhythm and normal heart sounds. Exam reveals no friction rub.  No murmur heard. Pulmonary/Chest: Effort normal and breath sounds normal. She has no wheezes. She has no rales.  Musculoskeletal: She exhibits edema and tenderness.  Mild ecchymosis of the left medial distal humerus.  Nontender about the UE including shld, elbows, wrist, and hands.  FROM intact bilaterally UE.  Strength intact bilaterally about the UE  Lower extremity 2+ pitting edema bilaterally.    Left foot Inspection: Skin clear and intact. Mild erythema lateral aspect of fifth MTP.  Skin warm and dry to the touch Palpation: Tender to palpation about the anterior aspect of the 4th and 5th metatarsals ROM: LROM Strength:  5/5 dorsiflexion, 5/5 plantar flexion   Right foot Inspection: Skin clear and intact. Skin warm and dry to the touch Palpation: Nontender to palpation ROM: FROM active and passive Strength:  5/5 dorsiflexion, 5/5 plantar flexion  Neurological: She is alert  and oriented to person, place, and time.  Skin: Skin is warm and dry.  Psychiatric: She has a normal mood and affect. Her behavior is normal. Judgment and thought content normal.     UC Treatments / Results  Labs (all labs ordered are listed, but only abnormal results are displayed) Labs  Reviewed - No data to display  EKG None Radiology Dg Foot Complete Left  Result Date: 10/06/2017 CLINICAL DATA:  Third through fifth metatarsal pain after fall yesterday. EXAM: LEFT FOOT - COMPLETE 3+ VIEW COMPARISON:  Left foot x-rays dated Nov 04, 2015. FINDINGS: No acute fracture or dislocation. Mild osteoarthritis of the first MTP joint, similar to prior study. Remaining joint spaces are relatively preserved. Osteopenia. Large plantar and Achilles enthesophytes. Dorsal forefoot soft tissue swelling. IMPRESSION: 1.  No acute osseous abnormality. Electronically Signed   By: Titus Dubin M.D.   On: 10/06/2017 12:46   Negative left foot xray for fracture  Procedures Procedures (including critical care time)  Medications Ordered in UC Medications - No data to display   Initial Impression / Assessment and Plan / UC Course  I have reviewed the triage vital signs and the nursing notes.  Pertinent labs & imaging results that were available during my care of the patient were reviewed by me and considered in my medical decision making (see chart for details).    Patient present one day after fall with left foot pain.  X-rays were negative for fracture.  Patient placed in boot.  Crutches offered, declined at this time.  Recommend ice, rest, and elevation.  Follow up with PCP next week if symptoms persist.  Return here or go to ER if you have any new or worsening symptoms.      Final Clinical Impressions(s) / UC Diagnoses   Final diagnoses:  Fall, initial encounter  Left foot pain  Arm bruise, left, initial encounter    ED Discharge Orders    None       Controlled Substance  Prescriptions [place] Controlled Substance Registry consulted? No   Lestine Box, [place] 10/06/17 1304

## 2017-10-27 DIAGNOSIS — E11319 Type 2 diabetes mellitus with unspecified diabetic retinopathy without macular edema: Secondary | ICD-10-CM | POA: Diagnosis not present

## 2017-10-27 DIAGNOSIS — N184 Chronic kidney disease, stage 4 (severe): Secondary | ICD-10-CM | POA: Diagnosis not present

## 2017-10-27 DIAGNOSIS — E1142 Type 2 diabetes mellitus with diabetic polyneuropathy: Secondary | ICD-10-CM | POA: Diagnosis not present

## 2017-10-27 DIAGNOSIS — Z5181 Encounter for therapeutic drug level monitoring: Secondary | ICD-10-CM | POA: Diagnosis not present

## 2017-10-27 DIAGNOSIS — E1122 Type 2 diabetes mellitus with diabetic chronic kidney disease: Secondary | ICD-10-CM | POA: Diagnosis not present

## 2017-11-01 DIAGNOSIS — H35372 Puckering of macula, left eye: Secondary | ICD-10-CM | POA: Diagnosis not present

## 2017-11-01 DIAGNOSIS — E113293 Type 2 diabetes mellitus with mild nonproliferative diabetic retinopathy without macular edema, bilateral: Secondary | ICD-10-CM | POA: Diagnosis not present

## 2017-11-01 DIAGNOSIS — H35033 Hypertensive retinopathy, bilateral: Secondary | ICD-10-CM | POA: Diagnosis not present

## 2017-11-01 DIAGNOSIS — H402231 Chronic angle-closure glaucoma, bilateral, mild stage: Secondary | ICD-10-CM | POA: Diagnosis not present

## 2017-11-21 DIAGNOSIS — R609 Edema, unspecified: Secondary | ICD-10-CM | POA: Diagnosis not present

## 2017-11-21 DIAGNOSIS — Z853 Personal history of malignant neoplasm of breast: Secondary | ICD-10-CM | POA: Diagnosis not present

## 2017-11-21 DIAGNOSIS — E1129 Type 2 diabetes mellitus with other diabetic kidney complication: Secondary | ICD-10-CM | POA: Diagnosis not present

## 2017-11-21 DIAGNOSIS — N2581 Secondary hyperparathyroidism of renal origin: Secondary | ICD-10-CM | POA: Diagnosis not present

## 2017-11-21 DIAGNOSIS — D631 Anemia in chronic kidney disease: Secondary | ICD-10-CM | POA: Diagnosis not present

## 2017-11-21 DIAGNOSIS — N183 Chronic kidney disease, stage 3 (moderate): Secondary | ICD-10-CM | POA: Diagnosis not present

## 2017-11-21 DIAGNOSIS — E1122 Type 2 diabetes mellitus with diabetic chronic kidney disease: Secondary | ICD-10-CM | POA: Diagnosis not present

## 2017-11-27 DIAGNOSIS — H60333 Swimmer's ear, bilateral: Secondary | ICD-10-CM | POA: Diagnosis not present

## 2018-01-29 ENCOUNTER — Other Ambulatory Visit: Payer: Self-pay | Admitting: Oncology

## 2018-01-29 DIAGNOSIS — Z1231 Encounter for screening mammogram for malignant neoplasm of breast: Secondary | ICD-10-CM

## 2018-02-22 ENCOUNTER — Ambulatory Visit
Admission: RE | Admit: 2018-02-22 | Discharge: 2018-02-22 | Disposition: A | Discharge status: Home / Self Care | Payer: PPO | Source: Ambulatory Visit | Attending: Oncology | Admitting: Oncology

## 2018-02-22 DIAGNOSIS — Z1231 Encounter for screening mammogram for malignant neoplasm of breast: Secondary | ICD-10-CM | POA: Diagnosis not present

## 2018-03-26 DIAGNOSIS — R609 Edema, unspecified: Secondary | ICD-10-CM | POA: Diagnosis not present

## 2018-03-26 DIAGNOSIS — Z853 Personal history of malignant neoplasm of breast: Secondary | ICD-10-CM | POA: Diagnosis not present

## 2018-03-26 DIAGNOSIS — N2581 Secondary hyperparathyroidism of renal origin: Secondary | ICD-10-CM | POA: Diagnosis not present

## 2018-03-26 DIAGNOSIS — N183 Chronic kidney disease, stage 3 (moderate): Secondary | ICD-10-CM | POA: Diagnosis not present

## 2018-03-26 DIAGNOSIS — E1129 Type 2 diabetes mellitus with other diabetic kidney complication: Secondary | ICD-10-CM | POA: Diagnosis not present

## 2018-03-26 DIAGNOSIS — E1122 Type 2 diabetes mellitus with diabetic chronic kidney disease: Secondary | ICD-10-CM | POA: Diagnosis not present

## 2018-03-26 DIAGNOSIS — D631 Anemia in chronic kidney disease: Secondary | ICD-10-CM | POA: Diagnosis not present

## 2018-04-05 DIAGNOSIS — E11319 Type 2 diabetes mellitus with unspecified diabetic retinopathy without macular edema: Secondary | ICD-10-CM | POA: Diagnosis not present

## 2018-04-05 DIAGNOSIS — E1142 Type 2 diabetes mellitus with diabetic polyneuropathy: Secondary | ICD-10-CM | POA: Diagnosis not present

## 2018-04-05 DIAGNOSIS — N184 Chronic kidney disease, stage 4 (severe): Secondary | ICD-10-CM | POA: Diagnosis not present

## 2018-04-05 DIAGNOSIS — Z5181 Encounter for therapeutic drug level monitoring: Secondary | ICD-10-CM | POA: Diagnosis not present

## 2018-04-05 DIAGNOSIS — Z23 Encounter for immunization: Secondary | ICD-10-CM | POA: Diagnosis not present

## 2018-04-05 DIAGNOSIS — E1122 Type 2 diabetes mellitus with diabetic chronic kidney disease: Secondary | ICD-10-CM | POA: Diagnosis not present

## 2018-04-09 DIAGNOSIS — H1013 Acute atopic conjunctivitis, bilateral: Secondary | ICD-10-CM | POA: Diagnosis not present

## 2018-04-09 DIAGNOSIS — H402231 Chronic angle-closure glaucoma, bilateral, mild stage: Secondary | ICD-10-CM | POA: Diagnosis not present

## 2018-04-09 DIAGNOSIS — H01009 Unspecified blepharitis unspecified eye, unspecified eyelid: Secondary | ICD-10-CM | POA: Diagnosis not present

## 2018-04-09 DIAGNOSIS — H02839 Dermatochalasis of unspecified eye, unspecified eyelid: Secondary | ICD-10-CM | POA: Diagnosis not present

## 2018-08-02 DIAGNOSIS — N2581 Secondary hyperparathyroidism of renal origin: Secondary | ICD-10-CM | POA: Diagnosis not present

## 2018-08-02 DIAGNOSIS — E1129 Type 2 diabetes mellitus with other diabetic kidney complication: Secondary | ICD-10-CM | POA: Diagnosis not present

## 2018-08-02 DIAGNOSIS — N183 Chronic kidney disease, stage 3 (moderate): Secondary | ICD-10-CM | POA: Diagnosis not present

## 2018-08-02 DIAGNOSIS — E1122 Type 2 diabetes mellitus with diabetic chronic kidney disease: Secondary | ICD-10-CM | POA: Diagnosis not present

## 2018-08-02 DIAGNOSIS — Z853 Personal history of malignant neoplasm of breast: Secondary | ICD-10-CM | POA: Diagnosis not present

## 2018-08-02 DIAGNOSIS — R609 Edema, unspecified: Secondary | ICD-10-CM | POA: Diagnosis not present

## 2018-08-02 DIAGNOSIS — D631 Anemia in chronic kidney disease: Secondary | ICD-10-CM | POA: Diagnosis not present

## 2018-10-08 DIAGNOSIS — Z5181 Encounter for therapeutic drug level monitoring: Secondary | ICD-10-CM | POA: Diagnosis not present

## 2018-10-08 DIAGNOSIS — E1122 Type 2 diabetes mellitus with diabetic chronic kidney disease: Secondary | ICD-10-CM | POA: Diagnosis not present

## 2018-10-08 DIAGNOSIS — Z7984 Long term (current) use of oral hypoglycemic drugs: Secondary | ICD-10-CM | POA: Diagnosis not present

## 2018-10-08 DIAGNOSIS — E1142 Type 2 diabetes mellitus with diabetic polyneuropathy: Secondary | ICD-10-CM | POA: Diagnosis not present

## 2018-10-08 DIAGNOSIS — N184 Chronic kidney disease, stage 4 (severe): Secondary | ICD-10-CM | POA: Diagnosis not present

## 2018-10-08 DIAGNOSIS — R0689 Other abnormalities of breathing: Secondary | ICD-10-CM | POA: Diagnosis not present

## 2019-01-10 DIAGNOSIS — E1142 Type 2 diabetes mellitus with diabetic polyneuropathy: Secondary | ICD-10-CM | POA: Diagnosis not present

## 2019-01-10 DIAGNOSIS — E1122 Type 2 diabetes mellitus with diabetic chronic kidney disease: Secondary | ICD-10-CM | POA: Diagnosis not present

## 2019-01-10 DIAGNOSIS — E11319 Type 2 diabetes mellitus with unspecified diabetic retinopathy without macular edema: Secondary | ICD-10-CM | POA: Diagnosis not present

## 2019-01-10 DIAGNOSIS — N184 Chronic kidney disease, stage 4 (severe): Secondary | ICD-10-CM | POA: Diagnosis not present

## 2019-01-10 DIAGNOSIS — Z5181 Encounter for therapeutic drug level monitoring: Secondary | ICD-10-CM | POA: Diagnosis not present

## 2019-02-11 DIAGNOSIS — E113299 Type 2 diabetes mellitus with mild nonproliferative diabetic retinopathy without macular edema, unspecified eye: Secondary | ICD-10-CM | POA: Diagnosis not present

## 2019-02-11 DIAGNOSIS — N184 Chronic kidney disease, stage 4 (severe): Secondary | ICD-10-CM | POA: Diagnosis not present

## 2019-02-11 DIAGNOSIS — F5101 Primary insomnia: Secondary | ICD-10-CM | POA: Diagnosis not present

## 2019-03-18 ENCOUNTER — Telehealth: Payer: Self-pay | Admitting: *Deleted

## 2019-03-18 NOTE — Telephone Encounter (Signed)
This RN spoke with pt per her call stating concerns due to noted left breast changes she noticed this weekend.  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  She denies any known injury,  " I am scared that this is that inflammatory breast cancer that is really fast ".  This RN discussed above with pt stating she is not sure how when the left breast developed changes " because I just don't look at myself - but I just noticed it this weekend "  Pt states she would prefer not to go her her primary MD " just do not want to ".  This RN offered for pt to come in to this office today with pt stating she does not drive and would need to arrange transportation.  She states she has an arrangement with her daughter and is hoping to come in am on 10/5 " if I can be seen quickly "  She stated she understands that may be difficult.  This RN will review above with MD and return call to pt.  Of note pt has not had mammogram this year due to Covid.  Return call number given as [phone].

## 2019-03-20 ENCOUNTER — Telehealth: Payer: Self-pay | Admitting: *Deleted

## 2019-03-20 NOTE — Telephone Encounter (Signed)
Received call from pt stating she called the office on Monday and spoke with the RN regarding symptoms she was experiencing including left breast bruising, redness, and hardness.  Pt denies any recent trauma to the breast.  Pt states that since Monday the bruising and redness have lightened up but is still experiencing a hardness around her nipple.  Per Dr. Jana Hakim pt needs to be seen in the clinic.   Pt states the soonest she would be able to come is next week 03/25/2019 at 8:30.  Apt made and pt verbalized understanding of apt date and time.

## 2019-03-24 NOTE — Progress Notes (Signed)
[place]  [phone] [phone]) [phone]    ID: Brittney Wright   DOB: 05-21-1935  MR#: [id number]  QJJ#:[id number]  Patient Care Team: Lanice Shirts, MD as PCP - General (Internal Medicine) Coralie Keens, MD as Consulting Physician (General Surgery) Icarus Partch, Virgie Dad, MD as Consulting Physician (Medical Oncology)   CHIEF COMPLAINT: estrogen receptor positive breast cancer  CURRENT TREATMENT: observation   HISTORY OF PRESENT ILLNESS: From Dr. Julien Girt new patient evaluation note dated 01/18/2012:  "Brittney Wright is a 83 y.o. female. In [place] who presents with a new diagnosis of DCIS. She undergoes annual screening mammography. She is screening mammogram on 12/30/2011 which showed a subcentimeter nodule upper outer quadrant left breast. No calcifications were appreciated. On ultrasound this measured 7 x 5 x 5 mm. Biopsy performed on 01/10/2011 showed this to be DCIS. It was insufficient tissue for prognostic panel. An MRI scan was not performed. Patient was seen in the [facility] for discussion of her treatment options. She was seen by Dr. Eston Esters, Radiation Oncologist and Surgeon from [facility]."    Her subsequent history is as detailed below.  STAGE:  Cancer of upper-outer quadrant of female breast  Primary site: Breast (Left)  Staging method: AJCC 7th Edition  Clinical: Stage 0 (Tis (DCIS), N0, cM0)  Summary: Stage 0 (Tis (DCIS), N0, cM0)    INTERVAL HISTORY: Brittney Wright returns today for follow-up and treatment of her noninvasive estrogen receptor positive breast cancer. She was last seen here on 05/22/2017. At that visit she was released from follow-up.  She called 03/18/2019 to report her left breast was bruised and red around the nipple. A visit the same day was offered but due to transportation problems the patient could not come to the office until today.  Over the past several days the redness  has pretty much resolved although her nipple still feels a little bit different to her.  She continues under observation.  She is taking appropriate pandemic precautions  Since her last visit here, she underwent a digital screening bilateral mammogram with tomography on 02/23/2018 at [facility] showing: Breast Density Category B. There is no mammographic evidence of malignancy. She has not undergone any more recent mammography.     REVIEW OF SYSTEMS: Brittney Wright is keeping to herself, lives alone, and follows the news obsessively she says.  She has already voided by mail and made sure that it is registered.  She does not exercise regularly aside from housework.  A detailed review of systems today was otherwise unchanged from last visit   PAST MEDICAL HISTORY: Past Medical History:  Diagnosis Date  . Back pain   . Breast cancer ([place]) 12/2011   Left breast DCIS  . Cataract   . Depression   . Diabetes mellitus   . History of radiation therapy 03/28/12- 04/26/12   left breast 4250 cGy 17 sessions, left breast boost 750 cGy 3 sessions  . Hot flashes   . Hypertension   . Personal history of radiation therapy   . SOB (shortness of breath) on exertion     PAST SURGICAL HISTORY: Past Surgical History:  Procedure Laterality Date  . BREAST BIOPSY    . BREAST LUMPECTOMY     left 2013  . BREAST SURGERY  1976   rt br bx  . CATARACT EXTRACTION Bilateral    08/2011 and 05/2011  . COLONOSCOPY    . DILATION AND CURETTAGE OF UTERUS    . EYE  SURGERY  12,13   cataracts-both    FAMILY HISTORY Family History  Problem Relation Age of Onset  . Heart attack Mother   . Stroke Mother   . Heart attack Father   . Hypertension Sister   . Heart Problems Paternal Grandfather   . Breast cancer Paternal Aunt     GYNECOLOGIC HISTORY: G3, P2, 1 miscarriage, menarche age 71, age of parity 44,  menopause in her 24s, she used hormone replacement therapy for 5 years.  SOCIAL HISTORY: Brittney Wright has  been divorced for over 12 years.  She has an adult daughter who lives in [place] and an adult son who lives in [place].  She has 4 grandchildren by marriage.  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  In her spare time she enjoys reading and watching sports.  ADVANCED DIRECTIVES:  Her daughter is her healthcare power of attorney  HEALTH MAINTENANCE: Social History   Tobacco Use  . Smoking status: Former Smoker    Quit date: 06/21/1991    Years since quitting: 27.7  . Smokeless tobacco: Never Used  Substance Use Topics  . Alcohol use: Yes    Comment: Rarely  . Drug use: No    Colonoscopy: 2009 PAP: 2010 Bone density: 2000 Lipid panel: Not on file  Allergies  Allergen Reactions  . Minocycline     Tingling face/tongue, flushing    Current Outpatient Medications  Medication Sig Dispense Refill  . ACCU-CHEK AVIVA PLUS test strip 1 each by Other route as needed.     [name] atorvastatin (LIPITOR) 80 MG tablet Take 80 mg by mouth daily.    [name] BABY ASPIRIN PO Take 81 mg by mouth daily.    . Betamethasone Valerate 0.12 % foam Apply 1 application topically as needed.     . Cholecalciferol (VITAMIN D-400 PO) Take 1 tablet by mouth daily.    . DULoxetine (CYMBALTA) 20 MG capsule Reported on 08/25/2015    . fenofibrate 160 MG tablet Take 160 mg by mouth daily.    [name] glipiZIDE (GLUCOTROL) 10 MG tablet Take 10 mg by mouth daily.    . Lancets (ACCU-CHEK MULTICLIX) lancets 1 each by Other route as needed.     . latanoprost (XALATAN) 0.005 % ophthalmic solution Place 1 drop into both eyes at bedtime.     [name] lisinopril-hydrochlorothiazide (PRINZIDE,ZESTORETIC) 20-12.5 MG per tablet Take 1 tablet by mouth daily.    . pioglitazone (ACTOS) 30 MG tablet Take 30 mg by mouth daily.    . tamoxifen (NOLVADEX) 20 MG tablet Take 1 tablet by mouth once daily 90 tablet 2  . torsemide (DEMADEX) 10 MG tablet Take 10 mg by mouth daily.     No current  facility-administered medications for this visit.     OBJECTIVE: Older white woman in no acute distress  There were no vitals filed for this visit. Wt Readings from Last 3 Encounters:  10/06/17 215 lb (97.5 kg)  05/22/17 210 lb 14.4 oz (95.7 kg)  04/18/16 223 lb 12.8 oz (101.5 kg)   There is no height or weight on file to calculate BMI.    ECOG FS:1 - Symptomatic but completely ambulatory  Ocular: Sclerae unicteric, pupils round and equal Ear-nose-throat: Wearing a mask Lymphatic: No cervical or supraclavicular adenopathy Lungs no rales or rhonchi Heart regular rate and rhythm Abd soft, nontender, positive bowel sounds MSK no focal spinal tenderness, no joint edema Neuro: non-focal, well-oriented,  appropriate affect Breasts: The right breast is unremarkable.  The left breast is status post lumpectomy and radiation.  There is no erythema.  There is no palpable mass in the breast.  In the upper left chest, just anterior to the shoulder, above and lateral to the incision, there is an area of induration which has not changed.  Both axillae are benign.   LAB RESULTS: No results found.   Lab Results  Component Value Date   WBC 4.8 06/18/2012   NEUTROABS 2.9 06/18/2012   HGB 11.3 (L) 06/18/2012   HCT 34.4 (L) 06/18/2012   MCV 91.1 06/18/2012   PLT 231 06/18/2012      Chemistry      Component Value Date/Time   NA 139 06/18/2012 1039   K 4.9 06/18/2012 1039   CL 107 06/18/2012 1039   CO2 25 06/18/2012 1039   BUN 30.0 (H) 06/18/2012 1039   CREATININE 1.7 (H) 06/18/2012 1039      Component Value Date/Time   CALCIUM 8.9 06/18/2012 1039   ALKPHOS 51 06/18/2012 1039   AST 17 06/18/2012 1039   ALT 11 06/18/2012 1039   BILITOT 0.30 06/18/2012 1039     No results found for: LABCA2  Urinalysis    Component Value Date/Time   COLORURINE YELLOW 08/21/2010 0221   APPEARANCEUR CLEAR 08/21/2010 0221   LABSPEC 1.014 08/21/2010 0221   PHURINE 5.0 08/21/2010 0221   GLUCOSEU  100 (A) 08/21/2010 0221   HGBUR NEGATIVE 08/21/2010 0221   BILIRUBINUR NEGATIVE 08/21/2010 0221   KETONESUR NEGATIVE 08/21/2010 0221   PROTEINUR NEGATIVE 08/21/2010 0221   UROBILINOGEN 0.2 08/21/2010 0221   NITRITE NEGATIVE 08/21/2010 0221   LEUKOCYTESUR SMALL (A) 08/21/2010 0221    STUDIES: No results found.  ASSESSMENT: Ms. Festa is a 84 y.o. [place], [place] woman:  1.  Status post left breast lumpectomy on 02/03/2012 for a  pTis, pNX, pMX, stage 0 ductal carcinoma in situ, grade 2, estrogen receptor 100% positive, progesterone receptor 100% positive.  2. received radiation therapy from 03/28/2012 through 04/26/2012.  3. started Tamoxifen in 06/2012, completing 5 years December 2018  PLAN: Ival is now 8 years out from initial diagnosis of her noninvasive breast cancer, with no obvious evidence of disease recurrence.  This is very favorable.  I think she had a mild cellulitis of the left breast.  She was able to clear it with antibiotics which is in itself a good sign.  Nevertheless she is behind on mammography and I am also adding a left breast ultrasound just to make sure there are no findings of concern.  I will send her a copy of those results when they come in.  I offered her follow-up peers through our survivorship program.  She will consider it.  Otherwise I am making no return appointment here.  I will be glad to continue to see her on an as-needed basis.  Neev Mcmains, Virgie Dad, MD  03/25/19 8:39 AM Medical Oncology and Hematology [facility] [address] [place], [place] [address] Tel. [phone]    Fax. [phone]  I, Jacqualyn Posey am acting as a Education administrator for Chauncey Cruel, MD.   I, Lurline Del MD, have reviewed the above documentation for accuracy and completeness, and I agree with the above.

## 2019-03-25 ENCOUNTER — Inpatient Hospital Stay: Payer: PPO | Attending: Oncology | Admitting: Oncology

## 2019-03-25 ENCOUNTER — Other Ambulatory Visit: Payer: Self-pay

## 2019-03-25 VITALS — BP 141/59 | HR 84 | Temp 97.8°F | Resp 18 | Wt 225.5 lb

## 2019-03-25 DIAGNOSIS — E1122 Type 2 diabetes mellitus with diabetic chronic kidney disease: Secondary | ICD-10-CM | POA: Diagnosis not present

## 2019-03-25 DIAGNOSIS — N183 Chronic kidney disease, stage 3 unspecified: Secondary | ICD-10-CM | POA: Diagnosis not present

## 2019-03-25 DIAGNOSIS — Z923 Personal history of irradiation: Secondary | ICD-10-CM | POA: Insufficient documentation

## 2019-03-25 DIAGNOSIS — H35033 Hypertensive retinopathy, bilateral: Secondary | ICD-10-CM | POA: Diagnosis not present

## 2019-03-25 DIAGNOSIS — Z853 Personal history of malignant neoplasm of breast: Secondary | ICD-10-CM | POA: Insufficient documentation

## 2019-03-25 DIAGNOSIS — C50412 Malignant neoplasm of upper-outer quadrant of left female breast: Secondary | ICD-10-CM | POA: Diagnosis not present

## 2019-03-25 DIAGNOSIS — Z170 Estrogen receptor positive status [ER+]: Secondary | ICD-10-CM | POA: Diagnosis not present

## 2019-03-25 DIAGNOSIS — E1129 Type 2 diabetes mellitus with other diabetic kidney complication: Secondary | ICD-10-CM | POA: Diagnosis not present

## 2019-03-25 DIAGNOSIS — Z9223 Personal history of estrogen therapy: Secondary | ICD-10-CM | POA: Insufficient documentation

## 2019-03-25 DIAGNOSIS — D631 Anemia in chronic kidney disease: Secondary | ICD-10-CM | POA: Diagnosis not present

## 2019-03-25 DIAGNOSIS — N2581 Secondary hyperparathyroidism of renal origin: Secondary | ICD-10-CM | POA: Diagnosis not present

## 2019-03-25 DIAGNOSIS — Z961 Presence of intraocular lens: Secondary | ICD-10-CM | POA: Diagnosis not present

## 2019-03-25 DIAGNOSIS — H402231 Chronic angle-closure glaucoma, bilateral, mild stage: Secondary | ICD-10-CM | POA: Diagnosis not present

## 2019-03-25 DIAGNOSIS — H35372 Puckering of macula, left eye: Secondary | ICD-10-CM | POA: Diagnosis not present

## 2019-03-25 DIAGNOSIS — R609 Edema, unspecified: Secondary | ICD-10-CM | POA: Diagnosis not present

## 2019-04-24 ENCOUNTER — Other Ambulatory Visit: Payer: Self-pay | Admitting: Oncology

## 2019-04-24 ENCOUNTER — Ambulatory Visit
Admission: RE | Admit: 2019-04-24 | Discharge: 2019-04-24 | Disposition: A | Discharge status: Home / Self Care | Payer: PPO | Source: Ambulatory Visit | Attending: Oncology | Admitting: Oncology

## 2019-04-24 ENCOUNTER — Other Ambulatory Visit: Payer: Self-pay

## 2019-04-24 DIAGNOSIS — C50412 Malignant neoplasm of upper-outer quadrant of left female breast: Secondary | ICD-10-CM

## 2019-04-24 DIAGNOSIS — Z170 Estrogen receptor positive status [ER+]: Secondary | ICD-10-CM

## 2019-04-24 DIAGNOSIS — Z1231 Encounter for screening mammogram for malignant neoplasm of breast: Secondary | ICD-10-CM

## 2019-04-24 DIAGNOSIS — R922 Inconclusive mammogram: Secondary | ICD-10-CM | POA: Diagnosis not present

## 2019-04-24 DIAGNOSIS — N6320 Unspecified lump in the left breast, unspecified quadrant: Secondary | ICD-10-CM

## 2019-04-24 DIAGNOSIS — N6489 Other specified disorders of breast: Secondary | ICD-10-CM | POA: Diagnosis not present

## 2019-04-25 ENCOUNTER — Other Ambulatory Visit: Payer: Self-pay

## 2019-04-25 ENCOUNTER — Ambulatory Visit
Admission: RE | Admit: 2019-04-25 | Discharge: 2019-04-25 | Disposition: A | Discharge status: Home / Self Care | Payer: PPO | Source: Ambulatory Visit | Attending: Oncology | Admitting: Oncology

## 2019-04-25 DIAGNOSIS — N6320 Unspecified lump in the left breast, unspecified quadrant: Secondary | ICD-10-CM

## 2019-04-25 DIAGNOSIS — N6489 Other specified disorders of breast: Secondary | ICD-10-CM | POA: Diagnosis not present

## 2019-04-25 DIAGNOSIS — N6032 Fibrosclerosis of left breast: Secondary | ICD-10-CM | POA: Diagnosis not present

## 2019-04-26 ENCOUNTER — Other Ambulatory Visit: Payer: Self-pay | Admitting: *Deleted

## 2019-04-26 DIAGNOSIS — C50412 Malignant neoplasm of upper-outer quadrant of left female breast: Secondary | ICD-10-CM

## 2019-05-20 ENCOUNTER — Other Ambulatory Visit: Payer: Self-pay | Admitting: Surgery

## 2019-05-20 DIAGNOSIS — N6459 Other signs and symptoms in breast: Secondary | ICD-10-CM | POA: Diagnosis not present

## 2019-05-20 DIAGNOSIS — L905 Scar conditions and fibrosis of skin: Secondary | ICD-10-CM | POA: Diagnosis not present

## 2019-05-24 ENCOUNTER — Telehealth: Payer: Self-pay | Admitting: *Deleted

## 2019-05-24 NOTE — Telephone Encounter (Signed)
This RN spoke with pt per her call stating concerns and " what to do next " since she has had 2 biopsies with negative readings.  Of note per last dictation :  Brittney Wright is now 8 years out from initial diagnosis of her noninvasive breast cancer, with no obvious evidence of disease recurrence.  This is very favorable.  I think she had a mild cellulitis of the left breast.  She was able to clear it with antibiotics which is in itself a good sign.  Nevertheless she is behind on mammography and I am also adding a left breast ultrasound just to make sure there are no findings of concern.  I will send her a copy of those results when they come in.  Brittney Wright states she had mammogram with U/S - showing area of concern- and had needle biopsy negative for cancer.  She was told by the radiologist she had dense breast therefore areas in breast could not be seen well . She was then sent to Dr Rush Farmer who performed a punch biopsy of area of thickened skin which was negative for cancer.  Brittney Wright concern is  " what do we do about my breast being dense and I cannot have the MRI ( she states she has kidney issues and cannot do MRIs ) and what does this mean?  This RN reviewed issues relating to dense breast and mammograms. Reassured her that the 2 noted areas have been evaluated by biopsy with no cancer being found.  If MRI's not able to be done- pt may need higher surveillance by other means but her concerns will be reviewed with MD for further recommendations.

## 2019-06-07 ENCOUNTER — Other Ambulatory Visit: Payer: Self-pay | Admitting: Oncology

## 2019-06-07 ENCOUNTER — Encounter: Payer: Self-pay | Admitting: Oncology

## 2019-06-07 DIAGNOSIS — C50412 Malignant neoplasm of upper-outer quadrant of left female breast: Secondary | ICD-10-CM

## 2019-06-07 MED ORDER — ANASTROZOLE 1 MG PO TABS: 1.0000 mg | ORAL_TABLET | Freq: Every day | ORAL | 4 refills | Status: DC

## 2019-06-10 ENCOUNTER — Telehealth: Payer: Self-pay | Admitting: Oncology

## 2019-06-10 NOTE — Telephone Encounter (Signed)
Scheduled apt per 12/18 sch message - pt is aware of appt date and time

## 2019-06-30 ENCOUNTER — Ambulatory Visit: Payer: Medicare Other | Attending: Internal Medicine

## 2019-06-30 DIAGNOSIS — Z23 Encounter for immunization: Secondary | ICD-10-CM

## 2019-06-30 NOTE — Progress Notes (Signed)
   Covid-19 Vaccination Clinic  Name:  Brittney Wright    MRN: [id number] DOB: Nov 20, 1934  06/30/2019  Brittney Wright was observed post Covid-19 immunization for 30 minutes based on pre-vaccination screening without incidence. She was provided with Vaccine Information Sheet and instruction to access the V-Safe system. Patient complained of some hot flushes. I stayed at patient side during full thirty minutes of observation. Patient BP 140/90. Patient reports she has hypertension and takes medication for it- patient reports having that this morning. After 17 mins of observation patient states her hot flush has subsided. Patient does have hot flushes from tamoxifen use. Patient accompanied by her daughter upon completion of observation.  Brittney Wright was instructed to call 911 with any severe reactions post vaccine: [name] Difficulty breathing  . Swelling of your face and throat  . A fast heartbeat  . A bad rash all over your body  . Dizziness and weakness    Immunizations Administered    Name Date Dose VIS Date Route   Pfizer COVID-19 Vaccine 06/30/2019 11:59 AM 0.3 mL 05/31/2019 Intramuscular   Manufacturer: [employer], [employer]   Lot: [id number]   [place]: [id number]

## 2019-07-10 NOTE — Telephone Encounter (Signed)
No entry 

## 2019-07-15 DIAGNOSIS — N184 Chronic kidney disease, stage 4 (severe): Secondary | ICD-10-CM | POA: Diagnosis not present

## 2019-07-15 DIAGNOSIS — E11319 Type 2 diabetes mellitus with unspecified diabetic retinopathy without macular edema: Secondary | ICD-10-CM | POA: Diagnosis not present

## 2019-07-15 DIAGNOSIS — Z5181 Encounter for therapeutic drug level monitoring: Secondary | ICD-10-CM | POA: Diagnosis not present

## 2019-07-15 DIAGNOSIS — Z7189 Other specified counseling: Secondary | ICD-10-CM | POA: Diagnosis not present

## 2019-07-15 DIAGNOSIS — E1142 Type 2 diabetes mellitus with diabetic polyneuropathy: Secondary | ICD-10-CM | POA: Diagnosis not present

## 2019-07-15 DIAGNOSIS — E1122 Type 2 diabetes mellitus with diabetic chronic kidney disease: Secondary | ICD-10-CM | POA: Diagnosis not present

## 2019-07-20 ENCOUNTER — Ambulatory Visit: Payer: PPO | Attending: Internal Medicine

## 2019-07-20 DIAGNOSIS — R0789 Other chest pain: Secondary | ICD-10-CM | POA: Diagnosis not present

## 2019-07-20 DIAGNOSIS — R079 Chest pain, unspecified: Secondary | ICD-10-CM | POA: Diagnosis not present

## 2019-07-20 DIAGNOSIS — Z23 Encounter for immunization: Secondary | ICD-10-CM

## 2019-07-20 NOTE — Progress Notes (Addendum)
   Covid-19 Vaccination Clinic  Name:  Brittney Wright    MRN: [id number] DOB: 05/30/1935  07/20/2019  [name] was observed post Covid-19 immunization for 50 min post injection. [name] was called to assist [name], she complained on body flushing and tightness midsternum with clamminess@ 12:05pm  She stated it happen with the first shot but was not going away.  EMT was notified and taken to a private room. BP 127/72 HR 72, RA 99% , CBG 126.  EKG preformed showed sinus rhythm. Patient observed for another 20 mins with EMT, patient started to feel better. Patient discharge with RN assistance to car. [name] gave strong recommendation with any further chest pain and discussed signs and symptoms for women and was advised to call 911 if needed. Daughter [name] was in attendance was also given instructional directions, both verbalize understanding. Patient was chest pain free, symptoms resolved at time of discharge.[name] She was provided with Vaccine Information Sheet and instruction to access the V-Safe system.   [name] was instructed to call 911 with any severe reactions post vaccine: [name] Difficulty breathing  . Swelling of your face and throat  . A fast heartbeat  . A bad rash all over your body  . Dizziness and weakness    Immunizations Administered    Name Date Dose VIS Date Route   Pfizer COVID-19 Vaccine 07/20/2019 11:47 AM 0.3 mL 05/31/2019 Intramuscular   Manufacturer: [place]   Lot: [id number]   [place]: [id number]

## 2019-08-23 ENCOUNTER — Telehealth: Payer: Self-pay | Admitting: Oncology

## 2019-08-23 NOTE — Telephone Encounter (Signed)
R/s appt per pt request ( per vmail )  - left message with appt date and time

## 2019-09-27 ENCOUNTER — Encounter: Payer: Self-pay | Admitting: Oncology

## 2019-09-30 ENCOUNTER — Other Ambulatory Visit: Payer: Self-pay | Admitting: Oncology

## 2019-09-30 DIAGNOSIS — C50412 Malignant neoplasm of upper-outer quadrant of left female breast: Secondary | ICD-10-CM

## 2019-10-01 DIAGNOSIS — Z853 Personal history of malignant neoplasm of breast: Secondary | ICD-10-CM | POA: Diagnosis not present

## 2019-10-01 DIAGNOSIS — E1129 Type 2 diabetes mellitus with other diabetic kidney complication: Secondary | ICD-10-CM | POA: Diagnosis not present

## 2019-10-01 DIAGNOSIS — N1832 Chronic kidney disease, stage 3b: Secondary | ICD-10-CM | POA: Diagnosis not present

## 2019-10-01 DIAGNOSIS — R609 Edema, unspecified: Secondary | ICD-10-CM | POA: Diagnosis not present

## 2019-10-01 DIAGNOSIS — N2581 Secondary hyperparathyroidism of renal origin: Secondary | ICD-10-CM | POA: Diagnosis not present

## 2019-10-01 DIAGNOSIS — I129 Hypertensive chronic kidney disease with stage 1 through stage 4 chronic kidney disease, or unspecified chronic kidney disease: Secondary | ICD-10-CM | POA: Diagnosis not present

## 2019-10-01 DIAGNOSIS — N189 Chronic kidney disease, unspecified: Secondary | ICD-10-CM | POA: Diagnosis not present

## 2019-10-01 DIAGNOSIS — E1122 Type 2 diabetes mellitus with diabetic chronic kidney disease: Secondary | ICD-10-CM | POA: Diagnosis not present

## 2019-10-01 DIAGNOSIS — Z6837 Body mass index (BMI) 37.0-37.9, adult: Secondary | ICD-10-CM | POA: Diagnosis not present

## 2019-10-14 DIAGNOSIS — E11319 Type 2 diabetes mellitus with unspecified diabetic retinopathy without macular edema: Secondary | ICD-10-CM | POA: Diagnosis not present

## 2019-10-14 DIAGNOSIS — R609 Edema, unspecified: Secondary | ICD-10-CM | POA: Diagnosis not present

## 2019-10-14 DIAGNOSIS — N184 Chronic kidney disease, stage 4 (severe): Secondary | ICD-10-CM | POA: Diagnosis not present

## 2019-10-14 DIAGNOSIS — E1122 Type 2 diabetes mellitus with diabetic chronic kidney disease: Secondary | ICD-10-CM | POA: Diagnosis not present

## 2019-10-14 DIAGNOSIS — E1142 Type 2 diabetes mellitus with diabetic polyneuropathy: Secondary | ICD-10-CM | POA: Diagnosis not present

## 2019-10-14 DIAGNOSIS — Z5181 Encounter for therapeutic drug level monitoring: Secondary | ICD-10-CM | POA: Diagnosis not present

## 2019-10-17 ENCOUNTER — Ambulatory Visit: Payer: PPO | Admitting: Oncology

## 2019-10-17 ENCOUNTER — Other Ambulatory Visit: Payer: PPO

## 2019-10-28 NOTE — Progress Notes (Signed)
Brittney Wright  [phone] [phone]    ID: Brittney Wright   DOB: [birth year]  MR#: [id number]  VXY#:[id number]  Patient Care Team: Brittney Cha, MD as PCP - General (Internal Medicine) Brittney Keens, MD as Consulting Physician (General Surgery) Brittney Wright, Brittney Dad, MD as Consulting Physician (Medical Oncology) Lavonna Monarch, MD as Consulting Physician (Dermatology) Monna Fam, MD as Consulting Physician (Ophthalmology)   CHIEF COMPLAINT: estrogen receptor positive breast cancer  CURRENT TREATMENT: observation   INTERVAL HISTORY: Brittney Wright returns today for follow-up of her noninvasive estrogen receptor positive breast cancer.  I had discharge her from follow-up after her last visit with me 03/25/2019.  However sinceher last visit, she underwent bilateral diagnostic mammography with tomography at [facility] on 04/24/2019 showing: breast density category C; indeterminate interval shrinking of left breast with increased central parenchymal density; skin thickening overlying left breast.  She proceeded to biopsy of the left breast area in question on 04/25/2019. Pathology from the procedure ([id number]) showed dense fibrosis.  She also underwent biopsy of the skin of the left breast on 05/20/2019. Pathology [id number]) showed dermal fibrosis with sparse inflammation.   REVIEW OF SYSTEMS: Quaniya tells me she has no transportation and depends on her daughter who lives in [place] to drive her.  Accordingly she scheduled appointments with Dr. Denna Haggard her dermatologist and Dr. Herbert Deaner her ophthalmologist today as well as with's.  She generally feels she is doing well, has significant problems with edema and kidney issues which are being addressed by renal.  She sleeps poorly.  She gets very anxious when she lies down.  She is going to give melatonin to try for that.  Aside from these issues a detailed review of systems today was  stable   HISTORY OF PRESENT ILLNESS: From Dr. Julien Girt new patient evaluation note dated 01/18/2012:  "Brittney Wright is a 85 y.o. female. In [place] who presents with a new diagnosis of DCIS. She undergoes annual screening mammography. She is screening mammogram on 12/30/2011 which showed a subcentimeter nodule upper outer quadrant left breast. No calcifications were appreciated. On ultrasound this measured 7 x 5 x 5 mm. Biopsy performed on 01/10/2011 showed this to be DCIS. It was insufficient tissue for prognostic panel. An MRI scan was not performed. Patient was seen in the [facility] for discussion of her treatment options. She was seen by Dr. Eston Esters, Radiation Oncologist and Surgeon from [facility]."    Her subsequent history is as detailed below.  STAGE:  Cancer of upper-outer quadrant of female breast  Primary site: Breast (Left)  Staging method: AJCC 7th Edition  Clinical: Stage 0 (Tis (DCIS), N0, cM0)  Summary: Stage 0 (Tis (DCIS), N0, cM0)    PAST MEDICAL HISTORY: Past Medical History:  Diagnosis Date  . Back pain   . Breast cancer ([place]) 12/2011   Left breast DCIS  . Cataract   . Depression   . Diabetes mellitus   . History of radiation therapy 03/28/12- 04/26/12   left breast 4250 cGy 17 sessions, left breast boost 750 cGy 3 sessions  . Hot flashes   . Hypertension   . Personal history of radiation therapy   . SOB (shortness of breath) on exertion     PAST SURGICAL HISTORY: Past Surgical History:  Procedure Laterality Date  . BREAST BIOPSY    . BREAST LUMPECTOMY Left 2013  . BREAST SURGERY  1976   rt br bx  . CATARACT EXTRACTION Bilateral  08/2011 and 05/2011  . COLONOSCOPY    . DILATION AND CURETTAGE OF UTERUS    . EYE SURGERY  12,13   cataracts-both    FAMILY HISTORY Family History  Problem Relation Age of Onset  . Heart attack Mother   . Stroke Mother   . Heart attack Father   . Hypertension Sister   .  Heart Problems Paternal Grandfather   . Breast cancer Paternal Aunt     GYNECOLOGIC HISTORY: G3, P2, 1 miscarriage, menarche age 51, age of parity 61,  menopause in her 10s, she used hormone replacement therapy for 5 years.   SOCIAL HISTORY: Brittney Wright has been divorced for over 8 years.  She has an adult daughter who lives in [place] and an adult son who lives in [place].  She has 4 grandchildren by marriage.  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  In her spare time she enjoys reading and watching sports.   ADVANCED DIRECTIVES:  Her daughter is her healthcare power of attorney   HEALTH MAINTENANCE: Social History   Tobacco Use  . Smoking status: Former Smoker    Quit date: 06/21/1991    Years since quitting: 28.3  . Smokeless tobacco: Never Used  Substance Use Topics  . Alcohol use: Yes    Comment: Rarely  . Drug use: No    Colonoscopy: 2009 PAP: 2010 Bone density: 2000   Allergies  Allergen Reactions  . Minocycline     Tingling face/tongue, flushing    Current Outpatient Medications  Medication Sig Dispense Refill  . ACCU-CHEK AVIVA PLUS test strip 1 each by Other route as needed.     [name] atorvastatin (LIPITOR) 80 MG tablet Take 80 mg by mouth daily.    [name] BABY ASPIRIN PO Take 81 mg by mouth daily.    . Betamethasone Valerate 0.12 % foam Apply 1 application topically as needed.     . carvedilol (COREG) 6.25 MG tablet Take 6.25 mg by mouth 2 (two) times daily with a meal.    . Cholecalciferol (VITAMIN D-400 PO) Take 1 tablet by mouth daily.    [name] glipiZIDE (GLUCOTROL) 10 MG tablet Take 10 mg by mouth daily.    . Lancets (ACCU-CHEK MULTICLIX) lancets 1 each by Other route as needed.     . latanoprost (XALATAN) 0.005 % ophthalmic solution Place 1 drop into both eyes at bedtime.     . pioglitazone (ACTOS) 30 MG tablet Take 30 mg by mouth daily.    [name] torsemide (DEMADEX) 10 MG tablet Take 10 mg by mouth  daily.     No current facility-administered medications for this visit.    OBJECTIVE: white woman who appears younger than stated age 85:   10/29/19 1538  BP: (!) 147/77  Pulse: 83  Resp: 18  Temp: 98.3 F (36.8 C)  SpO2: 99%   Wt Readings from Last 3 Encounters:  10/29/19 224 lb (101.6 kg)  03/25/19 225 lb 8 oz (102.3 kg)  10/06/17 215 lb (97.5 kg)   Body mass index is 37.86 kg/m.    ECOG FS:1 - Symptomatic but completely ambulatory  Sclerae unicteric, EOMs intact Wearing a mask No cervical or supraclavicular adenopathy Lungs no rales or rhonchi Heart regular rate and rhythm Abd soft, nontender, positive bowel sounds MSK no focal spinal tenderness, no upper extremity lymphedema Neuro: nonfocal, well oriented, appropriate affect Breasts: The right breast is benign.  The left breast  has undergone lumpectomy followed by radiation.  There is significant distortion of the breast contour and significant scar tissue but I do not palpate anything suspicious for recurrent or residual disease.  Both axillae are benign.   LAB RESULTS: No results found.   Lab Results  Component Value Date   WBC 4.8 06/18/2012   NEUTROABS 2.9 06/18/2012   HGB 11.3 (L) 06/18/2012   HCT 34.4 (L) 06/18/2012   MCV 91.1 06/18/2012   PLT 231 06/18/2012      Chemistry      Component Value Date/Time   NA 139 06/18/2012 1039   K 4.9 06/18/2012 1039   CL 107 06/18/2012 1039   CO2 25 06/18/2012 1039   BUN 30.0 (H) 06/18/2012 1039   CREATININE 1.7 (H) 06/18/2012 1039      Component Value Date/Time   CALCIUM 8.9 06/18/2012 1039   ALKPHOS 51 06/18/2012 1039   AST 17 06/18/2012 1039   ALT 11 06/18/2012 1039   BILITOT 0.30 06/18/2012 1039     No results found for: LABCA2  Urinalysis    Component Value Date/Time   COLORURINE YELLOW 08/21/2010 0221   APPEARANCEUR CLEAR 08/21/2010 0221   LABSPEC 1.014 08/21/2010 0221   PHURINE 5.0 08/21/2010 0221   GLUCOSEU 100 (A) 08/21/2010 0221    HGBUR NEGATIVE 08/21/2010 0221   BILIRUBINUR NEGATIVE 08/21/2010 0221   KETONESUR NEGATIVE 08/21/2010 0221   PROTEINUR NEGATIVE 08/21/2010 0221   UROBILINOGEN 0.2 08/21/2010 0221   NITRITE NEGATIVE 08/21/2010 0221   LEUKOCYTESUR SMALL (A) 08/21/2010 0221    STUDIES: No results found.   ASSESSMENT: Brittney Wright is a 85 y.o. [place] woman:  1.  Status post left breast lumpectomy on 02/03/2012 for a  pTis, pNX, pMX, stage 0 ductal carcinoma in situ, grade 2, estrogen receptor 100% positive, progesterone receptor 100% positive.  2. received radiation therapy from 03/28/2012 through 04/26/2012.  3. started Tamoxifen in 06/2012, completing 5 years December 2018   PLAN: Brittney Wright is now 8 years out from definitive surgery for her breast cancer with no evidence of disease recurrence.  This is very favorable.  She never did start the anastrozole that I prescribed for her.  She does felt she had too many medications on board already.  She understands that might have helped make her breast a little bit less tense.  As far as breast density is concerned we discussed the possibility of an MRI.  This would of course be a more sensitive test but at the same time it very likely would lead to more biopsies like the one she had last year.  I am comfortable continuing with mammography only and that is the plan  If her melatonin does not work for her sleep she will let us know and we will give trazodone a try.  She would like to move her mammograms back to August and we will try to do that for her.  I am setting her up to see me again in a year.  She knows to call for any other issues that may develop before then.  Total encounter time 30 minutes.*  Riggs Dineen, Brittney Dad, MD  10/29/19 4:09 PM Medical Oncology and Hematology [facility] [place], [place] [address] Tel. [phone]    Fax. [phone]   I, Wilburn Mylar, am acting as scribe for Dr.  Virgie Wright. Nikia Levels.  Lindie Spruce MD, have reviewed the above documentation for accuracy and completeness, and I agree with  the above.   *Total Encounter Time as defined by the Centers for Medicare and Medicaid Services includes, in addition to the face-to-face time of a patient visit (documented in the note above) non-face-to-face time: obtaining and reviewing outside history, ordering and reviewing medications, tests or procedures, care coordination (communications with other health care professionals or caregivers) and documentation in the medical record.

## 2019-10-29 ENCOUNTER — Other Ambulatory Visit: Payer: PPO

## 2019-10-29 ENCOUNTER — Other Ambulatory Visit: Payer: Self-pay

## 2019-10-29 ENCOUNTER — Ambulatory Visit: Payer: PPO | Admitting: Dermatology

## 2019-10-29 ENCOUNTER — Inpatient Hospital Stay: Payer: PPO | Attending: Oncology | Admitting: Oncology

## 2019-10-29 ENCOUNTER — Encounter: Payer: Self-pay | Admitting: Dermatology

## 2019-10-29 VITALS — BP 147/77 | HR 83 | Temp 98.3°F | Resp 18 | Ht 64.5 in | Wt 224.0 lb

## 2019-10-29 DIAGNOSIS — Z9223 Personal history of estrogen therapy: Secondary | ICD-10-CM | POA: Diagnosis not present

## 2019-10-29 DIAGNOSIS — D1801 Hemangioma of skin and subcutaneous tissue: Secondary | ICD-10-CM | POA: Diagnosis not present

## 2019-10-29 DIAGNOSIS — Z923 Personal history of irradiation: Secondary | ICD-10-CM | POA: Diagnosis not present

## 2019-10-29 DIAGNOSIS — H0288B Meibomian gland dysfunction left eye, upper and lower eyelids: Secondary | ICD-10-CM | POA: Diagnosis not present

## 2019-10-29 DIAGNOSIS — L570 Actinic keratosis: Secondary | ICD-10-CM

## 2019-10-29 DIAGNOSIS — Z170 Estrogen receptor positive status [ER+]: Secondary | ICD-10-CM | POA: Diagnosis not present

## 2019-10-29 DIAGNOSIS — C50412 Malignant neoplasm of upper-outer quadrant of left female breast: Secondary | ICD-10-CM | POA: Diagnosis not present

## 2019-10-29 DIAGNOSIS — H0102A Squamous blepharitis right eye, upper and lower eyelids: Secondary | ICD-10-CM | POA: Diagnosis not present

## 2019-10-29 DIAGNOSIS — Z1283 Encounter for screening for malignant neoplasm of skin: Secondary | ICD-10-CM

## 2019-10-29 DIAGNOSIS — H0288A Meibomian gland dysfunction right eye, upper and lower eyelids: Secondary | ICD-10-CM | POA: Diagnosis not present

## 2019-10-29 DIAGNOSIS — Z853 Personal history of malignant neoplasm of breast: Secondary | ICD-10-CM | POA: Diagnosis not present

## 2019-10-29 DIAGNOSIS — H402231 Chronic angle-closure glaucoma, bilateral, mild stage: Secondary | ICD-10-CM | POA: Diagnosis not present

## 2019-10-29 DIAGNOSIS — L738 Other specified follicular disorders: Secondary | ICD-10-CM

## 2019-10-29 NOTE — Progress Notes (Signed)
   Follow-Up Visit   Subjective  Brittney Wright is a 86 y.o. female who presents for the following: Annual Exam (left side of nose x 78yr-just red, left cheek-changed size, right side of nose-full of white spots, right arm- dark, back-feels scaly).  Crusts Location: Legs Duration: 1+ year Quality: Increased number and size Associated Signs/Symptoms: Minor irritation Modifying Factors:  Severity:  Timing: Context:   The following portions of the chart were reviewed this encounter and updated as appropriate: Tobacco  Allergies  Meds  Problems  Med Hx  Surg Hx  Fam Hx      Objective  Well appearing patient in no apparent distress; mood and affect are within normal limits.  All sun exposed areas plus back examined.   Assessment & Plan  AK (actinic keratosis) (4) Left Lower Leg - Anterior (2); Right Lower Leg - Anterior (2)  Unless these lesions were to significantly grow or otherwise change, I have advised Brittney Wright to avoid freezing or biopsy.  Hemangioma of skin (2) Left Nasal Sidewall; Left Ala Nasi  Leave if stable  Sebaceous hyperplasia Mid Tip of Nose  Ignore

## 2019-10-30 ENCOUNTER — Telehealth: Payer: Self-pay | Admitting: Oncology

## 2019-10-30 NOTE — Telephone Encounter (Signed)
Scheduled appts per 5/11 los. Pt confirmed appt date and time.

## 2019-11-01 ENCOUNTER — Encounter: Payer: Self-pay | Admitting: Dermatology

## 2019-12-03 ENCOUNTER — Encounter: Payer: Self-pay | Admitting: Dermatology

## 2019-12-04 ENCOUNTER — Other Ambulatory Visit: Payer: Self-pay

## 2019-12-04 DIAGNOSIS — L219 Seborrheic dermatitis, unspecified: Secondary | ICD-10-CM

## 2019-12-04 MED ORDER — CICLOPIROX 1 % EX SHAM: 1.0000 "application " | MEDICATED_SHAMPOO | Freq: Every day | CUTANEOUS | 2 refills | Status: DC

## 2020-01-17 DIAGNOSIS — E11319 Type 2 diabetes mellitus with unspecified diabetic retinopathy without macular edema: Secondary | ICD-10-CM | POA: Diagnosis not present

## 2020-01-17 DIAGNOSIS — Z7984 Long term (current) use of oral hypoglycemic drugs: Secondary | ICD-10-CM | POA: Diagnosis not present

## 2020-01-17 DIAGNOSIS — E1122 Type 2 diabetes mellitus with diabetic chronic kidney disease: Secondary | ICD-10-CM | POA: Diagnosis not present

## 2020-01-17 DIAGNOSIS — E1142 Type 2 diabetes mellitus with diabetic polyneuropathy: Secondary | ICD-10-CM | POA: Diagnosis not present

## 2020-01-17 DIAGNOSIS — R609 Edema, unspecified: Secondary | ICD-10-CM | POA: Diagnosis not present

## 2020-01-17 DIAGNOSIS — N184 Chronic kidney disease, stage 4 (severe): Secondary | ICD-10-CM | POA: Diagnosis not present

## 2020-02-20 ENCOUNTER — Other Ambulatory Visit: Payer: Self-pay | Admitting: *Deleted

## 2020-02-20 ENCOUNTER — Encounter: Payer: Self-pay | Admitting: Oncology

## 2020-02-20 DIAGNOSIS — C50412 Malignant neoplasm of upper-outer quadrant of left female breast: Secondary | ICD-10-CM

## 2020-03-09 ENCOUNTER — Ambulatory Visit
Admission: RE | Admit: 2020-03-09 | Discharge: 2020-03-09 | Disposition: A | Discharge status: Home / Self Care | Payer: PPO | Source: Ambulatory Visit | Attending: Oncology | Admitting: Oncology

## 2020-03-09 ENCOUNTER — Other Ambulatory Visit: Payer: Self-pay

## 2020-03-09 DIAGNOSIS — R922 Inconclusive mammogram: Secondary | ICD-10-CM | POA: Diagnosis not present

## 2020-03-09 DIAGNOSIS — Z961 Presence of intraocular lens: Secondary | ICD-10-CM | POA: Diagnosis not present

## 2020-03-09 DIAGNOSIS — H402231 Chronic angle-closure glaucoma, bilateral, mild stage: Secondary | ICD-10-CM | POA: Diagnosis not present

## 2020-03-09 DIAGNOSIS — Z853 Personal history of malignant neoplasm of breast: Secondary | ICD-10-CM | POA: Diagnosis not present

## 2020-03-09 DIAGNOSIS — C50412 Malignant neoplasm of upper-outer quadrant of left female breast: Secondary | ICD-10-CM

## 2020-03-09 DIAGNOSIS — E119 Type 2 diabetes mellitus without complications: Secondary | ICD-10-CM | POA: Diagnosis not present

## 2020-03-09 DIAGNOSIS — H35033 Hypertensive retinopathy, bilateral: Secondary | ICD-10-CM | POA: Diagnosis not present

## 2020-03-13 DIAGNOSIS — K5900 Constipation, unspecified: Secondary | ICD-10-CM | POA: Diagnosis not present

## 2020-03-17 ENCOUNTER — Ambulatory Visit: Payer: PPO | Attending: Internal Medicine

## 2020-03-17 DIAGNOSIS — Z23 Encounter for immunization: Secondary | ICD-10-CM

## 2020-03-17 NOTE — Progress Notes (Signed)
° °  Covid-19 Vaccination Clinic  Name:  Islay Polanco    MRN: [id number] DOB: [birth year]  03/17/2020  Ms. Searles was observed post Covid-19 immunization for 30 minutes based on pre-vaccination screening without incident. She was provided with Vaccine Information Sheet and instruction to access the V-Safe system.   Ms. Amodei was instructed to call 911 with any severe reactions post vaccine:  Difficulty breathing   Swelling of face and throat   A fast heartbeat   A bad rash all over body   Dizziness and weakness

## 2020-03-26 IMAGING — MG DIGITAL DIAGNOSTIC BILAT W/ TOMO W/ CAD
8 series · 8 of 24 positions shown · non-contrast
Comparison: Previous exam(s).

CLINICAL DATA: Patient with history of left breast lumpectomy 9689.
Patient with left breast thickening.

EXAM:
DIGITAL DIAGNOSTIC BILATERAL MAMMOGRAM WITH CAD AND TOMO
ULTRASOUND LEFT BREAST

[R MLO synth-2D]
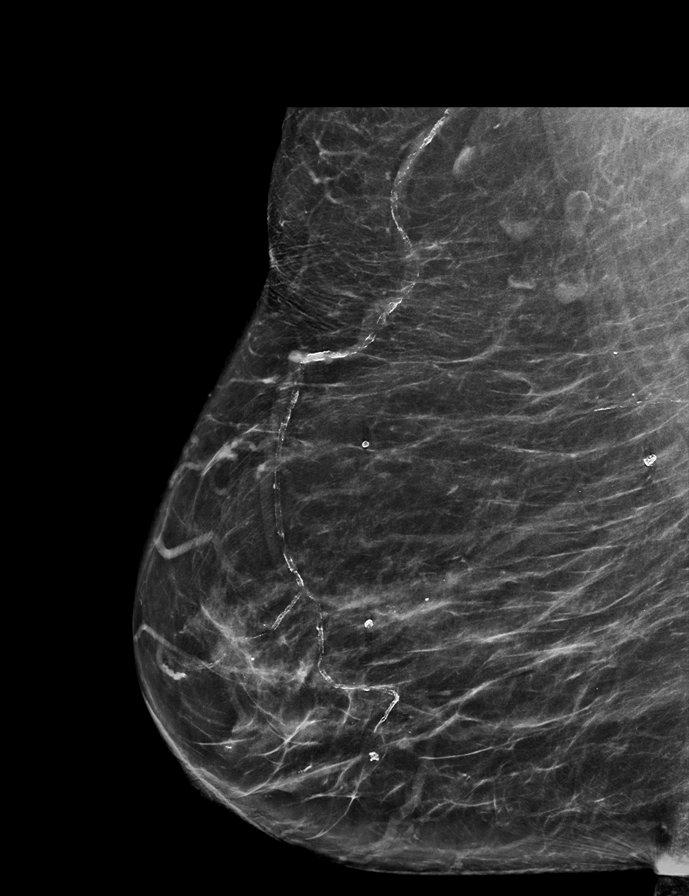

[R CC synth-2D]
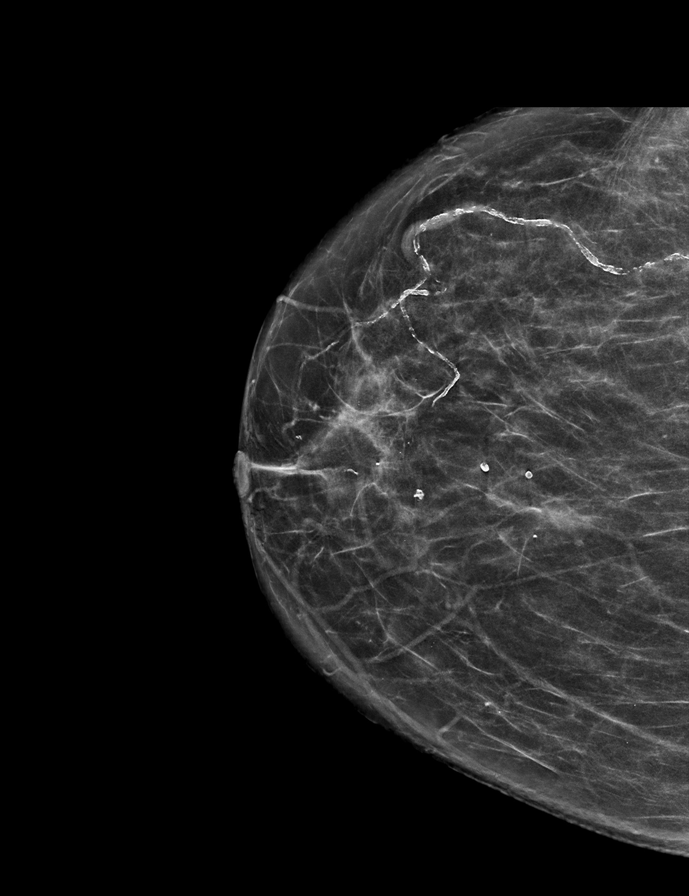

[L MLO synth-2D]
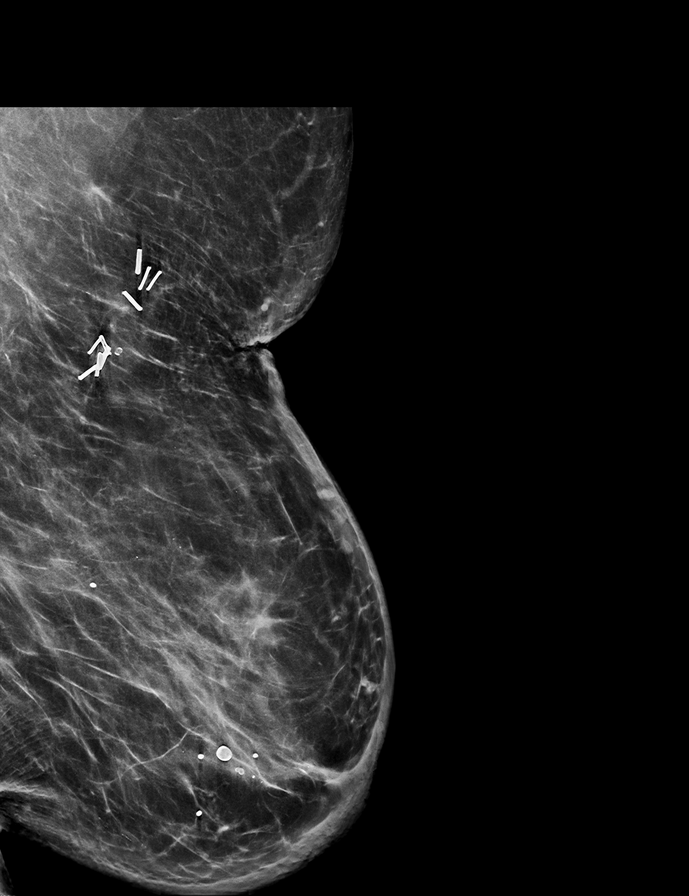

[L CC synth-2D]
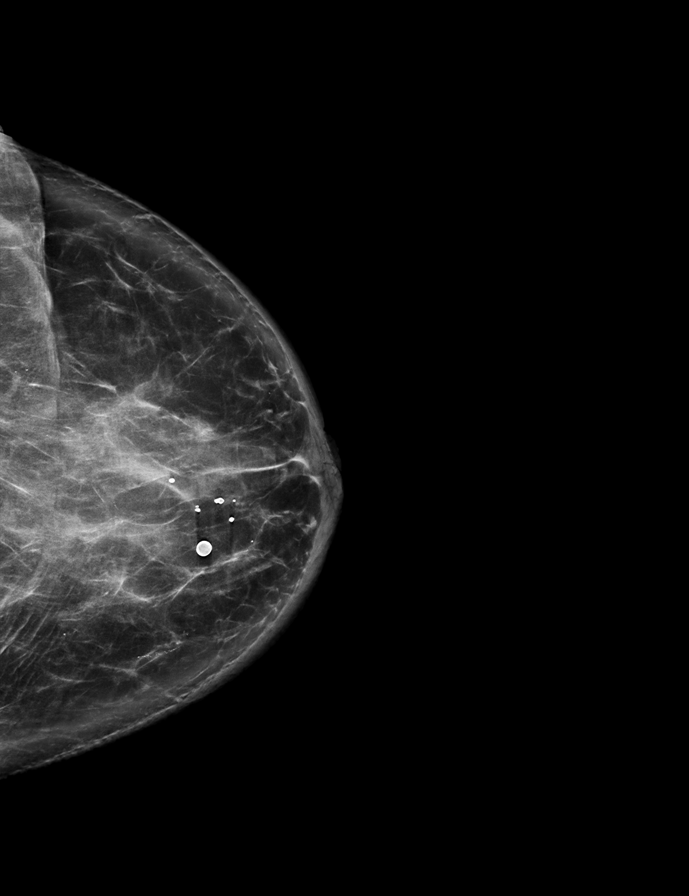

[L CC tomo · tomo slice 40/79.0]
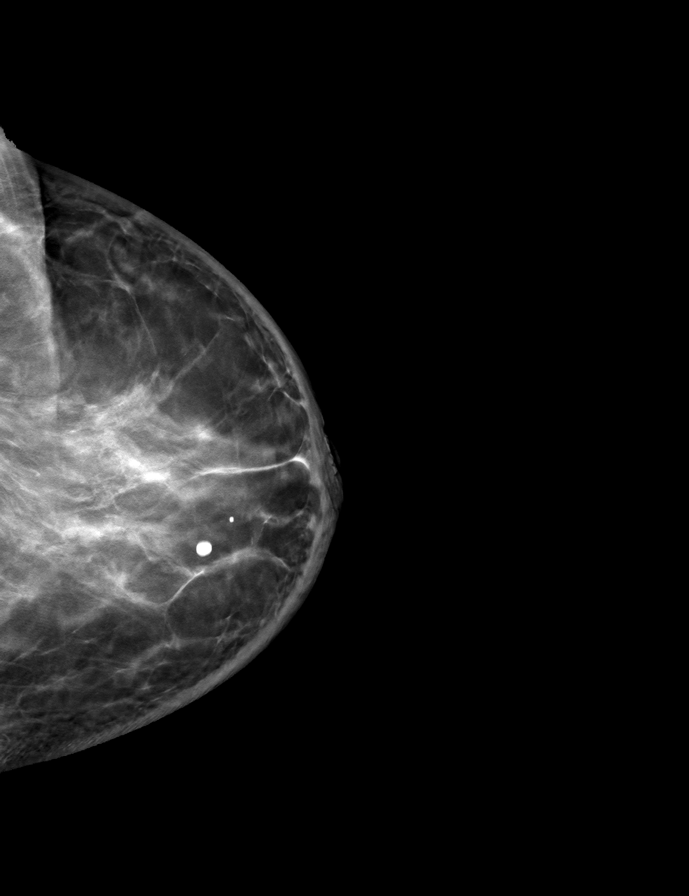

[R MLO tomo · tomo slice 37/73.0]
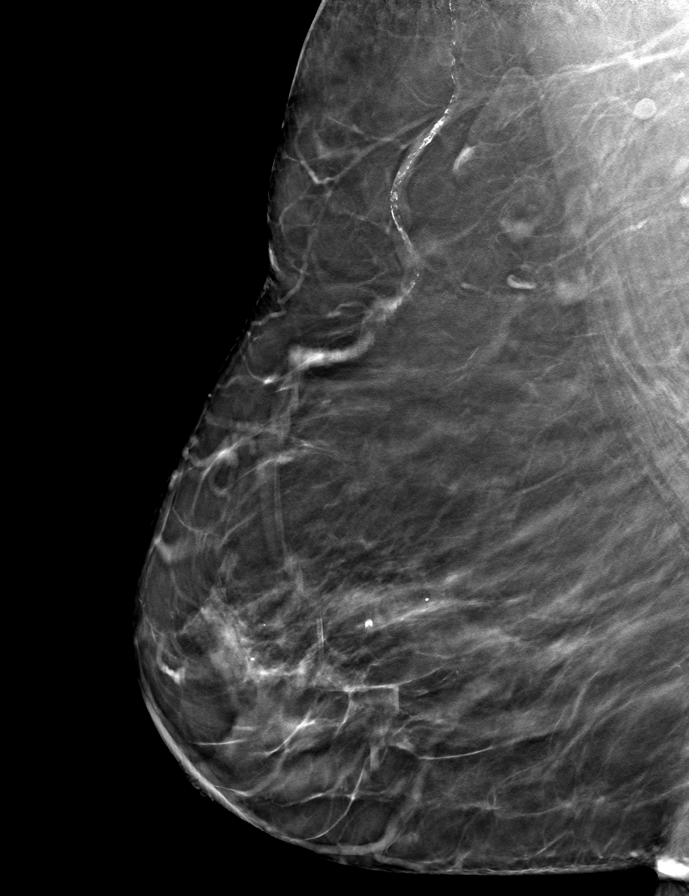

[L MLO tomo · tomo slice 44/87.0]
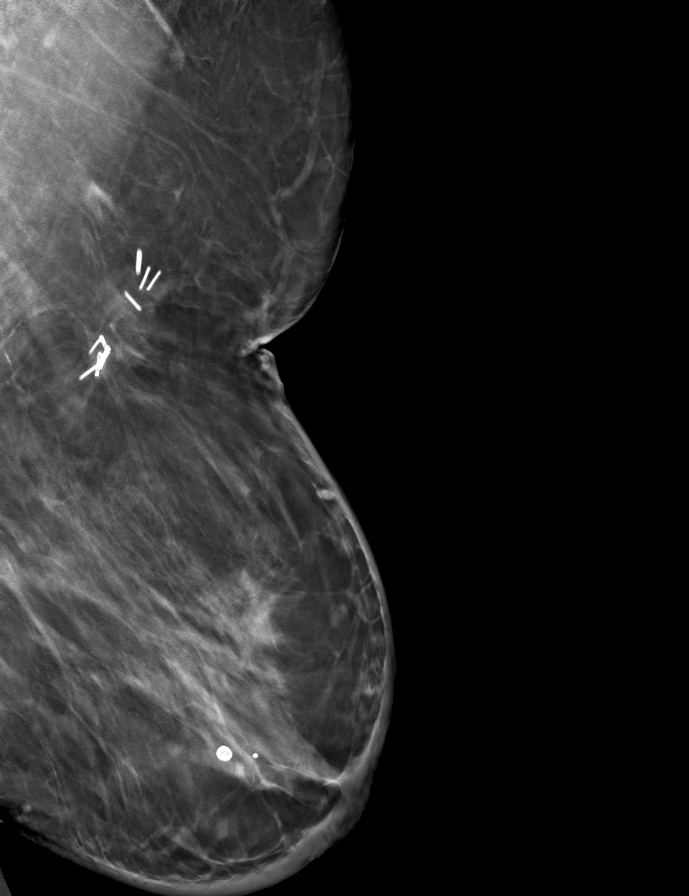

[R CC tomo · tomo slice 31/61.0]
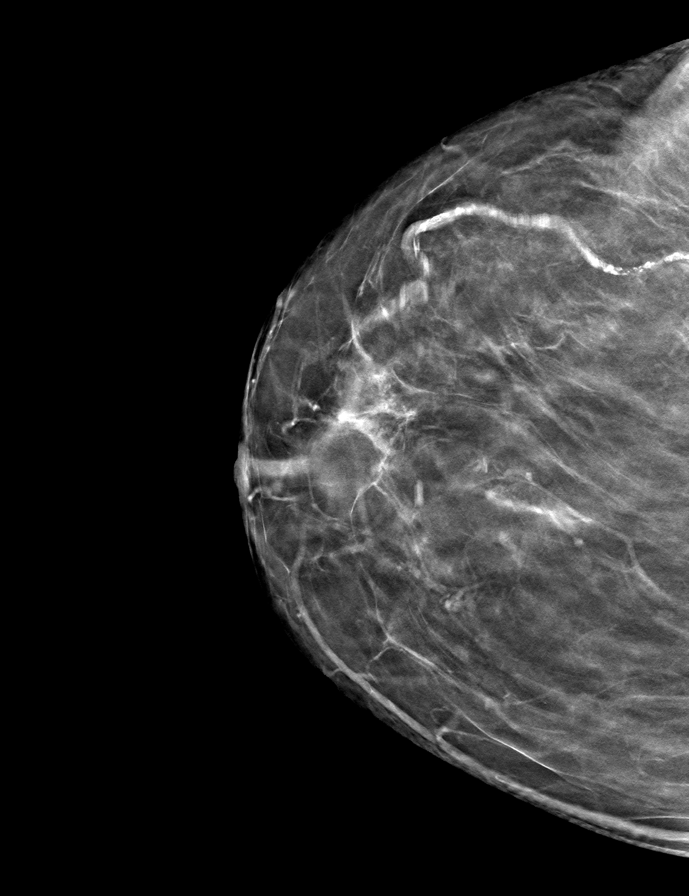

[8 of 24 positions shown; findings below may reference images not displayed]

ACR Breast Density Category c: The breast tissue is heterogeneously
dense, which may obscure small masses.
FINDINGS: There is interval shrinking of the left breast. There is increased
parenchymal density centrally within the left breast. Additionally,
there is mild increased skin thickening about the anterior and
middle depth of the left breast. No suspicious abnormality within
the right breast.

Mammographic images were processed with CAD.

On physical exam, there is palpable thickening involving the central
aspect of the left breast.

Targeted ultrasound is performed, showing dense shadowing tissue
within the 12 o'clock position left breast.

Skin thickening is demonstrated within the medial and lateral aspect
of the left breast.
IMPRESSION: 1. Indeterminate interval shrinking of the left breast with
increased central parenchymal density.
2. Skin thickening overlying the left breast.

RECOMMENDATION:
1. Ultrasound-guided core needle biopsy of the shadowing parenchymal
tissue within the central left breast 12 o'clock position to exclude
the possibility of lobular carcinoma.
2. Consider skin punch biopsy of the skin thickening overlying the
left breast.
3. Depending upon pathology results, consider bilateral breast MRI
for further evaluation.

I have discussed the findings and recommendations with the patient.
If applicable, a reminder letter will be sent to the patient
regarding the next appointment.

BI-RADS CATEGORY  4: Suspicious.

## 2020-03-31 DIAGNOSIS — N1832 Chronic kidney disease, stage 3b: Secondary | ICD-10-CM | POA: Diagnosis not present

## 2020-03-31 DIAGNOSIS — E1129 Type 2 diabetes mellitus with other diabetic kidney complication: Secondary | ICD-10-CM | POA: Diagnosis not present

## 2020-03-31 DIAGNOSIS — E1122 Type 2 diabetes mellitus with diabetic chronic kidney disease: Secondary | ICD-10-CM | POA: Diagnosis not present

## 2020-03-31 DIAGNOSIS — N189 Chronic kidney disease, unspecified: Secondary | ICD-10-CM | POA: Diagnosis not present

## 2020-03-31 DIAGNOSIS — Z23 Encounter for immunization: Secondary | ICD-10-CM | POA: Diagnosis not present

## 2020-03-31 DIAGNOSIS — N2581 Secondary hyperparathyroidism of renal origin: Secondary | ICD-10-CM | POA: Diagnosis not present

## 2020-03-31 DIAGNOSIS — I129 Hypertensive chronic kidney disease with stage 1 through stage 4 chronic kidney disease, or unspecified chronic kidney disease: Secondary | ICD-10-CM | POA: Diagnosis not present

## 2020-03-31 DIAGNOSIS — E785 Hyperlipidemia, unspecified: Secondary | ICD-10-CM | POA: Diagnosis not present

## 2020-05-04 ENCOUNTER — Other Ambulatory Visit: Payer: Self-pay | Admitting: Surgery

## 2020-05-04 DIAGNOSIS — N6320 Unspecified lump in the left breast, unspecified quadrant: Secondary | ICD-10-CM | POA: Diagnosis not present

## 2020-07-20 DIAGNOSIS — H8110 Benign paroxysmal vertigo, unspecified ear: Secondary | ICD-10-CM | POA: Diagnosis not present

## 2020-07-20 DIAGNOSIS — E11319 Type 2 diabetes mellitus with unspecified diabetic retinopathy without macular edema: Secondary | ICD-10-CM | POA: Diagnosis not present

## 2020-07-20 DIAGNOSIS — N1832 Chronic kidney disease, stage 3b: Secondary | ICD-10-CM | POA: Diagnosis not present

## 2020-07-20 DIAGNOSIS — E1122 Type 2 diabetes mellitus with diabetic chronic kidney disease: Secondary | ICD-10-CM | POA: Diagnosis not present

## 2020-07-20 DIAGNOSIS — E1142 Type 2 diabetes mellitus with diabetic polyneuropathy: Secondary | ICD-10-CM | POA: Diagnosis not present

## 2020-07-20 DIAGNOSIS — Z7984 Long term (current) use of oral hypoglycemic drugs: Secondary | ICD-10-CM | POA: Diagnosis not present

## 2020-07-31 ENCOUNTER — Other Ambulatory Visit: Payer: Self-pay | Admitting: Surgery

## 2020-07-31 DIAGNOSIS — N6459 Other signs and symptoms in breast: Secondary | ICD-10-CM

## 2020-09-10 DIAGNOSIS — H402231 Chronic angle-closure glaucoma, bilateral, mild stage: Secondary | ICD-10-CM | POA: Diagnosis not present

## 2020-09-10 DIAGNOSIS — N1832 Chronic kidney disease, stage 3b: Secondary | ICD-10-CM | POA: Diagnosis not present

## 2020-09-10 DIAGNOSIS — N2581 Secondary hyperparathyroidism of renal origin: Secondary | ICD-10-CM | POA: Diagnosis not present

## 2020-09-10 DIAGNOSIS — E1122 Type 2 diabetes mellitus with diabetic chronic kidney disease: Secondary | ICD-10-CM | POA: Diagnosis not present

## 2020-09-10 DIAGNOSIS — I129 Hypertensive chronic kidney disease with stage 1 through stage 4 chronic kidney disease, or unspecified chronic kidney disease: Secondary | ICD-10-CM | POA: Diagnosis not present

## 2020-10-01 ENCOUNTER — Other Ambulatory Visit: Payer: Self-pay

## 2020-10-01 ENCOUNTER — Ambulatory Visit: Payer: PPO | Attending: Internal Medicine

## 2020-10-01 DIAGNOSIS — Z23 Encounter for immunization: Secondary | ICD-10-CM

## 2020-10-01 NOTE — Progress Notes (Signed)
   Covid-19 Vaccination Clinic  Name:  Zenora Karpel    MRN: [id number] DOB: 02-May-1935  10/01/2020  Ms. Koestner was observed post Covid-19 immunization for 30 minutes based on pre-vaccination screening without incident. She was provided with Vaccine Information Sheet and instruction to access the V-Safe system.   Ms. Avery was instructed to call 911 with any severe reactions post vaccine: [name] Difficulty breathing  . Swelling of face and throat  . A fast heartbeat  . A bad rash all over body  . Dizziness and weakness   Immunizations Administered    Name Date Dose VIS Date Route   PFIZER Comrnaty(Gray TOP) Covid-19 Vaccine 10/01/2020  1:08 PM 0.3 mL 05/28/2020 Intramuscular   Manufacturer: [place]   Lot: [id number]   [place]: [phone]

## 2020-10-05 ENCOUNTER — Other Ambulatory Visit (HOSPITAL_BASED_OUTPATIENT_CLINIC_OR_DEPARTMENT_OTHER): Payer: Self-pay

## 2020-10-05 MED ORDER — COVID-19 MRNA VACCINE (PFIZER) 30 MCG/0.3ML IM SUSP
INTRAMUSCULAR | 0 refills | Status: DC
  Filled 2020-10-05: qty 0.3, 1d supply, fill #0

## 2020-10-13 ENCOUNTER — Ambulatory Visit: Payer: PPO

## 2020-10-13 ENCOUNTER — Other Ambulatory Visit: Payer: Self-pay

## 2020-10-13 ENCOUNTER — Ambulatory Visit
Admission: RE | Admit: 2020-10-13 | Discharge: 2020-10-13 | Disposition: A | Discharge status: Home / Self Care | Payer: PPO | Source: Ambulatory Visit | Attending: Surgery | Admitting: Surgery

## 2020-10-13 DIAGNOSIS — Z853 Personal history of malignant neoplasm of breast: Secondary | ICD-10-CM | POA: Diagnosis not present

## 2020-10-13 DIAGNOSIS — N6459 Other signs and symptoms in breast: Secondary | ICD-10-CM

## 2020-10-13 DIAGNOSIS — N6320 Unspecified lump in the left breast, unspecified quadrant: Secondary | ICD-10-CM | POA: Diagnosis not present

## 2020-10-20 ENCOUNTER — Encounter: Payer: Self-pay | Admitting: Oncology

## 2020-10-27 ENCOUNTER — Telehealth: Payer: Self-pay

## 2020-10-27 ENCOUNTER — Telehealth: Payer: Self-pay | Admitting: Oncology

## 2020-10-27 NOTE — Telephone Encounter (Signed)
Cancelled appt per 5/10 sch msg. Pt said she would call back to r/s.

## 2020-10-27 NOTE — Telephone Encounter (Signed)
Pt called stating she needed to cancel appt with Dr Jana Hakim 5/12 d/t "issues at home". This LPN offered to r/s pt, and she states she will call us back to r/s when "things get better."

## 2020-10-29 ENCOUNTER — Inpatient Hospital Stay: Payer: PPO | Admitting: Oncology

## 2020-12-07 DIAGNOSIS — E1122 Type 2 diabetes mellitus with diabetic chronic kidney disease: Secondary | ICD-10-CM | POA: Diagnosis not present

## 2020-12-07 DIAGNOSIS — Z7984 Long term (current) use of oral hypoglycemic drugs: Secondary | ICD-10-CM | POA: Diagnosis not present

## 2020-12-07 DIAGNOSIS — N1832 Chronic kidney disease, stage 3b: Secondary | ICD-10-CM | POA: Diagnosis not present

## 2020-12-07 DIAGNOSIS — E11319 Type 2 diabetes mellitus with unspecified diabetic retinopathy without macular edema: Secondary | ICD-10-CM | POA: Diagnosis not present

## 2020-12-07 DIAGNOSIS — E1142 Type 2 diabetes mellitus with diabetic polyneuropathy: Secondary | ICD-10-CM | POA: Diagnosis not present

## 2020-12-07 DIAGNOSIS — L600 Ingrowing nail: Secondary | ICD-10-CM | POA: Diagnosis not present

## 2020-12-08 ENCOUNTER — Other Ambulatory Visit: Payer: Self-pay | Admitting: Internal Medicine

## 2020-12-08 DIAGNOSIS — Z1231 Encounter for screening mammogram for malignant neoplasm of breast: Secondary | ICD-10-CM

## 2021-01-11 ENCOUNTER — Ambulatory Visit: Payer: PPO | Admitting: Podiatry

## 2021-01-11 ENCOUNTER — Other Ambulatory Visit: Payer: Self-pay

## 2021-01-11 DIAGNOSIS — B351 Tinea unguium: Secondary | ICD-10-CM | POA: Diagnosis not present

## 2021-01-11 DIAGNOSIS — M79675 Pain in left toe(s): Secondary | ICD-10-CM

## 2021-01-11 DIAGNOSIS — M79674 Pain in right toe(s): Secondary | ICD-10-CM

## 2021-01-11 DIAGNOSIS — E1159 Type 2 diabetes mellitus with other circulatory complications: Secondary | ICD-10-CM | POA: Diagnosis not present

## 2021-01-11 NOTE — Progress Notes (Signed)
This patient presents to the office with chief complaint of long thick nails and diabetic feet.  This patient  says there  is  no pain and discomfort in her feet.  This patient says there are long thick big  nails.  These nails are painful walking and wearing shoes.  Patient has history of  ingrown toenail treated by Dr.  Buddy Duty three weeks ago.  Patient is unable to  self treat his own nails . This patient presents  to the office today for treatment of the  long nails and a foot evaluation due to history of  diabetes.  General Appearance  Alert, conversant and in no acute stress.  Vascular  Dorsalis pedis and posterior tibial  pulses are  not palpable due to swelling bilaterally.  Capillary return is within normal limits  bilaterally. Temperature is within normal limits  bilaterally.  Neurologic  Senn-Weinstein monofilament wire test within normal limits  bilaterally. Muscle power within normal limits bilaterally.  Nails Thick disfigured discolored nails with subungual debris  hallux nails bilaterally. No evidence of bacterial infection or drainage bilaterally.  Orthopedic  No limitations of motion of motion feet .  No crepitus or effusions noted.  No bony pathology or digital deformities noted.  Skin  normotropic skin with no porokeratosis noted bilaterally.  No signs of infections or ulcers noted.     Onychomycosis  Diabetes with no foot complications  IE  Debride nails with nail nipper followed by dremel tool usage.  A diabetic foot exam was performed and there is no evidence of any vascular or neurologic pathology.   RTC 3 months.   Gardiner Barefoot DPM

## 2021-01-19 ENCOUNTER — Encounter: Payer: Self-pay | Admitting: Podiatry

## 2021-01-27 ENCOUNTER — Telehealth: Payer: Self-pay | Admitting: Podiatry

## 2021-01-27 NOTE — Telephone Encounter (Signed)
Patient is requesting a detailed response regarding the MyChart messages she sent 8/2. A call back was attempted but she was unable to answer. Patient stated to avoid "phone tag" please respond via [place].

## 2021-01-27 NOTE — Telephone Encounter (Signed)
Called this patient on 09/27/20 to talk with her about her calls.  I sent this to nurse to answer and she must not have received the message.  Once I saw the new message on Wednesday, I immediately called her.  I told her that the thick nail diagnosis of onychomycosis is used on all thick nails. I gave her my opinion that her nail would not respond to topical medication.  Therefore I used the term nail dystrophy.  I apologized that it took so long to respond.  I chose to personally call her to answer her questions.  Gardiner Barefoot DPM

## 2021-03-10 NOTE — Telephone Encounter (Signed)
Please advise 

## 2021-03-17 ENCOUNTER — Other Ambulatory Visit (HOSPITAL_BASED_OUTPATIENT_CLINIC_OR_DEPARTMENT_OTHER): Payer: Self-pay

## 2021-03-17 ENCOUNTER — Ambulatory Visit: Payer: PPO | Attending: Internal Medicine

## 2021-03-17 DIAGNOSIS — Z1211 Encounter for screening for malignant neoplasm of colon: Secondary | ICD-10-CM | POA: Diagnosis not present

## 2021-03-17 DIAGNOSIS — Z23 Encounter for immunization: Secondary | ICD-10-CM

## 2021-03-17 MED ORDER — PFIZER COVID-19 VAC BIVALENT 30 MCG/0.3ML IM SUSP
INTRAMUSCULAR | 0 refills | Status: DC
  Filled 2021-03-17: qty 0.3, 1d supply, fill #0

## 2021-03-17 NOTE — Progress Notes (Addendum)
   Covid-19 Vaccination Clinic  Name:  Brittney Wright    MRN: [id number] DOB: 1935/02/22  03/17/2021  Ms. Sleep was observed post Covid-19 immunization for 30 minutes based on pre-vaccination screening without incident. She was provided with Vaccine Information Sheet and instruction to access the V-Safe system.   [name] was instructed to call 911 with any severe reactions post vaccine: Difficulty breathing  Swelling of face and throat  A fast heartbeat  A bad rash all over body  Dizziness and weakness

## 2021-03-24 ENCOUNTER — Other Ambulatory Visit: Payer: Self-pay | Admitting: Internal Medicine

## 2021-03-24 DIAGNOSIS — N6489 Other specified disorders of breast: Secondary | ICD-10-CM

## 2021-03-26 ENCOUNTER — Other Ambulatory Visit: Payer: Self-pay | Admitting: Internal Medicine

## 2021-03-26 ENCOUNTER — Other Ambulatory Visit: Payer: Self-pay | Admitting: Adult Health

## 2021-03-26 ENCOUNTER — Other Ambulatory Visit: Payer: Self-pay | Admitting: Oncology

## 2021-03-26 DIAGNOSIS — N6459 Other signs and symptoms in breast: Secondary | ICD-10-CM

## 2021-03-29 ENCOUNTER — Ambulatory Visit
Admission: RE | Admit: 2021-03-29 | Discharge: 2021-03-29 | Disposition: A | Discharge status: Home / Self Care | Payer: PPO | Source: Ambulatory Visit | Attending: Internal Medicine | Admitting: Internal Medicine

## 2021-03-29 ENCOUNTER — Other Ambulatory Visit: Payer: Self-pay

## 2021-03-29 DIAGNOSIS — N6459 Other signs and symptoms in breast: Secondary | ICD-10-CM

## 2021-03-29 DIAGNOSIS — R922 Inconclusive mammogram: Secondary | ICD-10-CM | POA: Diagnosis not present

## 2021-04-13 DIAGNOSIS — Z23 Encounter for immunization: Secondary | ICD-10-CM | POA: Diagnosis not present

## 2021-04-13 DIAGNOSIS — E1122 Type 2 diabetes mellitus with diabetic chronic kidney disease: Secondary | ICD-10-CM | POA: Diagnosis not present

## 2021-04-13 DIAGNOSIS — N1832 Chronic kidney disease, stage 3b: Secondary | ICD-10-CM | POA: Diagnosis not present

## 2021-04-13 DIAGNOSIS — N2581 Secondary hyperparathyroidism of renal origin: Secondary | ICD-10-CM | POA: Diagnosis not present

## 2021-04-13 DIAGNOSIS — I129 Hypertensive chronic kidney disease with stage 1 through stage 4 chronic kidney disease, or unspecified chronic kidney disease: Secondary | ICD-10-CM | POA: Diagnosis not present

## 2021-04-19 ENCOUNTER — Ambulatory Visit: Payer: PPO | Admitting: Podiatry

## 2021-04-19 ENCOUNTER — Encounter: Payer: Self-pay | Admitting: Podiatry

## 2021-04-19 ENCOUNTER — Other Ambulatory Visit: Payer: Self-pay

## 2021-04-19 DIAGNOSIS — M79675 Pain in left toe(s): Secondary | ICD-10-CM | POA: Diagnosis not present

## 2021-04-19 DIAGNOSIS — B351 Tinea unguium: Secondary | ICD-10-CM | POA: Diagnosis not present

## 2021-04-19 DIAGNOSIS — M79674 Pain in right toe(s): Secondary | ICD-10-CM

## 2021-04-19 DIAGNOSIS — E1159 Type 2 diabetes mellitus with other circulatory complications: Secondary | ICD-10-CM

## 2021-04-20 DIAGNOSIS — H02056 Trichiasis without entropian left eye, unspecified eyelid: Secondary | ICD-10-CM | POA: Diagnosis not present

## 2021-04-20 DIAGNOSIS — H35372 Puckering of macula, left eye: Secondary | ICD-10-CM | POA: Diagnosis not present

## 2021-04-20 DIAGNOSIS — H402231 Chronic angle-closure glaucoma, bilateral, mild stage: Secondary | ICD-10-CM | POA: Diagnosis not present

## 2021-04-20 DIAGNOSIS — E119 Type 2 diabetes mellitus without complications: Secondary | ICD-10-CM | POA: Diagnosis not present

## 2021-04-20 DIAGNOSIS — H0102A Squamous blepharitis right eye, upper and lower eyelids: Secondary | ICD-10-CM | POA: Diagnosis not present

## 2021-06-09 ENCOUNTER — Other Ambulatory Visit: Payer: Self-pay

## 2021-06-09 ENCOUNTER — Ambulatory Visit (HOSPITAL_COMMUNITY)
Admission: EM | Admit: 2021-06-09 | Discharge: 2021-06-09 | Disposition: A | Discharge status: Home / Self Care | Payer: PPO | Attending: Urgent Care | Admitting: Urgent Care

## 2021-06-09 ENCOUNTER — Encounter (HOSPITAL_COMMUNITY): Payer: Self-pay | Admitting: Emergency Medicine

## 2021-06-09 DIAGNOSIS — S91031A Puncture wound without foreign body, right ankle, initial encounter: Secondary | ICD-10-CM | POA: Diagnosis not present

## 2021-06-09 DIAGNOSIS — Z23 Encounter for immunization: Secondary | ICD-10-CM | POA: Diagnosis not present

## 2021-06-09 DIAGNOSIS — S91039A Puncture wound without foreign body, unspecified ankle, initial encounter: Secondary | ICD-10-CM

## 2021-06-09 MED ORDER — TETANUS-DIPHTHERIA TOXOIDS TD 5-2 LFU IM INJ: 0.5000 mL | INJECTION | Freq: Once | INTRAMUSCULAR | Status: DC

## 2021-06-09 MED ORDER — LIDOCAINE-EPINEPHRINE 1 %-1:100000 IJ SOLN
INTRAMUSCULAR | Status: AC
  Filled 2021-06-09: qty 1

## 2021-06-09 MED ORDER — TETANUS-DIPHTH-ACELL PERTUSSIS 5-2.5-18.5 LF-MCG/0.5 IM SUSY
0.5000 mL | PREFILLED_SYRINGE | Freq: Once | INTRAMUSCULAR | Status: AC
  Administered 2021-06-09: 16:00:00 0.5 mL via INTRAMUSCULAR

## 2021-06-09 MED ORDER — TETANUS-DIPHTH-ACELL PERTUSSIS 5-2.5-18.5 LF-MCG/0.5 IM SUSY
PREFILLED_SYRINGE | INTRAMUSCULAR | Status: AC
  Filled 2021-06-09: qty 0.5

## 2021-06-09 NOTE — Discharge Instructions (Addendum)
Keep pressure dressing on until bed tonight. Remove only coban and non-stick pad this evening. Do not take baby aspirin for 3 days. Elevate foot while at home. Allow steri-strips to fall off, usually in 3-4 days Avoid excessive walking or pressure on leg.

## 2021-06-09 NOTE — ED Notes (Signed)
Patient dropped a glass cup that broke, reports a piece of glass cut right ankle, medial.  Patient does have a small laceration or possibly a puncture wound to right ankle, medial.  Applied pressure dressing.  Gave patient instructions on what issues to notify staff about:swelling, numbness, tingling, and repeatedly told patient the provider would need to see her.  Patient returned to lobby and right foot elevated

## 2021-06-09 NOTE — ED Triage Notes (Signed)
See prior note

## 2021-06-09 NOTE — ED Provider Notes (Signed)
[facility]    CSN: [id number] Arrival date & time: 06/09/21  1257      History   Chief Complaint Chief Complaint  Patient presents with   Laceration    HPI Brittney Wright is a 86 y.o. female.   86 year old female presents today due to continued bleeding on her right medial foot.  She states she dropped a coffee mug this morning at home, and a tiny chunk of it bounced off the floor and hit her in the medial ankle.  Patient has lymphedema and varicosities, and unfortunately this fragment cut a small varicosity.  She states that due to her edema, she has trouble reaching her foot.  She went down some paper towels and tried to stop the bleeding, but was unable to apply pressure at home.  Patient does live alone.  She is not on blood thinners, but does take a baby aspirin daily.  She is uncertain when her last tetanus vaccination was performed.  She has full range of motion of her toes foot and ankle, and denies any erythema.   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   Cataract    Depression    Diabetes mellitus    History of radiation therapy 03/28/12- 04/26/12   left breast 4250 cGy 17 sessions, left breast boost 750 cGy 3 sessions   Hot flashes    Hypertension    Personal history of radiation therapy    SOB (shortness of breath) on exertion     Patient Active Problem List   Diagnosis Date Noted   Pain due to onychomycosis of toenails of both feet 01/11/2021   Type 2 diabetes mellitus with vascular disease ([place]) 01/11/2021   Breast cancer of upper-outer quadrant of left female breast ([place]) 01/13/2012    Past Surgical History:  Procedure Laterality Date   BREAST BIOPSY     BREAST LUMPECTOMY Left 2013   BREAST SURGERY  1976   rt br bx   CATARACT EXTRACTION Bilateral    08/2011 and 05/2011   COLONOSCOPY     DILATION AND CURETTAGE OF UTERUS     EYE SURGERY  12,13   cataracts-both     OB History   No obstetric history on file.      Home Medications    Prior to Admission medications   Medication Sig Start Date End Date Taking? Authorizing Provider  ACCU-CHEK AVIVA PLUS test strip 1 each by Other route as needed.  02/02/12   [provider]  atorvastatin (LIPITOR) 80 MG tablet Take 80 mg by mouth daily.    [provider]  BABY ASPIRIN PO Take 81 mg by mouth daily. 01/07/14   [provider]  Betamethasone Valerate 0.12 % foam Apply 1 application topically as needed.  03/23/12   [provider]  carvedilol (COREG) 6.25 MG tablet Take 6.25 mg by mouth 2 (two) times daily with a meal.    [provider]  Cholecalciferol (VITAMIN D-400 PO) Take 1 tablet by mouth daily. 01/07/14   [provider]  Ciclopirox 1 % shampoo Apply 1 application topically at bedtime. 12/04/19   Lavonna Monarch, MD  COVID-19 mRNA bivalent vaccine, Pfizer, (PFIZER COVID-19 VAC BIVALENT) injection Inject into the muscle. 03/17/21   Carlyle Basques, MD  COVID-19 mRNA vaccine, Pfizer, 30 MCG/0.3ML injection Inject into the muscle. 10/01/20   Carlyle Basques, MD  glipiZIDE Faylene Kurtz)  10 MG tablet Take 10 mg by mouth daily.    [provider]  Lancets (ACCU-CHEK MULTICLIX) lancets 1 each by Other route as needed.  02/02/12   [provider]  latanoprost (XALATAN) 0.005 % ophthalmic solution Place 1 drop into both eyes at bedtime.  01/08/13   [provider]  pioglitazone (ACTOS) 15 MG tablet Take 15 mg by mouth daily. 10/15/19   [provider]  torsemide (DEMADEX) 20 MG tablet  08/23/19   [provider]    Family History Family History  Problem Relation Age of Onset   Heart attack Mother    Stroke Mother    Heart attack Father    Hypertension Sister    Heart Problems Paternal Grandfather    Breast cancer Paternal Aunt     Social History Social History   Tobacco Use   Smoking status: Former     Types: Cigarettes    Quit date: 06/21/1991    Years since quitting: 29.9   Smokeless tobacco: Never  Vaping Use   Vaping Use: Never used  Substance Use Topics   Alcohol use: Yes    Comment: Rarely   Drug use: No     Allergies   Minocycline   Review of Systems Review of Systems As per hpi  Physical Exam Triage Vital Signs ED Triage Vitals  Enc Vitals Group     BP 06/09/21 1414 (!) 144/70     Pulse Rate 06/09/21 1414 71     Resp 06/09/21 1414 20     Temp 06/09/21 1414 98.2 F (36.8 C)     Temp Source 06/09/21 1414 Oral     SpO2 06/09/21 1414 96 %     Weight --      Height --      Head Circumference --      Peak Flow --      Pain Score 06/09/21 1412 0     Pain Loc --      Pain Edu? --      Excl. in [place]? --    No data found.  Updated Vital Signs BP (!) 144/70 (BP Location: Left Arm) Comment (BP Location): large cuff   Pulse 71    Temp 98.2 F (36.8 C) (Oral)    Resp 20    SpO2 96%   Visual Acuity Right Eye Distance:   Left Eye Distance:   Bilateral Distance:    Right Eye Near:   Left Eye Near:    Bilateral Near:     Physical Exam Vitals and nursing note reviewed.  Constitutional:      General: She is not in acute distress.    Appearance: She is well-developed. She is obese.  HENT:     Head: Normocephalic and atraumatic.  Eyes:     Conjunctiva/sclera: Conjunctivae normal.  Cardiovascular:     Rate and Rhythm: Normal rate.     Heart sounds: No murmur heard. Pulmonary:     Effort: Pulmonary effort is normal. No respiratory distress.  Musculoskeletal:        General: Swelling (chronic lymphedema) and signs of injury (very small puncture wound to R medial ankle, small amount of bleeding noted from site over superficial varicosity. No FB) present. No tenderness or deformity.     Cervical back: Neck supple.  Skin:    General: Skin is warm and dry.     Capillary Refill: Capillary refill takes less than 2 seconds.     Coloration: Skin is not  jaundiced.      Findings: No erythema or rash.  Neurological:     Mental Status: She is alert.  Psychiatric:        Mood and Affect: Mood normal.     UC Treatments / Results  Labs (all labs ordered are listed, but only abnormal results are displayed) Labs Reviewed - No data to display  EKG   Radiology No results found.  Procedures Wound Care  Date/Time: 06/09/2021 3:13 PM Performed by: Chaney Malling, PA Authorized by: Chaney Malling, PA   Comments:     Site of bleeding cleansed with alcohol prep pad and betadine solution. 1cc of 1% lidocaine with epinephrine administered at the site of bleed with resolution of the bleeding. Steri-strips placed over puncture site. Non-stick telfa pad and coban used to apply a pressure dressing to site. Tolerated with no adverse reactions. (including critical care time)  Medications Ordered in UC Medications  tetanus & diphtheria toxoids (adult) (TENIVAC) injection 0.5 mL (has no administration in time range)    Initial Impression / Assessment and Plan / UC Course  I have reviewed the triage vital signs and the nursing notes.  Pertinent labs & imaging results that were available during my care of the patient were reviewed by me and considered in my medical decision making (see chart for details).     Puncture wound with bleed of varicosity - bleeding stopped in office by combination of pressure dressing, 1cc lido with epinephrine, and steri-strips. Td administered today. Wound care instructions provided.  Final Clinical Impressions(s) / UC Diagnoses   Final diagnoses:  Puncture wound of ankle with complication, initial encounter     Discharge Instructions      Keep pressure dressing on until bed tonight. Remove only coban and non-stick pad this evening. Do not take baby aspirin for 3 days. Elevate foot while at home. Allow steri-strips to fall off, usually in 3-4 days Avoid excessive walking or pressure on leg.     ED Prescriptions    None    PDMP not reviewed this encounter.   Chaney Malling, [place] 06/09/21 [phone]

## 2021-06-10 ENCOUNTER — Telehealth: Payer: Self-pay | Admitting: *Deleted

## 2021-06-10 ENCOUNTER — Ambulatory Visit (INDEPENDENT_AMBULATORY_CARE_PROVIDER_SITE_OTHER): Payer: PPO

## 2021-06-10 ENCOUNTER — Ambulatory Visit: Payer: PPO | Admitting: Podiatry

## 2021-06-10 DIAGNOSIS — S91011A Laceration without foreign body, right ankle, initial encounter: Secondary | ICD-10-CM

## 2021-06-10 MED ORDER — CEPHALEXIN 500 MG PO CAPS: 500.0000 mg | ORAL_CAPSULE | Freq: Three times a day (TID) | ORAL | 0 refills | Status: AC

## 2021-06-10 NOTE — Patient Instructions (Addendum)
Leave the dressing on until 2PM Saturday 12/24  At that time you can take the dressing off and shower  If there is any drainage still, re-apply a gauze dressing or bandaid and the ACE wrap  Take the antibiotics for 5 days    If it has not healed within 2 weeks please call and let me know and I will evaluate it

## 2021-06-10 NOTE — Telephone Encounter (Signed)
Patient is calling because she injured the foot and it won't stop slow stream bleeding, went to Urgent Care 1 day ago(notes in epic) they applied lidocaine and a pressure bandage. Should she be seen in office for a sooner  f/u appointment?Please advise.

## 2021-06-10 NOTE — Telephone Encounter (Signed)
I went ahead and scheduled the patient for a urgent work in with Dr. Sherryle Lis today since the patient stated that she is still bleeding.

## 2021-06-11 NOTE — Progress Notes (Addendum)
°  Subjective:  Patient ID: Brittney Wright, female    DOB: 06-May-1935,  MRN: [id number]  Chief Complaint  Patient presents with   Diabetic Ulcer     URGENT WORK IN-right foot wound bleeding    86 y.o. female presents with the above complaint. History confirmed with patient.  She dropped a coffee cup at home which shattered and lacerated her ankle.  She went to urgent care and the ER and had quite a long wait, they evaluated it and put Steri-Strips over the laceration and a compression dressing.  She says she changed the dressing last night and it kept bleeding.  She put the dressing back on and is still is bleeding today and she is concerned because she has diabetes.  Objective:  Physical Exam: warm, good capillary refill, no trophic changes or ulcerative lesions, normal DP and PT pulses, normal sensory exam, and she has varicose veins with mild pitting edema, no cellulitis or signs of infection, 1.5 cm laceration on the medial ankle minimal active bleeding noted some ecchymosis around this   Radiographs: Multiple views x-ray of right ankle: No foreign body noted Assessment:   1. Laceration of right ankle, initial encounter      Plan:  Patient was evaluated and treated and all questions answered.  I inspected the laceration and appears to be small and the bleeding is reduced quite a bit.  I think she likely remove the dressing too early which caused her to continue to bleed.  X-rays were negative for foreign body retained.  I did place her on Keflex as a precaution with the holiday weekend coming up, she is very anxious about the possibility of an infection in her diabetes and this should give her some peace of mind, currently there are no active signs of infection.  A new compression dressing with Surgifoam, gauze, ABD pad cast padding and a compression Ace wrap was applied.  I instructed her to leave this on for 48 hours and then she may change it.  She will return to see [place] as needed  at this point.  I discussed with her signs and symptoms of infection or if it continues to bleed even beyond this.  Discussed the need to go back to urgent care or ER over the weekend if this happens.  She understands.  Return if symptoms worsen or fail to improve.

## 2021-06-15 ENCOUNTER — Telehealth: Payer: Self-pay | Admitting: Podiatry

## 2021-06-15 ENCOUNTER — Encounter: Payer: Self-pay | Admitting: Podiatry

## 2021-06-15 NOTE — Telephone Encounter (Signed)
Pt called stating her office notes from 12/22 were incorrect. She states the urgent care did give her instructions. She is requesting you to fix her note, as well as give her a call so she can discuss further treatment.

## 2021-06-15 NOTE — Telephone Encounter (Signed)
Pt called stating she believes she had an allergic reaction from the antibiotic. She states she experienced tingling in her lips and flushing throughout her body. She would like to know if it's ok to continue taking or if she needs an alternative. Please advise

## 2021-06-15 NOTE — Telephone Encounter (Signed)
No, she should stop taking it.  At this point I do not think it would be necessary to be on 1, she is more likely to have reactions to further antibiotics than she is an infection

## 2021-06-23 NOTE — Telephone Encounter (Signed)
Spoke with patient. She is aware of the changes made. Thank you

## 2021-07-21 ENCOUNTER — Ambulatory Visit: Payer: PPO | Admitting: Podiatry

## 2021-10-21 ENCOUNTER — Other Ambulatory Visit: Payer: Self-pay

## 2021-10-21 ENCOUNTER — Emergency Department (HOSPITAL_BASED_OUTPATIENT_CLINIC_OR_DEPARTMENT_OTHER)
Admission: EM | Admit: 2021-10-21 | Discharge: 2021-10-22 | Disposition: A | Discharge status: Home / Self Care | Payer: PPO | Attending: Emergency Medicine | Admitting: Emergency Medicine

## 2021-10-21 ENCOUNTER — Encounter (HOSPITAL_BASED_OUTPATIENT_CLINIC_OR_DEPARTMENT_OTHER): Payer: Self-pay

## 2021-10-21 DIAGNOSIS — H1131 Conjunctival hemorrhage, right eye: Secondary | ICD-10-CM

## 2021-10-21 DIAGNOSIS — Z7982 Long term (current) use of aspirin: Secondary | ICD-10-CM | POA: Insufficient documentation

## 2021-10-21 DIAGNOSIS — H5789 Other specified disorders of eye and adnexa: Secondary | ICD-10-CM | POA: Diagnosis present

## 2021-10-21 DIAGNOSIS — E119 Type 2 diabetes mellitus without complications: Secondary | ICD-10-CM | POA: Insufficient documentation

## 2021-10-21 DIAGNOSIS — Z853 Personal history of malignant neoplasm of breast: Secondary | ICD-10-CM | POA: Insufficient documentation

## 2021-10-21 NOTE — ED Provider Notes (Signed)
?[facility] EMERGENCY DEPT ?Provider Note ? ? ?CSN: [id number] ?Arrival date & time: 10/21/21  2056 ? ?  ? ?History ? ?Chief Complaint  ?Patient presents with  ? Eye Problem  ? ? ?Brittney Wright is a 87 y.o. female. ? ?Patient is an 87 year old female with past medical history of diabetes and distant history of breast cancer.  Patient presenting today with complaints of redness to her right eye.  She stepped out of the shower this evening and looked in the mirror.  She noticed that the white part of her eye was red.  She denies any injury or trauma.  She denies any visual disturbances.  She denies experiencing any pain.  She denies taking any blood thinners.  She has been on aspirin in the past but stopped this back in December after an injury. ? ?The history is provided by the patient.  ? ?  ? ?Home Medications ?Prior to Admission medications   ?Medication Sig Start Date End Date Taking? Authorizing Provider  ?ACCU-CHEK AVIVA PLUS test strip 1 each by Other route as needed.  02/02/12   [provider]  ?atorvastatin (LIPITOR) 80 MG tablet Take 80 mg by mouth daily.    [provider]  ?BABY ASPIRIN PO Take 81 mg by mouth daily. 01/07/14   [provider]  ?Betamethasone Valerate 0.12 % foam Apply 1 application topically as needed.  03/23/12   [provider]  ?carvedilol (COREG) 6.25 MG tablet Take 6.25 mg by mouth 2 (two) times daily with a meal.    [provider]  ?Cholecalciferol (VITAMIN D-400 PO) Take 1 tablet by mouth daily. 01/07/14   [provider]  ?Ciclopirox 1 % shampoo Apply 1 application topically at bedtime. 12/04/19   Lavonna Monarch, MD  ?COVID-19 mRNA bivalent vaccine, Pfizer, (PFIZER COVID-19 VAC BIVALENT) injection Inject into the muscle. 03/17/21   Carlyle Basques, MD  ?COVID-19 mRNA vaccine, Pfizer, 30 MCG/0.3ML injection Inject into the muscle. 10/01/20   Carlyle Basques, MD  ?glipiZIDE (GLUCOTROL) 10 MG tablet Take 10 mg by mouth  daily.    [provider]  ?Lancets (ACCU-CHEK MULTICLIX) lancets 1 each by Other route as needed.  02/02/12   [provider]  ?latanoprost (XALATAN) 0.005 % ophthalmic solution Place 1 drop into both eyes at bedtime.  01/08/13   [provider]  ?pioglitazone (ACTOS) 15 MG tablet Take 15 mg by mouth daily. 10/15/19   [provider]  ?torsemide (DEMADEX) 20 MG tablet  08/23/19   [provider]  ?   ? ?Allergies    ?Minocycline and Cephalexin   ? ?Review of Systems   ?Review of Systems  ?All other systems reviewed and are negative. ? ?Physical Exam ?Updated Vital Signs ?BP (!) 158/75 (BP Location: Left Arm)   Pulse 73   Temp 98.2 ?F (36.8 ?C)   Resp 16   Ht 5' 4.5" (1.638 m)   Wt 99.8 kg   SpO2 100%   BMI 37.18 kg/m?  ?Physical Exam ?Vitals and nursing note reviewed.  ?Constitutional:   ?   Appearance: Normal appearance.  ?HENT:  ?   Head: Normocephalic and atraumatic.  ?Eyes:  ?   Comments: There is a moderate-sized subconjunctival hematoma noted to the right eye.  This is noted to be circumferential to the pupil.  Pupil is reactive and anterior chamber is otherwise clear.  ?Pulmonary:  ?   Effort: Pulmonary effort is normal.  ?Skin: ?   General: Skin is  warm and dry.  ?Neurological:  ?   Mental Status: She is alert.  ? ? ?ED Results / Procedures / Treatments   ?Labs ?(all labs ordered are listed, but only abnormal results are displayed) ?Labs Reviewed - No data to display ? ?EKG ?None ? ?Radiology ?No results found. ? ?Procedures ?Procedures  ? ? ?Medications Ordered in ED ?Medications - No data to display ? ?ED Course/ Medical Decision Making/ A&P ? ?Patient presenting with a spontaneous subconjunctival hemorrhage.  Eye exam is otherwise unremarkable.  Patient not on any blood thinners.  I feel as though she can safely be discharged with as needed return.  No specific intervention necessary. ? ?Final Clinical Impression(s) / ED Diagnoses ?Final diagnoses:  ?None   ? ? ?Rx / DC Orders ?ED Discharge Orders   ? ? None  ? ?  ? ? ?  ?Veryl Speak, MD ?10/21/21 2352 ? ?

## 2021-10-21 NOTE — ED Triage Notes (Signed)
Pt presents POV with redness to right eye. ? ?She noticed this earlier this evening when getting out of shower. ? ?She denies any injury to her eye. ? ?Denies pain to eye. ? ?Pt ambulatory to triage, in NAD, GCS 15.  ?

## 2021-10-21 NOTE — Discharge Instructions (Signed)
Return to the emergency department if you experience any changes in vision, severe eye pain, or for other new and concerning symptoms. ?

## 2021-10-25 DIAGNOSIS — H402231 Chronic angle-closure glaucoma, bilateral, mild stage: Secondary | ICD-10-CM | POA: Diagnosis not present

## 2021-10-26 DIAGNOSIS — N1832 Chronic kidney disease, stage 3b: Secondary | ICD-10-CM | POA: Diagnosis not present

## 2021-10-26 DIAGNOSIS — R600 Localized edema: Secondary | ICD-10-CM | POA: Diagnosis not present

## 2021-10-26 DIAGNOSIS — I129 Hypertensive chronic kidney disease with stage 1 through stage 4 chronic kidney disease, or unspecified chronic kidney disease: Secondary | ICD-10-CM | POA: Diagnosis not present

## 2021-10-26 DIAGNOSIS — E1129 Type 2 diabetes mellitus with other diabetic kidney complication: Secondary | ICD-10-CM | POA: Diagnosis not present

## 2021-10-26 DIAGNOSIS — E1122 Type 2 diabetes mellitus with diabetic chronic kidney disease: Secondary | ICD-10-CM | POA: Diagnosis not present

## 2021-10-26 DIAGNOSIS — N2581 Secondary hyperparathyroidism of renal origin: Secondary | ICD-10-CM | POA: Diagnosis not present

## 2021-10-26 DIAGNOSIS — R29898 Other symptoms and signs involving the musculoskeletal system: Secondary | ICD-10-CM | POA: Diagnosis not present

## 2021-10-26 DIAGNOSIS — E785 Hyperlipidemia, unspecified: Secondary | ICD-10-CM | POA: Diagnosis not present

## 2022-01-12 DIAGNOSIS — F3341 Major depressive disorder, recurrent, in partial remission: Secondary | ICD-10-CM | POA: Diagnosis not present

## 2022-01-12 DIAGNOSIS — L989 Disorder of the skin and subcutaneous tissue, unspecified: Secondary | ICD-10-CM | POA: Diagnosis not present

## 2022-01-12 DIAGNOSIS — M170 Bilateral primary osteoarthritis of knee: Secondary | ICD-10-CM | POA: Diagnosis not present

## 2022-01-12 DIAGNOSIS — E1121 Type 2 diabetes mellitus with diabetic nephropathy: Secondary | ICD-10-CM | POA: Diagnosis not present

## 2022-01-12 DIAGNOSIS — Z800 Family history of malignant neoplasm of digestive organs: Secondary | ICD-10-CM | POA: Diagnosis not present

## 2022-01-12 DIAGNOSIS — R262 Difficulty in walking, not elsewhere classified: Secondary | ICD-10-CM | POA: Diagnosis not present

## 2022-01-14 ENCOUNTER — Ambulatory Visit
Admission: RE | Admit: 2022-01-14 | Discharge: 2022-01-14 | Disposition: A | Discharge status: Home / Self Care | Payer: PPO | Source: Ambulatory Visit | Attending: Internal Medicine | Admitting: Internal Medicine

## 2022-01-14 ENCOUNTER — Other Ambulatory Visit: Payer: Self-pay | Admitting: Internal Medicine

## 2022-01-14 DIAGNOSIS — S99922A Unspecified injury of left foot, initial encounter: Secondary | ICD-10-CM

## 2022-01-14 DIAGNOSIS — M25572 Pain in left ankle and joints of left foot: Secondary | ICD-10-CM | POA: Diagnosis not present

## 2022-01-14 DIAGNOSIS — M79675 Pain in left toe(s): Secondary | ICD-10-CM | POA: Diagnosis not present

## 2022-01-14 DIAGNOSIS — M7989 Other specified soft tissue disorders: Secondary | ICD-10-CM | POA: Diagnosis not present

## 2022-01-24 DIAGNOSIS — M170 Bilateral primary osteoarthritis of knee: Secondary | ICD-10-CM | POA: Diagnosis not present

## 2022-01-24 DIAGNOSIS — Z7984 Long term (current) use of oral hypoglycemic drugs: Secondary | ICD-10-CM | POA: Diagnosis not present

## 2022-01-24 DIAGNOSIS — E113299 Type 2 diabetes mellitus with mild nonproliferative diabetic retinopathy without macular edema, unspecified eye: Secondary | ICD-10-CM | POA: Diagnosis not present

## 2022-01-24 DIAGNOSIS — E1140 Type 2 diabetes mellitus with diabetic neuropathy, unspecified: Secondary | ICD-10-CM | POA: Diagnosis not present

## 2022-01-24 DIAGNOSIS — L989 Disorder of the skin and subcutaneous tissue, unspecified: Secondary | ICD-10-CM | POA: Diagnosis not present

## 2022-01-24 DIAGNOSIS — N184 Chronic kidney disease, stage 4 (severe): Secondary | ICD-10-CM | POA: Diagnosis not present

## 2022-01-24 DIAGNOSIS — E1121 Type 2 diabetes mellitus with diabetic nephropathy: Secondary | ICD-10-CM | POA: Diagnosis not present

## 2022-01-24 DIAGNOSIS — I129 Hypertensive chronic kidney disease with stage 1 through stage 4 chronic kidney disease, or unspecified chronic kidney disease: Secondary | ICD-10-CM | POA: Diagnosis not present

## 2022-01-24 DIAGNOSIS — H35033 Hypertensive retinopathy, bilateral: Secondary | ICD-10-CM | POA: Diagnosis not present

## 2022-01-26 DIAGNOSIS — D225 Melanocytic nevi of trunk: Secondary | ICD-10-CM | POA: Diagnosis not present

## 2022-01-26 DIAGNOSIS — L821 Other seborrheic keratosis: Secondary | ICD-10-CM | POA: Diagnosis not present

## 2022-01-26 DIAGNOSIS — D692 Other nonthrombocytopenic purpura: Secondary | ICD-10-CM | POA: Diagnosis not present

## 2022-01-31 ENCOUNTER — Ambulatory Visit: Payer: PPO | Admitting: Dermatology

## 2022-02-09 DIAGNOSIS — E1122 Type 2 diabetes mellitus with diabetic chronic kidney disease: Secondary | ICD-10-CM | POA: Diagnosis not present

## 2022-02-09 DIAGNOSIS — R29898 Other symptoms and signs involving the musculoskeletal system: Secondary | ICD-10-CM | POA: Diagnosis not present

## 2022-02-09 DIAGNOSIS — R600 Localized edema: Secondary | ICD-10-CM | POA: Diagnosis not present

## 2022-02-09 DIAGNOSIS — N1832 Chronic kidney disease, stage 3b: Secondary | ICD-10-CM | POA: Diagnosis not present

## 2022-02-09 DIAGNOSIS — N2581 Secondary hyperparathyroidism of renal origin: Secondary | ICD-10-CM | POA: Diagnosis not present

## 2022-02-09 DIAGNOSIS — E785 Hyperlipidemia, unspecified: Secondary | ICD-10-CM | POA: Diagnosis not present

## 2022-02-09 DIAGNOSIS — E1129 Type 2 diabetes mellitus with other diabetic kidney complication: Secondary | ICD-10-CM | POA: Diagnosis not present

## 2022-02-09 DIAGNOSIS — I129 Hypertensive chronic kidney disease with stage 1 through stage 4 chronic kidney disease, or unspecified chronic kidney disease: Secondary | ICD-10-CM | POA: Diagnosis not present

## 2022-02-23 DIAGNOSIS — M170 Bilateral primary osteoarthritis of knee: Secondary | ICD-10-CM | POA: Diagnosis not present

## 2022-02-23 DIAGNOSIS — Z23 Encounter for immunization: Secondary | ICD-10-CM | POA: Diagnosis not present

## 2022-02-23 DIAGNOSIS — N1832 Chronic kidney disease, stage 3b: Secondary | ICD-10-CM | POA: Diagnosis not present

## 2022-02-23 DIAGNOSIS — F3341 Major depressive disorder, recurrent, in partial remission: Secondary | ICD-10-CM | POA: Diagnosis not present

## 2022-02-23 DIAGNOSIS — Z853 Personal history of malignant neoplasm of breast: Secondary | ICD-10-CM | POA: Diagnosis not present

## 2022-02-23 DIAGNOSIS — Z86010 Personal history of colonic polyps: Secondary | ICD-10-CM | POA: Diagnosis not present

## 2022-02-23 DIAGNOSIS — E1142 Type 2 diabetes mellitus with diabetic polyneuropathy: Secondary | ICD-10-CM | POA: Diagnosis not present

## 2022-02-23 DIAGNOSIS — I10 Essential (primary) hypertension: Secondary | ICD-10-CM | POA: Diagnosis not present

## 2022-02-23 DIAGNOSIS — E7800 Pure hypercholesterolemia, unspecified: Secondary | ICD-10-CM | POA: Diagnosis not present

## 2022-02-23 DIAGNOSIS — Z0000 Encounter for general adult medical examination without abnormal findings: Secondary | ICD-10-CM | POA: Diagnosis not present

## 2022-02-23 DIAGNOSIS — E1121 Type 2 diabetes mellitus with diabetic nephropathy: Secondary | ICD-10-CM | POA: Diagnosis not present

## 2022-03-18 ENCOUNTER — Other Ambulatory Visit: Payer: Self-pay | Admitting: Internal Medicine

## 2022-03-18 DIAGNOSIS — N6459 Other signs and symptoms in breast: Secondary | ICD-10-CM

## 2022-04-14 ENCOUNTER — Ambulatory Visit
Admission: RE | Admit: 2022-04-14 | Discharge: 2022-04-14 | Disposition: A | Discharge status: Home / Self Care | Payer: PPO | Source: Ambulatory Visit | Attending: Internal Medicine | Admitting: Internal Medicine

## 2022-04-14 DIAGNOSIS — N6459 Other signs and symptoms in breast: Secondary | ICD-10-CM

## 2022-04-14 DIAGNOSIS — R92323 Mammographic fibroglandular density, bilateral breasts: Secondary | ICD-10-CM | POA: Diagnosis not present

## 2022-04-15 DIAGNOSIS — N1832 Chronic kidney disease, stage 3b: Secondary | ICD-10-CM | POA: Diagnosis not present

## 2022-04-15 DIAGNOSIS — E11319 Type 2 diabetes mellitus with unspecified diabetic retinopathy without macular edema: Secondary | ICD-10-CM | POA: Diagnosis not present

## 2022-04-15 DIAGNOSIS — E1142 Type 2 diabetes mellitus with diabetic polyneuropathy: Secondary | ICD-10-CM | POA: Diagnosis not present

## 2022-04-15 DIAGNOSIS — Z23 Encounter for immunization: Secondary | ICD-10-CM | POA: Diagnosis not present

## 2022-04-15 DIAGNOSIS — E1122 Type 2 diabetes mellitus with diabetic chronic kidney disease: Secondary | ICD-10-CM | POA: Diagnosis not present

## 2022-04-15 DIAGNOSIS — R609 Edema, unspecified: Secondary | ICD-10-CM | POA: Diagnosis not present

## 2022-04-26 DIAGNOSIS — E119 Type 2 diabetes mellitus without complications: Secondary | ICD-10-CM | POA: Diagnosis not present

## 2022-04-26 DIAGNOSIS — H35372 Puckering of macula, left eye: Secondary | ICD-10-CM | POA: Diagnosis not present

## 2022-04-26 DIAGNOSIS — H524 Presbyopia: Secondary | ICD-10-CM | POA: Diagnosis not present

## 2022-04-26 DIAGNOSIS — H02056 Trichiasis without entropian left eye, unspecified eyelid: Secondary | ICD-10-CM | POA: Diagnosis not present

## 2022-04-26 DIAGNOSIS — H402231 Chronic angle-closure glaucoma, bilateral, mild stage: Secondary | ICD-10-CM | POA: Diagnosis not present

## 2022-05-24 DIAGNOSIS — H402231 Chronic angle-closure glaucoma, bilateral, mild stage: Secondary | ICD-10-CM | POA: Diagnosis not present

## 2022-05-25 ENCOUNTER — Ambulatory Visit
Admission: RE | Admit: 2022-05-25 | Discharge: 2022-05-25 | Disposition: A | Discharge status: Home / Self Care | Payer: PPO | Source: Ambulatory Visit | Attending: Internal Medicine | Admitting: Internal Medicine

## 2022-05-25 ENCOUNTER — Other Ambulatory Visit: Payer: Self-pay | Admitting: Internal Medicine

## 2022-05-25 DIAGNOSIS — E1142 Type 2 diabetes mellitus with diabetic polyneuropathy: Secondary | ICD-10-CM | POA: Diagnosis not present

## 2022-05-25 DIAGNOSIS — R0602 Shortness of breath: Secondary | ICD-10-CM | POA: Diagnosis not present

## 2022-05-25 DIAGNOSIS — R609 Edema, unspecified: Secondary | ICD-10-CM | POA: Diagnosis not present

## 2022-05-25 DIAGNOSIS — E11319 Type 2 diabetes mellitus with unspecified diabetic retinopathy without macular edema: Secondary | ICD-10-CM | POA: Diagnosis not present

## 2022-05-25 DIAGNOSIS — N1832 Chronic kidney disease, stage 3b: Secondary | ICD-10-CM | POA: Diagnosis not present

## 2022-05-25 DIAGNOSIS — E1122 Type 2 diabetes mellitus with diabetic chronic kidney disease: Secondary | ICD-10-CM | POA: Diagnosis not present

## 2022-05-25 DIAGNOSIS — R0600 Dyspnea, unspecified: Secondary | ICD-10-CM | POA: Diagnosis not present

## 2022-05-25 DIAGNOSIS — R0789 Other chest pain: Secondary | ICD-10-CM | POA: Diagnosis not present

## 2022-05-30 ENCOUNTER — Other Ambulatory Visit (HOSPITAL_BASED_OUTPATIENT_CLINIC_OR_DEPARTMENT_OTHER): Payer: Self-pay

## 2022-05-30 ENCOUNTER — Encounter: Payer: Self-pay | Admitting: Internal Medicine

## 2022-05-30 ENCOUNTER — Ambulatory Visit: Payer: Medicare Other | Admitting: Internal Medicine

## 2022-05-30 VITALS — BP 135/61 | HR 82 | Ht 65.0 in | Wt 216.2 lb

## 2022-05-30 DIAGNOSIS — I10 Essential (primary) hypertension: Secondary | ICD-10-CM | POA: Diagnosis not present

## 2022-05-30 DIAGNOSIS — E782 Mixed hyperlipidemia: Secondary | ICD-10-CM | POA: Insufficient documentation

## 2022-05-30 DIAGNOSIS — R0602 Shortness of breath: Secondary | ICD-10-CM | POA: Diagnosis not present

## 2022-05-30 MED ORDER — COMIRNATY 30 MCG/0.3ML IM SUSY
PREFILLED_SYRINGE | INTRAMUSCULAR | 0 refills | Status: DC
  Filled 2022-05-30: qty 0.3, 1d supply, fill #0

## 2022-05-30 NOTE — Progress Notes (Signed)
Primary Physician/Referring:  Leeroy Cha, MD  Patient ID: Brittney Wright, female    DOB: 28-Nov-1934, 87 y.o.   MRN: [id number]  Chief Complaint  Patient presents with  . Shortness of Breath  . New Patient (Initial Visit)   HPI:    Brittney Wright  is a 87 y.o. ***  Past Medical History:  Diagnosis Date  . Back pain   . 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 Cataract   . Depression   . Diabetes mellitus   . History of radiation therapy 03/28/12- 04/26/12   left breast 4250 cGy 17 sessions, left breast boost 750 cGy 3 sessions  . Hot flashes   . Hypertension   . Personal history of radiation therapy   . SOB (shortness of breath) on exertion    Past Surgical History:  Procedure Laterality Date  . BREAST BIOPSY    . BREAST LUMPECTOMY Left 2013  . BREAST SURGERY  1976   rt br bx  . CATARACT EXTRACTION Bilateral    08/2011 and 05/2011  . COLONOSCOPY    . DILATION AND CURETTAGE OF UTERUS    . EYE SURGERY  12,13   cataracts-both   Family History  Problem Relation Age of Onset  . Heart attack Mother   . Stroke Mother   . Heart attack Father   . Hypertension Sister   . Heart Problems Paternal Grandfather   . Breast cancer Paternal Aunt     Social History   Tobacco Use  . Smoking status: Former    Types: Cigarettes    Quit date: 06/21/1991    Years since quitting: 30.9  . Smokeless tobacco: Never  Substance Use Topics  . Alcohol use: Yes    Comment: Rarely   Marital Status: Divorced  ROS  ***ROS Objective  Blood pressure 135/61, pulse 82, height '5\' 5"'$  (1.651 m), weight 216 lb 3.2 oz (98.1 kg), SpO2 100 %. Body mass index is 35.98 kg/m.     05/30/2022    1:38 PM 10/22/2021   12:01 AM 10/21/2021    9:16 PM  Vitals with BMI  Height '5\' 5"'$     Weight 216 lbs 3 oz    BMI 21.30    Systolic 865 784 696  Diastolic 61 76 75  Pulse 82 73 73     ***Physical Exam  Medications and allergies   Allergies  Allergen Reactions  .  Minocycline     Tingling face/tongue, flushing  . Cephalexin Rash    Flushing of face and tingling in lips     Medication list after today's encounter   Current Outpatient Medications:  .  ACCU-CHEK AVIVA PLUS test strip, 1 each by Other route as needed. , Disp: , Rfl:  .  atorvastatin (LIPITOR) 80 MG tablet, Take 80 mg by mouth daily., Disp: , Rfl:  .  carvedilol (COREG) 6.25 MG tablet, Take 6.25 mg by mouth 2 (two) times daily with a meal., Disp: , Rfl:  .  Cholecalciferol (VITAMIN D-400 PO), Take 1 tablet by mouth daily., Disp: , Rfl:  .  COVID-19 mRNA bivalent vaccine, Pfizer, (PFIZER COVID-19 VAC BIVALENT) injection, Inject into the muscle., Disp: 0.3 mL, Rfl: 0 .  COVID-19 mRNA vaccine, Pfizer, 30 MCG/0.3ML injection, Inject into the muscle., Disp: 0.3 mL, Rfl: 0 .  latanoprost (XALATAN) 0.005 % ophthalmic solution, Place 1 drop into both eyes at bedtime. , Disp: , Rfl:  .  timolol (TIMOPTIC) 0.5 % ophthalmic  solution, Place 1 drop into the left eye every morning., Disp: , Rfl:  .  torsemide (DEMADEX) 20 MG tablet, Take 20 mg by mouth 2 (two) times daily., Disp: , Rfl:  .  Betamethasone Valerate 0.12 % foam, Apply 1 application topically as needed. , Disp: , Rfl:  .  Ciclopirox 1 % shampoo, Apply 1 application topically at bedtime., Disp: 360 mL, Rfl: 2 .  Lancets (ACCU-CHEK MULTICLIX) lancets, 1 each by Other route as needed. , Disp: , Rfl:   Laboratory examination:   Lab Results  Component Value Date   NA 139 06/18/2012   K 4.9 06/18/2012   CO2 25 06/18/2012   GLUCOSE 171 (H) 06/18/2012   BUN 30.0 (H) 06/18/2012   CREATININE 1.7 (H) 06/18/2012   CALCIUM 8.9 06/18/2012   GFRNONAA 32 (L) 01/31/2012       Latest Ref Rng & Units 06/18/2012   10:39 AM 01/31/2012   10:30 AM 01/18/2012    8:46 AM  CMP  Glucose 70 - 99 mg/dl 171  230  290   BUN 7.0 - 26.0 mg/dL 30.0  36  20   Creatinine 0.6 - 1.1 mg/dL 1.7  1.54  1.68   Sodium 136 - 145 mEq/L 139  137  134   Potassium 3.5  - 5.1 mEq/L 4.9  4.9  4.1   Chloride 98 - 107 mEq/L 107  103  100   CO2 22 - 29 mEq/L '25  24  24   '$ Calcium 8.4 - 10.4 mg/dL 8.9  9.5  8.9   Total Protein 6.4 - 8.3 g/dL 7.0   7.4   Total Bilirubin 0.20 - 1.20 mg/dL 0.30   0.3   Alkaline Phos 40 - 150 U/L 51   59   AST 5 - 34 U/L 17   22   ALT 0 - 55 U/L 11   21       Latest Ref Rng & Units 06/18/2012   10:39 AM 03/01/2012    9:47 AM 02/03/2012   10:43 AM  CBC  WBC 3.9 - 10.3 10e3/uL 4.8  7.4    Hemoglobin 11.6 - 15.9 g/dL 11.3  11.5  10.0   Hematocrit 34.8 - 46.6 % 34.4  36.1    Platelets 145 - 400 10e3/uL 231  273      Lipid Panel No results for input(s): "CHOL", "TRIG", "LDLCALC", "VLDL", "HDL", "CHOLHDL", "LDLDIRECT" in the last 8760 hours.  HEMOGLOBIN A1C No results found for: "HGBA1C", "MPG" TSH No results for input(s): "TSH" in the last 8760 hours.  External labs:   ***  Radiology:    Cardiac Studies:   No results found for this or any previous visit from the past 1095 days.     No results found for this or any previous visit from the past 1095 days.   ***  EKG:   ***  ***  Assessment     ICD-10-CM   1. SOB (shortness of breath)  R06.02 EKG 12-Lead    2. Essential hypertension  I10     3. Mixed hyperlipidemia  E78.2        Orders Placed This Encounter  Procedures  . EKG 12-Lead    No orders of the defined types were placed in this encounter.   Medications Discontinued During This Encounter  Medication Reason  . BABY ASPIRIN PO Patient Preference  . glipiZIDE (GLUCOTROL) 10 MG tablet   . pioglitazone (ACTOS) 15 MG tablet  Recommendations:   Brittney Wright is a 87 y.o.  ***    Collene Mares [place], DO, [facility]  05/30/2022, 1:55 PM Office: [phone] Pager: [phone]

## 2022-06-07 ENCOUNTER — Other Ambulatory Visit: Payer: Medicare Other

## 2022-06-07 DIAGNOSIS — N1832 Chronic kidney disease, stage 3b: Secondary | ICD-10-CM | POA: Diagnosis not present

## 2022-06-07 DIAGNOSIS — R600 Localized edema: Secondary | ICD-10-CM | POA: Diagnosis not present

## 2022-06-07 DIAGNOSIS — E1122 Type 2 diabetes mellitus with diabetic chronic kidney disease: Secondary | ICD-10-CM | POA: Diagnosis not present

## 2022-06-14 DIAGNOSIS — E1142 Type 2 diabetes mellitus with diabetic polyneuropathy: Secondary | ICD-10-CM | POA: Diagnosis not present

## 2022-06-14 DIAGNOSIS — N1832 Chronic kidney disease, stage 3b: Secondary | ICD-10-CM | POA: Diagnosis not present

## 2022-06-14 DIAGNOSIS — E1122 Type 2 diabetes mellitus with diabetic chronic kidney disease: Secondary | ICD-10-CM | POA: Diagnosis not present

## 2022-06-14 DIAGNOSIS — Z1211 Encounter for screening for malignant neoplasm of colon: Secondary | ICD-10-CM | POA: Diagnosis not present

## 2022-06-14 DIAGNOSIS — E11319 Type 2 diabetes mellitus with unspecified diabetic retinopathy without macular edema: Secondary | ICD-10-CM | POA: Diagnosis not present

## 2022-06-14 DIAGNOSIS — R609 Edema, unspecified: Secondary | ICD-10-CM | POA: Diagnosis not present

## 2022-06-22 ENCOUNTER — Ambulatory Visit: Payer: Medicare Other | Admitting: Internal Medicine

## 2022-06-22 ENCOUNTER — Encounter: Payer: Self-pay | Admitting: Internal Medicine

## 2022-06-22 ENCOUNTER — Ambulatory Visit: Payer: Medicare Other

## 2022-06-22 VITALS — BP 147/70 | HR 72 | Ht 65.0 in | Wt 207.8 lb

## 2022-06-22 DIAGNOSIS — R0602 Shortness of breath: Secondary | ICD-10-CM

## 2022-06-22 DIAGNOSIS — I10 Essential (primary) hypertension: Secondary | ICD-10-CM | POA: Diagnosis not present

## 2022-06-22 DIAGNOSIS — E782 Mixed hyperlipidemia: Secondary | ICD-10-CM

## 2022-06-22 MED ORDER — LOSARTAN POTASSIUM 25 MG PO TABS
12.5000 mg | ORAL_TABLET | Freq: Every day | ORAL | 3 refills | Status: DC
  Filled 2022-09-20: qty 100, 200d supply, fill #0
  Filled 2022-09-21: qty 50, 100d supply, fill #0
  Filled 2023-01-19: qty 50, 100d supply, fill #1
  Filled 2023-05-04: qty 50, 100d supply, fill #2

## 2022-06-22 NOTE — Progress Notes (Signed)
Primary Physician/Referring:  Leeroy Cha, MD  Patient ID: Brittney Wright, female    DOB: Sep 05, 1934, 88 y.o.   MRN: [id number]  Chief Complaint  Patient presents with   Shortness of Breath   Follow-up   Results   HPI:    Brittney Wright  is a 88 y.o. female with past medical history significant for hypertension, hyperlipidemia, and diabetes who is here for a follow-up visit. She has been doing well since the last time she was here. Patient just had her echocardiogram done. Her leg swelling has significantly improved since I last saw her. She denies chest pain, palpitations, diaphoresis, syncope, claudication.  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   Cataract    Depression    Diabetes mellitus    History of radiation therapy 03/28/12- 04/26/12   left breast 4250 cGy 17 sessions, left breast boost 750 cGy 3 sessions   Hot flashes    Hypertension    Personal history of radiation therapy    SOB (shortness of breath) on exertion    Past Surgical History:  Procedure Laterality Date   BREAST BIOPSY     BREAST LUMPECTOMY Left 2013   BREAST SURGERY  1976   rt br bx   CATARACT EXTRACTION Bilateral    08/2011 and 05/2011   COLONOSCOPY     DILATION AND CURETTAGE OF UTERUS     EYE SURGERY  12,13   cataracts-both   Family History  Problem Relation Age of Onset   Heart attack Mother    Stroke Mother    Heart attack Father    Hypertension Sister    Heart Problems Paternal Grandfather    Breast cancer Paternal Aunt     Social History   Tobacco Use   Smoking status: Former    Types: Cigarettes    Quit date: 06/21/1991    Years since quitting: 31.0   Smokeless tobacco: Never  Substance Use Topics   Alcohol use: Yes    Comment: Rarely   Marital Status: Divorced  ROS  Review of Systems  Cardiovascular:  Positive for leg swelling. Negative for orthopnea.  Respiratory:  Negative for shortness of  breath.    Objective  Blood pressure (!) 147/70, pulse 72, height '5\' 5"'$  (1.651 m), weight 207 lb 12.8 oz (94.3 kg), SpO2 97 %. Body mass index is 34.58 kg/m.     06/22/2022    2:05 PM 06/22/2022    2:01 PM 05/30/2022    1:38 PM  Vitals with BMI  Height  '5\' 5"'$  '5\' 5"'$   Weight  207 lbs 13 oz 216 lbs 3 oz  BMI  85.02 77.41  Systolic 287 867 672  Diastolic 70 75 61  Pulse 72 75 82     Physical Exam Vitals reviewed.  HENT:     Head: Normocephalic and atraumatic.  Cardiovascular:     Rate and Rhythm: Normal rate and regular rhythm.     Pulses: Normal pulses.     Heart sounds: Normal heart sounds. No murmur heard. Pulmonary:     Effort: Pulmonary effort is normal.     Breath sounds: Normal breath sounds.  Abdominal:     General: Bowel sounds are normal.  Musculoskeletal:     Right lower leg: Edema present.     Left lower leg: Edema present.  Skin:    General: Skin is warm and dry.  Neurological:  Mental Status: She is alert.     Medications and allergies   Allergies  Allergen Reactions   Minocycline     Tingling face/tongue, flushing   Cephalexin Rash    Flushing of face and tingling in lips     Medication list after today's encounter   Current Outpatient Medications:    ACCU-CHEK AVIVA PLUS test strip, 1 each by Other route as needed. , Disp: , Rfl:    atorvastatin (LIPITOR) 80 MG tablet, Take 80 mg by mouth daily., Disp: , Rfl:    Betamethasone Valerate 0.12 % foam, Apply 1 application topically as needed. , Disp: , Rfl:    carvedilol (COREG) 6.25 MG tablet, Take 6.25 mg by mouth 2 (two) times daily with a meal., Disp: , Rfl:    Cholecalciferol (VITAMIN D-400 PO), Take 1 tablet by mouth daily., Disp: , Rfl:    Ciclopirox 1 % shampoo, Apply 1 application topically at bedtime., Disp: 360 mL, Rfl: 2   COVID-19 mRNA bivalent vaccine, Pfizer, (PFIZER COVID-19 VAC BIVALENT) injection, Inject into the muscle., Disp: 0.3 mL, Rfl: 0   COVID-19 mRNA vaccine 2023-2024  (COMIRNATY) syringe, Inject into the muscle., Disp: 0.3 mL, Rfl: 0   COVID-19 mRNA vaccine, Pfizer, 30 MCG/0.3ML injection, Inject into the muscle., Disp: 0.3 mL, Rfl: 0   Lancets (ACCU-CHEK MULTICLIX) lancets, 1 each by Other route as needed. , Disp: , Rfl:    latanoprost (XALATAN) 0.005 % ophthalmic solution, Place 1 drop into both eyes at bedtime. , Disp: , Rfl:    losartan (COZAAR) 25 MG tablet, Take 0.5 tablets (12.5 mg total) by mouth at bedtime., Disp: 100 tablet, Rfl: 3   RYBELSUS 3 MG TABS, Take 1 tablet by mouth daily., Disp: , Rfl:    timolol (TIMOPTIC) 0.5 % ophthalmic solution, Place 1 drop into the left eye every morning., Disp: , Rfl:    torsemide (DEMADEX) 20 MG tablet, Take 20 mg by mouth 2 (two) times daily., Disp: , Rfl:   Laboratory examination:   Lab Results  Component Value Date   NA 139 06/18/2012   K 4.9 06/18/2012   CO2 25 06/18/2012   GLUCOSE 171 (H) 06/18/2012   BUN 30.0 (H) 06/18/2012   CREATININE 1.7 (H) 06/18/2012   CALCIUM 8.9 06/18/2012   GFRNONAA 32 (L) 01/31/2012       Latest Ref Rng & Units 06/18/2012   10:39 AM 01/31/2012   10:30 AM 01/18/2012    8:46 AM  CMP  Glucose 70 - 99 mg/dl 171  230  290   BUN 7.0 - 26.0 mg/dL 30.0  36  20   Creatinine 0.6 - 1.1 mg/dL 1.7  1.54  1.68   Sodium 136 - 145 mEq/L 139  137  134   Potassium 3.5 - 5.1 mEq/L 4.9  4.9  4.1   Chloride 98 - 107 mEq/L 107  103  100   CO2 22 - 29 mEq/L '25  24  24   '$ Calcium 8.4 - 10.4 mg/dL 8.9  9.5  8.9   Total Protein 6.4 - 8.3 g/dL 7.0   7.4   Total Bilirubin 0.20 - 1.20 mg/dL 0.30   0.3   Alkaline Phos 40 - 150 U/L 51   59   AST 5 - 34 U/L 17   22   ALT 0 - 55 U/L 11   21       Latest Ref Rng & Units 06/18/2012   10:39 AM 03/01/2012    9:47  AM 02/03/2012   10:43 AM  CBC  WBC 3.9 - 10.3 10e3/uL 4.8  7.4    Hemoglobin 11.6 - 15.9 g/dL 11.3  11.5  10.0   Hematocrit 34.8 - 46.6 % 34.4  36.1    Platelets 145 - 400 10e3/uL 231  273      Lipid Panel No results for  input(s): "CHOL", "TRIG", "[place]", "VLDL", "HDL", "CHOLHDL", "LDLDIRECT" in the last 8760 hours.  HEMOGLOBIN A1C No results found for: "HGBA1C", "MPG" TSH No results for input(s): "TSH" in the last 8760 hours.  External labs:     Radiology:    Cardiac Studies:     EKG:   05/30/2022: normal sinus rhythm, normal R wave progression, normal axis. No evidence of ischemia  Assessment     ICD-10-CM   1. Mixed hyperlipidemia  E78.2     2. Essential hypertension  I10     3. SOB (shortness of breath)  R06.02        No orders of the defined types were placed in this encounter.   Meds ordered this encounter  Medications   losartan (COZAAR) 25 MG tablet    Sig: Take 0.5 tablets (12.5 mg total) by mouth at bedtime.    Dispense:  100 tablet    Refill:  3    There are no discontinued medications.    Recommendations:   Vendetta Pittinger is a 88 y.o.  female with SOB  SOB (shortness of breath) Suspect diastolic heart failure as the cause of her SOB Patient no longer having orthopnea and leg swelling is improved The recent doubling of her torsemide dose has worked well Echo reviewed and discussed with patient, she has grade I DD   Essential hypertension Continue current cardiac medications. Will add losartan 12.5 mg qHS for additional BP control Encourage low-sodium diet, less than 2000 mg daily.   Mixed hyperlipidemia Continue lipitor  Follow-up in 3 months or sooner if needed     Floydene Flock, DO, [facility]  06/27/2022, 12:39 PM Office: [phone] Pager: [phone]

## 2022-07-01 ENCOUNTER — Encounter: Payer: Self-pay | Admitting: Podiatry

## 2022-07-01 ENCOUNTER — Ambulatory Visit: Payer: PPO | Admitting: Podiatry

## 2022-07-01 DIAGNOSIS — M79675 Pain in left toe(s): Secondary | ICD-10-CM

## 2022-07-01 DIAGNOSIS — B351 Tinea unguium: Secondary | ICD-10-CM | POA: Diagnosis not present

## 2022-07-01 DIAGNOSIS — M79674 Pain in right toe(s): Secondary | ICD-10-CM | POA: Diagnosis not present

## 2022-07-01 DIAGNOSIS — E1159 Type 2 diabetes mellitus with other circulatory complications: Secondary | ICD-10-CM | POA: Diagnosis not present

## 2022-07-01 DIAGNOSIS — L603 Nail dystrophy: Secondary | ICD-10-CM | POA: Insufficient documentation

## 2022-07-01 NOTE — Progress Notes (Signed)
This patient returns to my office for at risk foot care.  This patient requires this care by a professional since this patient will be at risk due to having diabetes.  This patient is unable to cut nails herself since the patient cannot reach her nails.These nails are painful walking and wearing shoes.  This patient presents for at risk foot care today.  General Appearance  Alert, conversant and in no acute stress.  Vascular  Dorsalis pedis and posterior tibial  pulses are palpable  bilaterally.  Capillary return is within normal limits  bilaterally. Temperature is within normal limits  bilaterally.  Neurologic  Senn-Weinstein monofilament wire test within normal limits  bilaterally. Muscle power within normal limits bilaterally.  Nails Thick disfigured discolored nails with subungual debris  hallux nails  B/L.  No evidence of bacterial infection or drainage bilaterally.  Orthopedic  No limitations of motion  feet .  No crepitus or effusions noted.  No bony pathology or digital deformities noted.  Skin  normotropic skin with no porokeratosis noted bilaterally.  No signs of infections or ulcers noted.     Onychomycosis  Pain in right toes  Pain in left toes  Consent was obtained for treatment procedures.   Mechanical debridement of nails 1-5  bilaterally performed with a nail nipper.  Filed with dremel without incident.    Return office visit    prn                 Told patient to return for periodic foot care and evaluation due to potential at risk complications.   Gardiner Barefoot DPM

## 2022-07-18 ENCOUNTER — Other Ambulatory Visit (HOSPITAL_COMMUNITY): Payer: Self-pay

## 2022-07-19 ENCOUNTER — Other Ambulatory Visit (HOSPITAL_COMMUNITY): Payer: Self-pay

## 2022-07-19 DIAGNOSIS — N1832 Chronic kidney disease, stage 3b: Secondary | ICD-10-CM | POA: Diagnosis not present

## 2022-07-19 DIAGNOSIS — E1122 Type 2 diabetes mellitus with diabetic chronic kidney disease: Secondary | ICD-10-CM | POA: Diagnosis not present

## 2022-07-19 DIAGNOSIS — E11319 Type 2 diabetes mellitus with unspecified diabetic retinopathy without macular edema: Secondary | ICD-10-CM | POA: Diagnosis not present

## 2022-07-19 DIAGNOSIS — E1142 Type 2 diabetes mellitus with diabetic polyneuropathy: Secondary | ICD-10-CM | POA: Diagnosis not present

## 2022-07-19 DIAGNOSIS — R609 Edema, unspecified: Secondary | ICD-10-CM | POA: Diagnosis not present

## 2022-07-19 MED ORDER — DAPAGLIFLOZIN PROPANEDIOL 10 MG PO TABS
10.0000 mg | ORAL_TABLET | Freq: Every day | ORAL | 4 refills | Status: DC
  Filled 2022-07-19 – 2022-07-20 (×3): qty 100, 100d supply, fill #0

## 2022-07-20 ENCOUNTER — Other Ambulatory Visit: Payer: Self-pay

## 2022-07-20 ENCOUNTER — Other Ambulatory Visit (HOSPITAL_COMMUNITY): Payer: Self-pay

## 2022-07-21 ENCOUNTER — Other Ambulatory Visit (HOSPITAL_COMMUNITY): Payer: Self-pay

## 2022-07-28 ENCOUNTER — Other Ambulatory Visit (HOSPITAL_COMMUNITY): Payer: Self-pay

## 2022-07-29 ENCOUNTER — Other Ambulatory Visit (HOSPITAL_COMMUNITY): Payer: Self-pay

## 2022-07-29 MED ORDER — TIMOLOL MALEATE 0.5 % OP SOLN
1.0000 [drp] | Freq: Every morning | OPHTHALMIC | 2 refills | Status: DC
  Filled 2022-07-29: qty 5, 100d supply, fill #0
  Filled 2022-08-30: qty 5, 90d supply, fill #0

## 2022-07-29 MED ORDER — PIOGLITAZONE HCL 15 MG PO TABS
15.0000 mg | ORAL_TABLET | Freq: Every day | ORAL | 2 refills | Status: DC
  Filled 2022-07-29: qty 90, 90d supply, fill #0

## 2022-07-29 MED ORDER — ATORVASTATIN CALCIUM 80 MG PO TABS
80.0000 mg | ORAL_TABLET | Freq: Every day | ORAL | 1 refills | Status: DC
  Filled 2022-07-29 – 2022-10-03 (×2): qty 90, 90d supply, fill #0
  Filled 2023-01-19: qty 90, 90d supply, fill #1

## 2022-07-29 MED ORDER — ONETOUCH DELICA PLUS LANCET33G MISC
1.0000 | Freq: Every day | 2 refills | Status: AC
  Filled 2022-07-29: qty 100, 100d supply, fill #0
  Filled 2022-07-30: qty 100, 90d supply, fill #0
  Filled 2022-07-30: qty 100, 100d supply, fill #0

## 2022-07-29 MED ORDER — GLUCOSE BLOOD VI STRP
1.0000 | ORAL_STRIP | Freq: Every day | 1 refills | Status: DC
  Filled 2022-07-29 – 2022-10-03 (×2): qty 100, 100d supply, fill #0
  Filled 2022-12-10: qty 100, 100d supply, fill #1

## 2022-07-29 MED ORDER — LATANOPROST 0.005 % OP SOLN
1.0000 [drp] | Freq: Every day | OPHTHALMIC | 4 refills | Status: DC
  Filled 2022-07-29: qty 7.5, 150d supply, fill #0
  Filled 2022-08-30: qty 7.5, 75d supply, fill #0
  Filled 2022-12-10: qty 2.5, 50d supply, fill #0
  Filled 2023-02-03: qty 2.5, 25d supply, fill #1
  Filled 2023-03-09: qty 2.5, 25d supply, fill #2
  Filled 2023-04-12: qty 2.5, 25d supply, fill #3

## 2022-07-29 MED ORDER — GLIPIZIDE 10 MG PO TABS
10.0000 mg | ORAL_TABLET | Freq: Every day | ORAL | 0 refills | Status: DC
  Filled 2022-07-29: qty 90, 90d supply, fill #0

## 2022-07-29 MED ORDER — CARVEDILOL 6.25 MG PO TABS
6.2500 mg | ORAL_TABLET | Freq: Two times a day (BID) | ORAL | 3 refills | Status: DC
  Filled 2022-07-29 – 2022-08-30 (×2): qty 180, 90d supply, fill #0
  Filled 2022-12-19: qty 180, 90d supply, fill #1

## 2022-07-30 ENCOUNTER — Other Ambulatory Visit (HOSPITAL_COMMUNITY): Payer: Self-pay

## 2022-08-01 ENCOUNTER — Other Ambulatory Visit (HOSPITAL_COMMUNITY): Payer: Self-pay

## 2022-08-01 ENCOUNTER — Other Ambulatory Visit: Payer: Self-pay

## 2022-08-02 ENCOUNTER — Other Ambulatory Visit (HOSPITAL_COMMUNITY): Payer: Self-pay

## 2022-08-04 ENCOUNTER — Other Ambulatory Visit (HOSPITAL_COMMUNITY): Payer: Self-pay

## 2022-08-04 ENCOUNTER — Encounter (HOSPITAL_COMMUNITY): Payer: Self-pay

## 2022-08-04 DIAGNOSIS — E1122 Type 2 diabetes mellitus with diabetic chronic kidney disease: Secondary | ICD-10-CM | POA: Diagnosis not present

## 2022-08-04 DIAGNOSIS — E1165 Type 2 diabetes mellitus with hyperglycemia: Secondary | ICD-10-CM | POA: Diagnosis not present

## 2022-08-04 DIAGNOSIS — E11319 Type 2 diabetes mellitus with unspecified diabetic retinopathy without macular edema: Secondary | ICD-10-CM | POA: Diagnosis not present

## 2022-08-04 DIAGNOSIS — R609 Edema, unspecified: Secondary | ICD-10-CM | POA: Diagnosis not present

## 2022-08-04 DIAGNOSIS — E1142 Type 2 diabetes mellitus with diabetic polyneuropathy: Secondary | ICD-10-CM | POA: Diagnosis not present

## 2022-08-04 DIAGNOSIS — N1832 Chronic kidney disease, stage 3b: Secondary | ICD-10-CM | POA: Diagnosis not present

## 2022-08-04 MED ORDER — INSULIN PEN NEEDLE 32G X 4 MM MISC
4 refills | Status: DC
  Filled 2022-08-04: qty 100, 100d supply, fill #0
  Filled 2022-11-08: qty 100, 100d supply, fill #1
  Filled 2023-02-14: qty 100, 100d supply, fill #2
  Filled 2023-05-12: qty 100, 100d supply, fill #3

## 2022-08-04 MED ORDER — TRESIBA FLEXTOUCH 100 UNIT/ML ~~LOC~~ SOPN
10.0000 [IU] | PEN_INJECTOR | Freq: Every day | SUBCUTANEOUS | 4 refills | Status: DC
  Filled 2022-08-04: qty 9, 90d supply, fill #0

## 2022-08-05 ENCOUNTER — Other Ambulatory Visit: Payer: Self-pay

## 2022-08-05 ENCOUNTER — Other Ambulatory Visit (HOSPITAL_COMMUNITY): Payer: Self-pay

## 2022-08-05 MED ORDER — LOTEPREDNOL ETABONATE 0.5 % OP GEL
1.0000 [drp] | Freq: Two times a day (BID) | OPHTHALMIC | 0 refills | Status: DC
  Filled 2022-08-05: qty 5, 30d supply, fill #0

## 2022-08-08 ENCOUNTER — Other Ambulatory Visit (HOSPITAL_COMMUNITY): Payer: Self-pay

## 2022-08-08 DIAGNOSIS — N1832 Chronic kidney disease, stage 3b: Secondary | ICD-10-CM | POA: Diagnosis not present

## 2022-08-09 ENCOUNTER — Other Ambulatory Visit (HOSPITAL_COMMUNITY): Payer: Self-pay

## 2022-08-11 ENCOUNTER — Other Ambulatory Visit (HOSPITAL_COMMUNITY): Payer: Self-pay

## 2022-08-16 DIAGNOSIS — R600 Localized edema: Secondary | ICD-10-CM | POA: Diagnosis not present

## 2022-08-16 DIAGNOSIS — N1832 Chronic kidney disease, stage 3b: Secondary | ICD-10-CM | POA: Diagnosis not present

## 2022-08-16 DIAGNOSIS — E1122 Type 2 diabetes mellitus with diabetic chronic kidney disease: Secondary | ICD-10-CM | POA: Diagnosis not present

## 2022-08-16 DIAGNOSIS — I129 Hypertensive chronic kidney disease with stage 1 through stage 4 chronic kidney disease, or unspecified chronic kidney disease: Secondary | ICD-10-CM | POA: Diagnosis not present

## 2022-08-16 DIAGNOSIS — N2581 Secondary hyperparathyroidism of renal origin: Secondary | ICD-10-CM | POA: Diagnosis not present

## 2022-08-18 ENCOUNTER — Encounter: Payer: Self-pay | Admitting: Internal Medicine

## 2022-08-18 NOTE — Telephone Encounter (Signed)
From patient

## 2022-08-30 ENCOUNTER — Other Ambulatory Visit: Payer: Self-pay

## 2022-09-09 ENCOUNTER — Other Ambulatory Visit (HOSPITAL_COMMUNITY): Payer: Self-pay

## 2022-09-09 DIAGNOSIS — E1165 Type 2 diabetes mellitus with hyperglycemia: Secondary | ICD-10-CM | POA: Diagnosis not present

## 2022-09-09 DIAGNOSIS — E1142 Type 2 diabetes mellitus with diabetic polyneuropathy: Secondary | ICD-10-CM | POA: Diagnosis not present

## 2022-09-09 DIAGNOSIS — E11319 Type 2 diabetes mellitus with unspecified diabetic retinopathy without macular edema: Secondary | ICD-10-CM | POA: Diagnosis not present

## 2022-09-09 DIAGNOSIS — N1832 Chronic kidney disease, stage 3b: Secondary | ICD-10-CM | POA: Diagnosis not present

## 2022-09-09 DIAGNOSIS — R609 Edema, unspecified: Secondary | ICD-10-CM | POA: Diagnosis not present

## 2022-09-09 DIAGNOSIS — E1122 Type 2 diabetes mellitus with diabetic chronic kidney disease: Secondary | ICD-10-CM | POA: Diagnosis not present

## 2022-09-09 MED ORDER — TRESIBA FLEXTOUCH 100 UNIT/ML ~~LOC~~ SOPN
50.0000 [IU] | PEN_INJECTOR | Freq: Every day | SUBCUTANEOUS | 4 refills | Status: DC
  Filled 2022-09-09: qty 30, 60d supply, fill #0
  Filled 2022-09-20: qty 45, 90d supply, fill #0
  Filled 2022-09-21: qty 30, 60d supply, fill #0
  Filled 2022-12-19: qty 30, 60d supply, fill #1
  Filled 2023-02-14: qty 30, 60d supply, fill #2
  Filled 2023-04-23: qty 30, 60d supply, fill #3
  Filled 2023-06-21: qty 30, 60d supply, fill #4

## 2022-09-20 ENCOUNTER — Other Ambulatory Visit: Payer: Self-pay

## 2022-09-21 ENCOUNTER — Encounter: Payer: Self-pay | Admitting: Internal Medicine

## 2022-09-21 ENCOUNTER — Other Ambulatory Visit (HOSPITAL_COMMUNITY): Payer: Self-pay

## 2022-09-21 ENCOUNTER — Ambulatory Visit: Payer: Medicare Other | Admitting: Internal Medicine

## 2022-09-21 VITALS — BP 142/71 | HR 84 | Ht 65.0 in | Wt 197.8 lb

## 2022-09-21 DIAGNOSIS — R0602 Shortness of breath: Secondary | ICD-10-CM | POA: Diagnosis not present

## 2022-09-21 DIAGNOSIS — I10 Essential (primary) hypertension: Secondary | ICD-10-CM

## 2022-09-21 DIAGNOSIS — E782 Mixed hyperlipidemia: Secondary | ICD-10-CM

## 2022-09-21 NOTE — Progress Notes (Signed)
Primary Physician/Referring:  Leeroy Cha, MD  Patient ID: Brittney Wright, female    DOB: 05-27-1935, 88 y.o.   MRN: [id number]  Chief Complaint  Patient presents with  . Hyperlipidemia  . Follow-up   HPI:    Brittney Wright  is a 88 y.o. female with past medical history significant for hypertension, hyperlipidemia, and diabetes who is here for a follow-up visit. She has been doing well since the last time she was here. She has lost about 20 lbs and is feeling so much better. She is not longer having any shortness of breath, not at rest or with exertion. Patient does not have anymore lower extremity swelling. She denies chest pain, palpitations, diaphoresis, syncope, claudication.  Past Medical History:  Diagnosis Date  . Back pain   . Breast cancer 12/2011   Left breast DCIS  . Cataract   . Depression   . Diabetes mellitus   . History of radiation therapy 03/28/12- 04/26/12   left breast 4250 cGy 17 sessions, left breast boost 750 cGy 3 sessions  . Hot flashes   . Hypertension   . Personal history of radiation therapy   . SOB (shortness of breath) on exertion    Past Surgical History:  Procedure Laterality Date  . BREAST BIOPSY    . BREAST LUMPECTOMY Left 2013  . BREAST SURGERY  1976   rt br bx  . CATARACT EXTRACTION Bilateral    08/2011 and 05/2011  . COLONOSCOPY    . DILATION AND CURETTAGE OF UTERUS    . EYE SURGERY  12,13   cataracts-both   Family History  Problem Relation Age of Onset  . Heart attack Mother   . Stroke Mother   . Heart attack Father   . Hypertension Sister   . Heart Problems Paternal Grandfather   . Breast cancer Paternal Aunt     Social History   Tobacco Use  . Smoking status: Former    Types: Cigarettes    Quit date: 06/21/1991    Years since quitting: 31.2  . Smokeless tobacco: Never  Substance Use Topics  . Alcohol use: Yes    Comment: Rarely   Marital Status: Divorced  ROS  Review of Systems  Cardiovascular:   Negative for leg swelling and orthopnea.  Respiratory:  Negative for shortness of breath.    Objective  Blood pressure (!) 142/71, pulse 84, height 5\' 5"  (1.651 m), weight 197 lb 12.8 oz (89.7 kg), SpO2 95 %. Body mass index is 32.92 kg/m.     09/21/2022   10:51 AM 06/22/2022    2:05 PM 06/22/2022    2:01 PM  Vitals with BMI  Height 5\' 5"   5\' 5"   Weight 197 lbs 13 oz  207 lbs 13 oz  BMI [id number]  [id number]  Systolic A999333 [id number] 0000000  Diastolic 71 70 75  Pulse 84 72 75     Physical Exam Vitals reviewed.  HENT:     Head: Normocephalic and atraumatic.  Cardiovascular:     Rate and Rhythm: Normal rate and regular rhythm.     Pulses: Normal pulses.     Heart sounds: Normal heart sounds. No murmur heard. Pulmonary:     Effort: Pulmonary effort is normal.     Breath sounds: Normal breath sounds.  Abdominal:     General: Bowel sounds are normal.  Musculoskeletal:     Right lower leg: No edema.     Left lower leg: No edema.  Skin:  General: Skin is warm and dry.  Neurological:     Mental Status: She is alert.    Medications and allergies   Allergies  Allergen Reactions  . Clobetasol Propionate     Other Reaction(s): break out on face  . Iodinated Contrast Media     Other Reaction(s): Patient has stage 3 CKD  . Minocycline     Tingling face/tongue, flushing  . Nystatin     advicor  . Sulfamethoxazole-Trimethoprim     Other Reaction(s): tingling in lips  . Cephalexin Rash    Flushing of face and tingling in lips     Medication list after today's encounter   Current Outpatient Medications:  .  ACCU-CHEK AVIVA PLUS test strip, 1 each by Other route as needed. , Disp: , Rfl:  .  atorvastatin (LIPITOR) 80 MG tablet, Take 1 tablet (80 mg total) by mouth daily., Disp: 90 tablet, Rfl: 1 .  Betamethasone Valerate 0.12 % foam, Apply 1 application topically as needed. , Disp: , Rfl:  .  carvedilol (COREG) 6.25 MG tablet, Take 1 tablet (6.25 mg total) by mouth 2 (two) times daily., Disp:  180 tablet, Rfl: 3 .  Cholecalciferol (VITAMIN D-400 PO), Take 1 tablet by mouth daily., Disp: , Rfl:  .  Ciclopirox 1 % shampoo, Apply 1 application topically at bedtime., Disp: 360 mL, Rfl: 2 .  dapagliflozin propanediol (FARXIGA) 10 MG TABS tablet, Take 1 tablet (10 mg total) by mouth daily., Disp: 100 tablet, Rfl: 4 .  glucose blood test strip, Use to check blood sugar once daily, Disp: 100 strip, Rfl: 1 .  insulin degludec (TRESIBA FLEXTOUCH) 100 UNIT/ML FlexTouch Pen, Start by injecting 22 units under the skin once daily, then titrate as directed to a maximum daily dose of 50 units (Patient taking differently: 26 Units.), Disp: 30 mL, Rfl: 4 .  Insulin Pen Needle 32G X 4 MM MISC, Use once daily, Disp: 100 each, Rfl: 4 .  Lancets (ACCU-CHEK MULTICLIX) lancets, 1 each by Other route as needed. , Disp: , Rfl:  .  Lancets (ONETOUCH DELICA PLUS 123XX123) MISC, Use to check blood sugar (finger stick) once daily, Disp: 100 each, Rfl: 2 .  latanoprost (XALATAN) 0.005 % ophthalmic solution, Place 1 drop into both eyes at bedtime., Disp: 7.5 mL, Rfl: 4 .  losartan (COZAAR) 25 MG tablet, Take 0.5 tablets (12.5 mg total) by mouth at bedtime., Disp: 100 tablet, Rfl: 3 .  Loteprednol Etabonate (LOTEMAX) 0.5 % GEL, Place 1 drop into both eyes 2 (two) times daily., Disp: 5 g, Rfl: 0 .  timolol (TIMOPTIC) 0.5 % ophthalmic solution, Place 1 drop into the left eye every morning., Disp: 15 mL, Rfl: 2 .  torsemide (DEMADEX) 20 MG tablet, Take 20 mg by mouth daily., Disp: , Rfl:   Laboratory examination:   Lab Results  Component Value Date   NA 139 06/18/2012   K 4.9 06/18/2012   CO2 25 06/18/2012   GLUCOSE 171 (H) 06/18/2012   BUN 30.0 (H) 06/18/2012   CREATININE 1.7 (H) 06/18/2012   CALCIUM 8.9 06/18/2012   GFRNONAA 32 (L) 01/31/2012       Latest Ref Rng & Units 06/18/2012   10:39 AM 01/31/2012   10:30 AM 01/18/2012    8:46 AM  CMP  Glucose 70 - 99 mg/dl 171  230  290   BUN 7.0 - 26.0 mg/dL 30.0   36  20   Creatinine 0.6 - 1.1 mg/dL 1.7  1.54  1.68  Sodium 136 - 145 mEq/L 139  137  134   Potassium 3.5 - 5.1 mEq/L 4.9  4.9  4.1   Chloride 98 - 107 mEq/L 107  103  100   CO2 22 - 29 mEq/L 25  24  24    Calcium 8.4 - 10.4 mg/dL 8.9  9.5  8.9   Total Protein 6.4 - 8.3 g/dL 7.0   7.4   Total Bilirubin 0.20 - 1.20 mg/dL 0.30   0.3   Alkaline Phos 40 - 150 U/L 51   59   AST 5 - 34 U/L 17   22   ALT 0 - 55 U/L 11   21       Latest Ref Rng & Units 06/18/2012   10:39 AM 03/01/2012    9:47 AM 02/03/2012   10:43 AM  CBC  WBC 3.9 - 10.3 10e3/uL 4.8  7.4    Hemoglobin 11.6 - 15.9 g/dL 11.3  11.5  10.0   Hematocrit 34.8 - 46.6 % 34.4  36.1    Platelets 145 - 400 10e3/uL 231  273      Lipid Panel No results for input(s): "CHOL", "TRIG", "[place]", "VLDL", "HDL", "CHOLHDL", "LDLDIRECT" in the last 8760 hours.  HEMOGLOBIN A1C No results found for: "HGBA1C", "MPG" TSH No results for input(s): "TSH" in the last 8760 hours.  External labs:     Radiology:    Cardiac Studies:     EKG:   05/30/2022: normal sinus rhythm, normal R wave progression, normal axis. No evidence of ischemia  Assessment     ICD-10-CM   1. Mixed hyperlipidemia  E78.2     2. Essential hypertension  I10     3. SOB (shortness of breath)  R06.02        No orders of the defined types were placed in this encounter.   No orders of the defined types were placed in this encounter.   Medications Discontinued During This Encounter  Medication Reason  . atorvastatin (LIPITOR) 80 MG tablet Duplicate  . carvedilol (COREG) 123XX123 MG tablet Duplicate  . glipiZIDE (GLUCOTROL) 10 MG tablet Discontinued by provider  . insulin degludec (TRESIBA FLEXTOUCH) 100 UNIT/ML FlexTouch Pen Dose change  . latanoprost (XALATAN) 0.005 % ophthalmic solution Duplicate  . pioglitazone (ACTOS) 15 MG tablet Change in therapy  . RYBELSUS 3 MG TABS Patient Preference  . timolol (TIMOPTIC) 0.5 % ophthalmic solution Duplicate  .  COVID-19 mRNA bivalent vaccine, Pfizer, (PFIZER COVID-19 VAC BIVALENT) injection Completed Course  . COVID-19 mRNA vaccine, Pfizer, 30 MCG/0.3ML injection Completed Course  . COVID-19 mRNA vaccine 2023-2024 (COMIRNATY) syringe Completed Course      Recommendations:   Brittney Wright is a 88 y.o.  female with SOB  SOB (shortness of breath) Suspect diastolic heart failure as the cause of her SOB Patient no longer having orthopnea and leg swelling is improved The recent doubling of her torsemide dose has worked well Echo reviewed and discussed with patient, she has grade I DD   Essential hypertension Continue current cardiac medications. Will add losartan 12.5 mg qHS for additional BP control Encourage low-sodium diet, less than 2000 mg daily.   Mixed hyperlipidemia Continue lipitor  Follow-up in 3 months or sooner if needed     Floydene Flock, DO, [facility]  09/21/2022, 11:41 AM Office: [phone] Pager: [phone]

## 2022-09-22 ENCOUNTER — Other Ambulatory Visit: Payer: Self-pay

## 2022-09-23 ENCOUNTER — Encounter: Payer: Self-pay | Admitting: Internal Medicine

## 2022-09-23 NOTE — Telephone Encounter (Signed)
I have no idea what she is saying I dont do the med rec

## 2022-09-23 NOTE — Telephone Encounter (Signed)
From patient.

## 2022-10-03 ENCOUNTER — Other Ambulatory Visit: Payer: Self-pay

## 2022-10-03 ENCOUNTER — Other Ambulatory Visit (HOSPITAL_COMMUNITY): Payer: Self-pay

## 2022-10-31 ENCOUNTER — Ambulatory Visit: Payer: PPO | Admitting: Podiatry

## 2022-11-03 DIAGNOSIS — H402231 Chronic angle-closure glaucoma, bilateral, mild stage: Secondary | ICD-10-CM | POA: Diagnosis not present

## 2022-11-03 DIAGNOSIS — H02135 Senile ectropion of left lower eyelid: Secondary | ICD-10-CM | POA: Diagnosis not present

## 2022-11-08 ENCOUNTER — Other Ambulatory Visit (HOSPITAL_COMMUNITY): Payer: Self-pay

## 2022-11-10 ENCOUNTER — Other Ambulatory Visit (HOSPITAL_COMMUNITY): Payer: Self-pay

## 2022-11-28 ENCOUNTER — Ambulatory Visit: Payer: PPO | Admitting: Podiatry

## 2022-11-28 DIAGNOSIS — I872 Venous insufficiency (chronic) (peripheral): Secondary | ICD-10-CM | POA: Diagnosis not present

## 2022-11-28 DIAGNOSIS — M79675 Pain in left toe(s): Secondary | ICD-10-CM

## 2022-11-28 DIAGNOSIS — M79674 Pain in right toe(s): Secondary | ICD-10-CM

## 2022-11-28 DIAGNOSIS — B351 Tinea unguium: Secondary | ICD-10-CM

## 2022-11-28 NOTE — Progress Notes (Signed)
Subjective:  Patient ID: Brittney Wright, female    DOB: Jul 17, 1934,  MRN: [id number]   [name] presents to clinic today for:  Chief Complaint  Patient presents with   Diabetes    [facility] pt also states she's worried that her foot might have broken blood vessels on the dorsal of the left foot  . Patient notes nails are thick, discolored, elongated and painful in shoegear when trying to ambulate.  Patient states that she has lost 20 pounds since going off her Actos.  She does take a diuretic.  She acknowledges that she has swelling in her legs but states it is not as bad as before.  She is unable to don compression stockings on her own.  PCP is Lorenda Ishihara, MD.  Allergies  Allergen Reactions   Clobetasol Propionate     Other Reaction(s): break out on face   Iodinated Contrast Media     Other Reaction(s): Patient has stage 3 CKD   Minocycline     Tingling face/tongue, flushing   Nystatin     advicor   Sulfamethoxazole-Trimethoprim     Other Reaction(s): tingling in lips   Cephalexin Rash    Flushing of face and tingling in lips    Review of Systems: Negative except as noted in the HPI.  Objective:  There were no vitals filed for this visit.  Brittney Wright is a pleasant 88 y.o. female in NAD. AAO x 3.  Vascular Examination: Capillary refill time is 3-5 seconds to toes bilateral. Palpable pedal pulses b/l LE. Digital hair present b/l. No pedal edema b/l. Skin temperature gradient WNL b/l. No varicosities b/l. No cyanosis or clubbing noted b/l.   Dermatological Examination: Pedal skin with normal turgor, texture and tone b/l. No open wounds. No interdigital macerations b/l. Toenails x10 are 3mm thick, discolored, dystrophic with subungual debris. There is pain with compression of the nail plates.  They are elongated x10.  There is slight incurvation of the right hallux lateral nail border.  No signs of paronychia are seen.  There is some distal  crumbling of the right hallux nail.  Neurological Examination: Epicritis sensation intact bilateral LE.   Assessment/Plan: 1. Pain due to onychomycosis of toenails of both feet   2. Venous (peripheral) insufficiency     The mycotic toenails were sharply debrided x10 with sterile nail nippers and a power debriding burr to decrease bulk/thickness and length.    Discussed venous insufficiency with the patient today.  She has options of at home leg elevation several times throughout the day above the level of her heart to help control swelling if compression stockings are not an option for her.  Also discussed possible referral for lymphedema therapy.  She could also be sent for venous consultation at a vein clinic.  She will consider these and let us know if she would like any of these referrals at a later date.  Return in about 3 months (around 02/28/2023) for DFC/ nail trim.   Clerance Lav, DPM, FACFAS [facility]     [address] [place] [address]                Office [phone]  Fax [phone]

## 2022-12-09 DIAGNOSIS — E1142 Type 2 diabetes mellitus with diabetic polyneuropathy: Secondary | ICD-10-CM | POA: Diagnosis not present

## 2022-12-09 DIAGNOSIS — R609 Edema, unspecified: Secondary | ICD-10-CM | POA: Diagnosis not present

## 2022-12-09 DIAGNOSIS — E1165 Type 2 diabetes mellitus with hyperglycemia: Secondary | ICD-10-CM | POA: Diagnosis not present

## 2022-12-09 DIAGNOSIS — E11319 Type 2 diabetes mellitus with unspecified diabetic retinopathy without macular edema: Secondary | ICD-10-CM | POA: Diagnosis not present

## 2022-12-09 DIAGNOSIS — N1832 Chronic kidney disease, stage 3b: Secondary | ICD-10-CM | POA: Diagnosis not present

## 2022-12-09 DIAGNOSIS — E1122 Type 2 diabetes mellitus with diabetic chronic kidney disease: Secondary | ICD-10-CM | POA: Diagnosis not present

## 2022-12-10 ENCOUNTER — Other Ambulatory Visit (HOSPITAL_COMMUNITY): Payer: Self-pay

## 2022-12-12 ENCOUNTER — Other Ambulatory Visit: Payer: Self-pay

## 2022-12-13 ENCOUNTER — Other Ambulatory Visit (HOSPITAL_COMMUNITY): Payer: Self-pay

## 2022-12-13 MED ORDER — ONETOUCH VERIO VI STRP
ORAL_STRIP | Freq: Two times a day (BID) | 4 refills | Status: DC
  Filled 2022-12-13: qty 200, 100d supply, fill #0
  Filled 2022-12-13: qty 200, 90d supply, fill #0
  Filled 2022-12-14: qty 100, 50d supply, fill #0
  Filled 2023-02-03: qty 100, 50d supply, fill #1
  Filled 2023-03-27: qty 100, 50d supply, fill #2
  Filled 2023-05-12: qty 100, 50d supply, fill #3
  Filled 2023-06-25: qty 100, 50d supply, fill #4
  Filled 2023-08-16: qty 100, 50d supply, fill #5
  Filled 2023-10-04: qty 100, 50d supply, fill #6
  Filled 2023-11-16: qty 100, 50d supply, fill #7

## 2022-12-14 ENCOUNTER — Other Ambulatory Visit (HOSPITAL_BASED_OUTPATIENT_CLINIC_OR_DEPARTMENT_OTHER): Payer: Self-pay

## 2022-12-14 ENCOUNTER — Other Ambulatory Visit (HOSPITAL_COMMUNITY): Payer: Self-pay

## 2022-12-15 ENCOUNTER — Other Ambulatory Visit: Payer: Self-pay

## 2022-12-20 ENCOUNTER — Other Ambulatory Visit: Payer: Self-pay

## 2022-12-20 ENCOUNTER — Other Ambulatory Visit (HOSPITAL_BASED_OUTPATIENT_CLINIC_OR_DEPARTMENT_OTHER): Payer: Self-pay

## 2022-12-21 ENCOUNTER — Other Ambulatory Visit (HOSPITAL_COMMUNITY): Payer: Self-pay

## 2022-12-21 ENCOUNTER — Other Ambulatory Visit: Payer: Self-pay

## 2022-12-27 ENCOUNTER — Other Ambulatory Visit (HOSPITAL_COMMUNITY): Payer: Self-pay

## 2023-01-09 ENCOUNTER — Other Ambulatory Visit (HOSPITAL_COMMUNITY): Payer: Self-pay

## 2023-01-09 ENCOUNTER — Encounter (HOSPITAL_COMMUNITY): Payer: Self-pay

## 2023-01-09 MED ORDER — AMOXICILLIN 500 MG PO CAPS
500.0000 mg | ORAL_CAPSULE | Freq: Four times a day (QID) | ORAL | 0 refills | Status: DC
  Filled 2023-01-09: qty 28, 7d supply, fill #0

## 2023-01-10 ENCOUNTER — Other Ambulatory Visit (HOSPITAL_COMMUNITY): Payer: Self-pay

## 2023-01-19 ENCOUNTER — Other Ambulatory Visit (HOSPITAL_COMMUNITY): Payer: Self-pay

## 2023-02-04 ENCOUNTER — Other Ambulatory Visit (HOSPITAL_COMMUNITY): Payer: Self-pay

## 2023-02-06 ENCOUNTER — Other Ambulatory Visit: Payer: Self-pay

## 2023-02-13 DIAGNOSIS — N1832 Chronic kidney disease, stage 3b: Secondary | ICD-10-CM | POA: Diagnosis not present

## 2023-02-13 DIAGNOSIS — E1129 Type 2 diabetes mellitus with other diabetic kidney complication: Secondary | ICD-10-CM | POA: Diagnosis not present

## 2023-02-13 DIAGNOSIS — R600 Localized edema: Secondary | ICD-10-CM | POA: Diagnosis not present

## 2023-02-13 DIAGNOSIS — E1122 Type 2 diabetes mellitus with diabetic chronic kidney disease: Secondary | ICD-10-CM | POA: Diagnosis not present

## 2023-02-13 DIAGNOSIS — I129 Hypertensive chronic kidney disease with stage 1 through stage 4 chronic kidney disease, or unspecified chronic kidney disease: Secondary | ICD-10-CM | POA: Diagnosis not present

## 2023-02-13 DIAGNOSIS — N2581 Secondary hyperparathyroidism of renal origin: Secondary | ICD-10-CM | POA: Diagnosis not present

## 2023-02-14 ENCOUNTER — Other Ambulatory Visit (HOSPITAL_COMMUNITY): Payer: Self-pay

## 2023-02-14 MED ORDER — TORSEMIDE 20 MG PO TABS
20.0000 mg | ORAL_TABLET | Freq: Every day | ORAL | 3 refills | Status: DC
  Filled 2023-02-14: qty 90, 45d supply, fill #0
  Filled 2023-03-27: qty 90, 45d supply, fill #1
  Filled 2023-05-12: qty 90, 45d supply, fill #2
  Filled 2023-06-25: qty 90, 45d supply, fill #3

## 2023-02-15 ENCOUNTER — Other Ambulatory Visit (HOSPITAL_COMMUNITY): Payer: Self-pay

## 2023-02-15 ENCOUNTER — Other Ambulatory Visit: Payer: Self-pay

## 2023-03-07 DIAGNOSIS — Z23 Encounter for immunization: Secondary | ICD-10-CM | POA: Diagnosis not present

## 2023-03-07 DIAGNOSIS — E1122 Type 2 diabetes mellitus with diabetic chronic kidney disease: Secondary | ICD-10-CM | POA: Diagnosis not present

## 2023-03-07 DIAGNOSIS — E11319 Type 2 diabetes mellitus with unspecified diabetic retinopathy without macular edema: Secondary | ICD-10-CM | POA: Diagnosis not present

## 2023-03-07 DIAGNOSIS — E1142 Type 2 diabetes mellitus with diabetic polyneuropathy: Secondary | ICD-10-CM | POA: Diagnosis not present

## 2023-03-07 DIAGNOSIS — N1832 Chronic kidney disease, stage 3b: Secondary | ICD-10-CM | POA: Diagnosis not present

## 2023-03-07 DIAGNOSIS — R609 Edema, unspecified: Secondary | ICD-10-CM | POA: Diagnosis not present

## 2023-03-09 ENCOUNTER — Other Ambulatory Visit (HOSPITAL_COMMUNITY): Payer: Self-pay

## 2023-03-24 ENCOUNTER — Ambulatory Visit: Payer: Medicare Other | Admitting: Cardiology

## 2023-03-27 ENCOUNTER — Other Ambulatory Visit (HOSPITAL_COMMUNITY): Payer: Self-pay

## 2023-03-27 ENCOUNTER — Other Ambulatory Visit: Payer: Self-pay

## 2023-03-27 MED ORDER — CARVEDILOL 6.25 MG PO TABS
6.2500 mg | ORAL_TABLET | Freq: Two times a day (BID) | ORAL | 4 refills | Status: DC
  Filled 2023-03-27: qty 180, 90d supply, fill #0
  Filled 2023-06-21: qty 180, 90d supply, fill #1
  Filled 2023-09-25: qty 180, 90d supply, fill #2
  Filled 2023-12-17: qty 180, 90d supply, fill #3
  Filled 2024-03-19: qty 180, 90d supply, fill #4

## 2023-03-28 ENCOUNTER — Other Ambulatory Visit: Payer: Self-pay

## 2023-04-03 ENCOUNTER — Ambulatory Visit: Payer: PPO | Admitting: Podiatry

## 2023-04-03 ENCOUNTER — Encounter: Payer: Self-pay | Admitting: Podiatry

## 2023-04-03 VITALS — BP 163/71 | HR 71

## 2023-04-03 DIAGNOSIS — M79675 Pain in left toe(s): Secondary | ICD-10-CM | POA: Diagnosis not present

## 2023-04-03 DIAGNOSIS — M79674 Pain in right toe(s): Secondary | ICD-10-CM | POA: Diagnosis not present

## 2023-04-03 DIAGNOSIS — B351 Tinea unguium: Secondary | ICD-10-CM | POA: Diagnosis not present

## 2023-04-03 NOTE — Progress Notes (Unsigned)
Subjective:  Patient ID: Brittney Wright, female    DOB: [birth year],  MRN: [id number]   Brittney Wright presents to clinic today for:  Chief Complaint  Patient presents with   Diabetes    "Trim my toenails."  . Patient notes nails are thick, discolored, elongated and painful in shoegear when trying to ambulate.    PCP is Brittney Ishihara, MD.  Past Medical History:  Diagnosis Date   Back pain    Breast cancer ([facility]) 12/2011   Left breast DCIS   Cataract    Depression    Diabetes mellitus    History of radiation therapy 03/28/12- 04/26/12   left breast 4250 cGy 17 sessions, left breast boost 750 cGy 3 sessions   Hot flashes    Hypertension    Personal history of radiation therapy    SOB (shortness of breath) on exertion     Past Surgical History:  Procedure Laterality Date   BREAST BIOPSY     BREAST LUMPECTOMY Left 2013   BREAST SURGERY  1976   rt br bx   CATARACT EXTRACTION Bilateral    08/2011 and 05/2011   COLONOSCOPY     DILATION AND CURETTAGE OF UTERUS     EYE SURGERY  12,13   cataracts-both    Allergies  Allergen Reactions   Clobetasol Propionate     Other Reaction(s): break out on face   Iodinated Contrast Media     Other Reaction(s): Patient has stage 3 CKD   Minocycline     Tingling face/tongue, flushing   Nystatin     advicor   Sulfamethoxazole-Trimethoprim     Other Reaction(s): tingling in lips   Cephalexin Rash    Flushing of face and tingling in lips    Review of Systems: Negative except as noted in the HPI.  Objective:  Vitals:   04/03/23 1320  BP: (!) 163/71  Pulse: [name] is a pleasant 88 y.o. female in NAD. AAO x 3.  Vascular Examination: Capillary refill time is 3-5 seconds to toes bilateral. Palpable pedal pulses b/l LE. Digital hair present b/l.  Skin temperature gradient WNL b/l. No varicosities b/l. No cyanosis noted b/l.   Dermatological Examination: Pedal skin with normal turgor, texture  and tone b/l. No open wounds. No interdigital macerations b/l. Toenails x10 are 3mm thick, discolored, dystrophic with subungual debris. There is pain with compression of the nail plates.  They are elongated x10  Assessment/Plan: 1. Pain due to onychomycosis of toenails of both feet    The mycotic toenails were sharply debrided x10 with sterile nail nippers and a power debriding burr to decrease bulk/thickness and length.    Return in about 3 months (around 07/04/2023) for [facility].   Brittney Wright, DPM, FACFAS [facility]     [address] [place], [place] [address]                Office [phone]  Fax [phone]

## 2023-04-05 ENCOUNTER — Encounter: Payer: Self-pay | Admitting: Cardiology

## 2023-04-05 ENCOUNTER — Ambulatory Visit: Payer: PPO | Attending: Cardiology | Admitting: Cardiology

## 2023-04-05 VITALS — BP 120/66 | HR 72 | Resp 16 | Ht 65.0 in | Wt 198.8 lb

## 2023-04-05 DIAGNOSIS — R0609 Other forms of dyspnea: Secondary | ICD-10-CM | POA: Diagnosis not present

## 2023-04-05 DIAGNOSIS — I10 Essential (primary) hypertension: Secondary | ICD-10-CM

## 2023-04-05 DIAGNOSIS — E782 Mixed hyperlipidemia: Secondary | ICD-10-CM

## 2023-04-05 DIAGNOSIS — E1165 Type 2 diabetes mellitus with hyperglycemia: Secondary | ICD-10-CM

## 2023-04-05 DIAGNOSIS — Z794 Long term (current) use of insulin: Secondary | ICD-10-CM | POA: Diagnosis not present

## 2023-04-05 DIAGNOSIS — I5189 Other ill-defined heart diseases: Secondary | ICD-10-CM | POA: Diagnosis not present

## 2023-04-05 NOTE — Progress Notes (Signed)
Continue Cardiology Office Note:  .   Date:  04/05/2023  ID:  Brittney Wright, DOB August 10, 1934, MRN [id number] PCP:  Lorenda Ishihara, MD  Former Cardiology Providers: Dr. Clotilde Dieter [facility] Providers Cardiologist:  Tessa Lerner, DO , [facility] (established care 04/05/23) Electrophysiologist:  None  Click to update primary MD,subspecialty MD or APP then REFRESH:1}    Chief Complaint  Patient presents with   Shortness of Breath    History of Present Illness: .   Brittney Wright is a 88 y.o. Caucasian female whose past medical history and cardiovascular risk factors includes: Hypertension, hyperlipidemia, IDDM Type II, obesity due to excess calories.  Formally under the care of Dr. Rozell Searing Custovic who last saw Brittney Wright back in April 2024. I am seeing her for the first time to re-establishing care.   She was referred to cardiology in December 2023 due to shortness of breath and generalized swelling.  She underwent an echocardiogram which noted preserved LVEF with grade 1 diastolic dysfunction and no significant valvular heart disease.  Her medications were titrated and she has been followed by cardiology longitudinally.  I am seeing her for the first time.  Over last 6 months she denies anginal chest pain. Shortness of breath is chronic and stable-much improved compared to past.  She contributes her to dyspnea to being anxious. She chronically has lower extremity swelling.  She held off on the diuretics due to the appointment today. She prefers to sleep on her right side. 1 pillow orthopnea.   Review of Systems: .   Review of Systems  Cardiovascular:  Positive for dyspnea on exertion (chronic and stable). Negative for chest pain, claudication, irregular heartbeat, leg swelling, near-syncope, orthopnea, palpitations, paroxysmal nocturnal dyspnea and syncope.  Respiratory:  Positive for shortness of breath (chronic and stable).   Hematologic/Lymphatic:  Negative for bleeding problem.  Musculoskeletal:  Negative for muscle cramps and myalgias.  Neurological:  Negative for dizziness and light-headedness.  Psychiatric/Behavioral:  The patient is nervous/anxious.     Studies Reviewed:   EKG: EKG Interpretation Date/Time:  Wednesday April 05 2023 14:07:32 EDT Ventricular Rate:  72 PR Interval:  166 QRS Duration:  76 QT Interval:  426 QTC Calculation: 466 R Axis:   67  Text Interpretation: Normal sinus rhythm Normal ECG When compared with ECG of 31-Jan-2012 08:59, No significant change was found Confirmed by Tessa Lerner [phone]) on 04/05/2023 2:29:11 PM  Echocardiogram:  06/22/2022:   Normal LV systolic function with visual EF 60-65%. Left ventricle cavity is normal in size. Normal left ventricular wall thickness. Normal global wall motion. Doppler evidence of grade I (impaired) diastolic dysfunction, normal LAP. Calculated EF 69%. Structurally normal tricuspid valve.  Mild tricuspid regurgitation. No evidence of pulmonary hypertension. No significant change compared to 11/2017.   Stress Testing: NA  RADIOLOGY: NA  Risk Assessment/Calculations:   NA   Labs:       Latest Ref Rng & Units 06/18/2012   10:39 AM 03/01/2012    9:47 AM 02/03/2012   10:43 AM  CBC  WBC 3.9 - 10.3 10e3/uL 4.8  7.4    Hemoglobin 11.6 - 15.9 g/dL 25.9  56.3  87.5   Hematocrit 34.8 - 46.6 % 34.4  36.1    Platelets 145 - 400 10e3/uL 231  273         Latest Ref Rng & Units 06/18/2012   10:39 AM 01/31/2012   10:30 AM 01/18/2012    8:46 AM  BMP  Glucose  70 - 99 mg/dl 409  811  914   BUN 7.0 - 26.0 mg/dL 78.2  36  20   Creatinine 0.6 - 1.1 mg/dL 1.7  9.56  2.13   Sodium 136 - 145 mEq/L 139  137  134   Potassium 3.5 - 5.1 mEq/L 4.9  4.9  4.1   Chloride 98 - 107 mEq/L 107  103  100   CO2 22 - 29 mEq/L 25  24  24    Calcium 8.4 - 10.4 mg/dL 8.9  9.5  8.9       Latest Ref Rng & Units 06/18/2012   10:39 AM 01/31/2012   10:30 AM 01/18/2012    8:46  AM  CMP  Glucose 70 - 99 mg/dl 086  578  469   BUN 7.0 - 26.0 mg/dL 62.9  36  20   Creatinine 0.6 - 1.1 mg/dL 1.7  5.28  4.13   Sodium 136 - 145 mEq/L 139  137  134   Potassium 3.5 - 5.1 mEq/L 4.9  4.9  4.1   Chloride 98 - 107 mEq/L 107  103  100   CO2 22 - 29 mEq/L 25  24  24    Calcium 8.4 - 10.4 mg/dL 8.9  9.5  8.9   Total Protein 6.4 - 8.3 g/dL 7.0   7.4   Total Bilirubin 0.20 - 1.20 mg/dL 2.44   0.3   Alkaline Phos 40 - 150 U/L 51   59   AST 5 - 34 U/L 17   22   ALT 0 - 55 U/L 11   21     No results found for: "CHOL", "HDL", "LDLCALC", "LDLDIRECT", "TRIG", "CHOLHDL" No results for input(s): "LIPOA" in the last 8760 hours. No components found for: "NTPROBNP" No results for input(s): "PROBNP" in the last 8760 hours. No results for input(s): "TSH" in the last 8760 hours.  External Labs: Collected: 03/07/2023 [facility] database. Total cholesterol 141, triglycerides 183, HDL 40, LDL 70 Hemoglobin 01.0.  Collected: 02/13/2023 [facility] Database: A1c 8.7. BUN 40, creatinine 1.34  Physical Exam:    Today's Vitals   04/05/23 1411  BP: 120/66  Pulse: 72  Resp: 16  SpO2: 95%  Weight: 198 lb 12.8 oz (90.2 kg)  Height: 5\' 5"  (1.651 m)   Body mass index is 33.08 kg/m. Wt Readings from Last 3 Encounters:  04/05/23 198 lb 12.8 oz (90.2 kg)  09/21/22 197 lb 12.8 oz (89.7 kg)  06/22/22 207 lb 12.8 oz (94.3 kg)    Physical Exam  Constitutional: No distress.  hemodynamically stable  Neck: No JVD present.  Cardiovascular: Normal rate, regular rhythm, S1 normal and S2 normal. Exam reveals decreased pulses. Exam reveals no gallop, no S3 and no S4.  No murmur heard. Pulmonary/Chest: Effort normal and breath sounds normal. No stridor. She has no wheezes. She has no rales.  Abdominal: Soft. Bowel sounds are normal. She exhibits no distension. There is no abdominal tenderness.  Musculoskeletal:        General: Edema (trace) present.     Cervical back: Neck supple.  Neurological: She is alert  and oriented to person, place, and time. She has intact cranial nerves (2-12).  Skin: Skin is warm and moist.    Impression & Recommendation(s):  Impression:   ICD-10-CM   1. Diastolic dysfunction without heart failure  I51.89 Myocardial Perfusion Imaging    CT CARDIAC SCORING (SELF PAY ONLY)    Cardiac Stress Test: Informed Consent Details: Physician/Practitioner Attestation; Transcribe  to consent form and obtain patient signature    2. Dyspnea on exertion  R06.09 Cardiac Stress Test: Informed Consent Details: Physician/Practitioner Attestation; Transcribe to consent form and obtain patient signature    3. Essential hypertension  I10 EKG 12-Lead    Myocardial Perfusion Imaging    CT CARDIAC SCORING (SELF PAY ONLY)    4. Mixed hyperlipidemia  E78.2 Myocardial Perfusion Imaging    CT CARDIAC SCORING (SELF PAY ONLY)    5. Type 2 diabetes mellitus with hyperglycemia, with long-term current use of insulin (HCC)  E11.65 Myocardial Perfusion Imaging   Z79.4 CT CARDIAC SCORING (SELF PAY ONLY)       Recommendation(s):  Diastolic dysfunction without heart failure Dyspnea on exertion Prolonged history of dyspnea predominantly with effort related activities. Clinically euvolemic. Dyspnea could be secondary to diastolic dysfunction, obesity, deconditioning, being anxious (according to the patient). No prior ischemic workup despite her symptoms or risk factors Uptitration of medical therapy is limited due to soft blood pressures. He takes torsemide for volume management. Patient is agreeable with ischemic workup as I am concerned that her dyspnea could be her anginal equivalents. Coronary calcium score for further risk stratification Pharmacological stress test to evaluate for reversible ischemia-patient is unable to exercise  Essential hypertension Office blood pressures are very well-controlled. Continue carvedilol 6.25 mg p.o. twice daily. Continue losartan 12.5 mg.. Torsemide 20 mg  take 1 to 2 tablets by mouth daily.  Mixed hyperlipidemia Currently on atorvastatin 80 mg p.o. nightly.   She denies myalgia or other side effects. Most recent lipids dated September 2024, independently reviewed as noted above.  LDL is currently at goal at 70 mg/dL.  Type 2 diabetes mellitus with hyperglycemia, with long-term current use of insulin (HCC) Reemphasized importance of glycemic control.   Orders Placed:  Orders Placed This Encounter  Procedures   CT CARDIAC SCORING (SELF PAY ONLY)    Standing Status:   Future    Standing Expiration Date:   04/04/2024    Order Specific Question:   Preferred imaging location?    Answer:   [facility]   Cardiac Stress Test: Informed Consent Details: Physician/Practitioner Attestation; Transcribe to consent form and obtain patient signature    Order Specific Question:   Physician/Practitioner attestation of informed consent for procedure/surgical case    Answer:   I, the physician/practitioner, attest that I have discussed with the patient the benefits, risks, side effects, alternatives, likelihood of achieving goals and potential problems during recovery for the procedure that I have provided informed consent.    Order Specific Question:   Procedure    Answer:   Pharmacological stress test    Order Specific Question:   Indication/Reason    Answer:   Dyspnea on exertion/anginal equivalent/intermediate risk of CAD   Myocardial Perfusion Imaging    Standing Status:   Future    Standing Expiration Date:   04/04/2024    Order Specific Question:   Patient weight in lbs    Answer:   198    Order Specific Question:   Where should this be performed?    Answer:   [facility]    Order Specific Question:   Type of stress    Answer:   Lexiscan   EKG 12-Lead    Final Medication List:   No orders of the defined types were placed in this encounter.   Medications Discontinued During This Encounter  Medication Reason    amoxicillin (AMOXIL) 500 MG capsule  Ciclopirox 1 % shampoo    dapagliflozin propanediol (FARXIGA) 10 MG TABS tablet    Loteprednol Etabonate (LOTEMAX) 0.5 % GEL    timolol (TIMOPTIC) 0.5 % ophthalmic solution      Current Outpatient Medications:    ACCU-CHEK AVIVA PLUS test strip, 1 each by Other route as needed. , Disp: , Rfl:    atorvastatin (LIPITOR) 80 MG tablet, Take 1 tablet (80 mg total) by mouth daily., Disp: 90 tablet, Rfl: 1   Betamethasone Valerate 0.12 % foam, Apply 1 application topically as needed. , Disp: , Rfl:    carvedilol (COREG) 6.25 MG tablet, Take 1 tablet (6.25 mg total) by mouth 2 (two) times daily., Disp: 180 tablet, Rfl: 4   Cholecalciferol (VITAMIN D-400 PO), Take 1 tablet by mouth daily., Disp: , Rfl:    glucose blood (ONETOUCH VERIO) test strip, Check blood sugar 2 (two) times daily., Disp: 200 strip, Rfl: 4   glucose blood test strip, Use to check blood sugar once daily, Disp: 100 strip, Rfl: 1   insulin degludec (TRESIBA FLEXTOUCH) 100 UNIT/ML FlexTouch Pen, Start by injecting 22 units under the skin once daily, then titrate as directed to a maximum daily dose of 50 units (Patient taking differently: Inject 34 Units into the skin daily.), Disp: 30 mL, Rfl: 4   Insulin Pen Needle 32G X 4 MM MISC, Use once daily, Disp: 100 each, Rfl: 4   Lancets (ACCU-CHEK MULTICLIX) lancets, 1 each by Other route as needed. , Disp: , Rfl:    Lancets (ONETOUCH DELICA PLUS LANCET33G) MISC, Use to check blood sugar (finger stick) once daily, Disp: 100 each, Rfl: 2   latanoprost (XALATAN) 0.005 % ophthalmic solution, Place 1 drop into both eyes at bedtime., Disp: 7.5 mL, Rfl: 4   losartan (COZAAR) 25 MG tablet, Take 0.5 tablets (12.5 mg total) by mouth at bedtime., Disp: 100 tablet, Rfl: 3   torsemide (DEMADEX) 20 MG tablet, Take 1-2 tablets (20-40 mg total) by mouth daily., Disp: 90 tablet, Rfl: 3   torsemide (DEMADEX) 20 MG tablet, Take 20 mg by mouth daily., Disp: , Rfl:    Consent:   Informed Consent   Shared Decision Making/Informed Consent The risks [chest pain, shortness of breath, cardiac arrhythmias, dizziness, blood pressure fluctuations, myocardial infarction, stroke/transient ischemic attack, nausea, vomiting, allergic reaction, radiation exposure, metallic taste sensation and life-threatening complications (estimated to be 1 in 10,000)], benefits (risk stratification, diagnosing coronary artery disease, treatment guidance) and alternatives of a nuclear stress test were discussed in detail with Ms. Fiero and she agrees to proceed.      Disposition:   Follow-up with APP in 7 weeks to reevaluate her dyspnea and review coronary calcium score and stress test results to see if additional workup is warranted. Follow-up with myself in 6 months sooner if needed  Her questions and concerns were addressed to her satisfaction. She voices understanding of the recommendations provided during this encounter.    Signed, Tessa Lerner, DO, [facility]  [facility]  [address] [place], [place] [address] 04/05/2023 6:00 PM

## 2023-04-05 NOTE — Patient Instructions (Addendum)
Medication Instructions:  Your physician recommends that you continue on your current medications as directed. Please refer to the Current Medication list given to you today.  *If you need a refill on your cardiac medications before your next appointment, please call your pharmacy*  Lab Work: None ordered today. If you have labs (blood work) drawn today and your tests are completely normal, you will receive your results only by: MyChart Message (if you have MyChart) OR A paper copy in the mail If you have any lab test that is abnormal or we need to change your treatment, we will call you to review the results.  Testing/Procedures: Your physician has requested that you have a lexiscan myoview. For further information please visit [email or web address] Please follow instruction sheet, as given.  Your physician has requested that you have a coronary calcium score performed. This is not covered by insurance and will be an out-of-pocket cost of approximately $99.   Follow-Up: At [facility], you and your health needs are our priority.  As part of our continuing mission to provide you with exceptional heart care, we have created designated Provider Care Teams.  These Care Teams include your primary Cardiologist (physician) and Advanced Practice Providers (APPs -  Physician Assistants and Nurse Practitioners) who all work together to provide you with the care you need, when you need it.  We recommend signing up for the patient portal called "MyChart".  Sign up information is provided on this After Visit Summary.  MyChart is used to connect with patients for Virtual Visits (Telemedicine).  Patients are able to view lab/test results, encounter notes, upcoming appointments, etc.  Non-urgent messages can be sent to your provider as well.   To learn more about what you can do with MyChart, go to [email or web address].    Your next appointment:   7 week(s)  The format for your next appointment:    In Person  Provider:   Jari Favre, PA-C, Ronie Spies, PA-C, Robin Searing, NP, Eligha Bridegroom, NP, Tereso Newcomer, PA-C, or Perlie Gold, PA-C. Then, [employer], DO will plan to see you again in 6 month(s).{  Other Instructions Lexiscan Myoview (Stress Test) Instructions  Please arrive 15 minutes prior to your appointment time for registration and insurance purposes.   The test will take approximately 3 to 4 hours to complete; you may bring reading material.  If someone comes with you to your appointment, they will need to remain in the main lobby due to limited space in the testing area.    How to prepare for your Myocardial Perfusion Test: Do not eat or drink 3 hours prior to your test, except you may have water. Do not consume products containing caffeine (regular or decaffeinated) 12 hours prior to your test. (ex: coffee, chocolate, sodas, tea). Do bring a list of your current medications with you.  Do wear comfortable clothes (no dresses or overalls) and walking shoes, tennis shoes preferred (No heels or open toe shoes are allowed). Do NOT wear cologne, perfume, aftershave, or lotions (deodorant is allowed). If these instructions are not followed, your test will have to be rescheduled.   Please report to [address], [address] for your test.  If you have questions or concerns about your appointment, you can call the Nuclear Lab at [phone].   If you cannot keep your appointment, please provide 24 hours notification to the Nuclear Lab, to avoid a possible $50 charge to your account.

## 2023-04-07 ENCOUNTER — Other Ambulatory Visit (HOSPITAL_BASED_OUTPATIENT_CLINIC_OR_DEPARTMENT_OTHER): Payer: Self-pay

## 2023-04-11 ENCOUNTER — Telehealth: Payer: Self-pay

## 2023-04-11 DIAGNOSIS — R0602 Shortness of breath: Secondary | ICD-10-CM

## 2023-04-11 NOTE — Telephone Encounter (Signed)
Can you arrange for a coronary artery calcium score as discussed at the last office visit.   Simone Rodenbeck [place], DO, [facility]

## 2023-04-11 NOTE — Telephone Encounter (Signed)
Patient calling in stating she was needing to reschedule her coronary calcium score CT, but when she called to reschedule it she was told order was "closed" and could only be cancelled but not rescheduled. She canceled the CT at that time and called our office to try and get new order. Forwarded to Dr. Odis Hollingshead to ensure he still wants this test so it can be reordered.

## 2023-04-11 NOTE — Telephone Encounter (Signed)
Call to patient that coronary calcium score was reordered and someone from that department would be calling to reschedule.

## 2023-04-11 NOTE — Telephone Encounter (Signed)
Coronary calcium score reordered as previous order expired.

## 2023-04-11 NOTE — Addendum Note (Signed)
Addended by: Luellen Pucker on: 04/11/2023 03:44 PM   Modules accepted: Orders

## 2023-04-12 ENCOUNTER — Other Ambulatory Visit (HOSPITAL_COMMUNITY): Payer: Self-pay

## 2023-04-12 ENCOUNTER — Other Ambulatory Visit: Payer: Self-pay

## 2023-04-13 ENCOUNTER — Other Ambulatory Visit: Payer: Self-pay | Admitting: Internal Medicine

## 2023-04-13 ENCOUNTER — Telehealth (HOSPITAL_COMMUNITY): Payer: Self-pay

## 2023-04-13 DIAGNOSIS — Z9889 Other specified postprocedural states: Secondary | ICD-10-CM

## 2023-04-13 NOTE — Telephone Encounter (Signed)
Detailed instructions left on the patient's answering machine. Asked to call back with any questions. S.Aby Gessel CCT

## 2023-04-18 ENCOUNTER — Ambulatory Visit (HOSPITAL_COMMUNITY): Payer: PPO | Attending: Cardiology

## 2023-04-18 DIAGNOSIS — Z794 Long term (current) use of insulin: Secondary | ICD-10-CM | POA: Diagnosis not present

## 2023-04-18 DIAGNOSIS — E782 Mixed hyperlipidemia: Secondary | ICD-10-CM | POA: Insufficient documentation

## 2023-04-18 DIAGNOSIS — I5189 Other ill-defined heart diseases: Secondary | ICD-10-CM | POA: Insufficient documentation

## 2023-04-18 DIAGNOSIS — I10 Essential (primary) hypertension: Secondary | ICD-10-CM | POA: Diagnosis not present

## 2023-04-18 DIAGNOSIS — E1165 Type 2 diabetes mellitus with hyperglycemia: Secondary | ICD-10-CM | POA: Diagnosis not present

## 2023-04-18 LAB — MYOCARDIAL PERFUSION IMAGING
LV dias vol: 40 mL (ref 46–106)
LV sys vol: 13 mL
Nuc Stress EF: 68 %
Peak HR: 91 {beats}/min
Rest HR: 73 {beats}/min
Rest Nuclear Isotope Dose: 8.8 mCi
SDS: 0
SRS: 0
SSS: 0
ST Depression (mm): 0 mm
Stress Nuclear Isotope Dose: 25.8 mCi
TID: 0.92

## 2023-04-18 MED ORDER — REGADENOSON 0.4 MG/5ML IV SOLN
0.4000 mg | Freq: Once | INTRAVENOUS | Status: AC
  Administered 2023-04-18: 0.4 mg via INTRAVENOUS

## 2023-04-18 MED ORDER — TECHNETIUM TC 99M TETROFOSMIN IV KIT
8.8000 | PACK | Freq: Once | INTRAVENOUS | Status: AC | PRN
  Administered 2023-04-18: 8.8 via INTRAVENOUS

## 2023-04-18 MED ORDER — TECHNETIUM TC 99M TETROFOSMIN IV KIT
25.8000 | PACK | Freq: Once | INTRAVENOUS | Status: AC | PRN
  Administered 2023-04-18: 25.8 via INTRAVENOUS

## 2023-04-19 ENCOUNTER — Other Ambulatory Visit (HOSPITAL_COMMUNITY): Payer: Self-pay

## 2023-04-21 ENCOUNTER — Telehealth: Payer: Self-pay | Admitting: Cardiology

## 2023-04-21 NOTE — Telephone Encounter (Signed)
Patient is returning a call back to discuss stress test results.

## 2023-04-21 NOTE — Telephone Encounter (Signed)
Patient calling to get stress test results. She is aware provider is out of ofice once he have reviewed the test we will call her with results.

## 2023-04-24 ENCOUNTER — Other Ambulatory Visit: Payer: Self-pay

## 2023-04-26 ENCOUNTER — Other Ambulatory Visit: Payer: Self-pay | Admitting: Internal Medicine

## 2023-04-26 ENCOUNTER — Ambulatory Visit
Admission: RE | Admit: 2023-04-26 | Discharge: 2023-04-26 | Disposition: A | Discharge status: Home / Self Care | Payer: PPO | Source: Ambulatory Visit | Attending: Internal Medicine | Admitting: Internal Medicine

## 2023-04-26 DIAGNOSIS — N6320 Unspecified lump in the left breast, unspecified quadrant: Secondary | ICD-10-CM

## 2023-04-26 DIAGNOSIS — Z9889 Other specified postprocedural states: Secondary | ICD-10-CM

## 2023-04-26 DIAGNOSIS — R928 Other abnormal and inconclusive findings on diagnostic imaging of breast: Secondary | ICD-10-CM | POA: Diagnosis not present

## 2023-04-28 ENCOUNTER — Ambulatory Visit
Admission: RE | Admit: 2023-04-28 | Discharge: 2023-04-28 | Disposition: A | Discharge status: Home / Self Care | Payer: PPO | Source: Ambulatory Visit | Attending: Internal Medicine | Admitting: Internal Medicine

## 2023-04-28 DIAGNOSIS — N6320 Unspecified lump in the left breast, unspecified quadrant: Secondary | ICD-10-CM

## 2023-04-28 DIAGNOSIS — D241 Benign neoplasm of right breast: Secondary | ICD-10-CM | POA: Diagnosis not present

## 2023-04-28 DIAGNOSIS — N6321 Unspecified lump in the left breast, upper outer quadrant: Secondary | ICD-10-CM | POA: Diagnosis not present

## 2023-04-28 HISTORY — PX: BREAST BIOPSY: SHX20

## 2023-04-28 NOTE — Telephone Encounter (Signed)
Results were sent to the patient on 04/23/2023.  It appears the patient has reviewed them.  Please make sure.  Kemo Spruce [place], DO, [facility]

## 2023-05-01 LAB — SURGICAL PATHOLOGY

## 2023-05-01 NOTE — Telephone Encounter (Signed)
Left voicemail to return call to office.

## 2023-05-01 NOTE — Telephone Encounter (Signed)
Follow Up:       Patient bis returning a call from today.

## 2023-05-01 NOTE — Telephone Encounter (Signed)
Patient identification verified by 2 forms. Marilynn Rail, RN   Called and spoke to patient  Patient states:   -was outreached and told results were normal  Advised patient to keep 12/5 appointment for follow up  Patient has no further questions at this time

## 2023-05-04 ENCOUNTER — Other Ambulatory Visit: Payer: Self-pay

## 2023-05-05 ENCOUNTER — Other Ambulatory Visit (HOSPITAL_BASED_OUTPATIENT_CLINIC_OR_DEPARTMENT_OTHER): Payer: Self-pay

## 2023-05-05 MED ORDER — COVID-19 MRNA VAC-TRIS(PFIZER) 30 MCG/0.3ML IM SUSY
0.3000 mL | PREFILLED_SYRINGE | Freq: Once | INTRAMUSCULAR | 0 refills | Status: AC
  Filled 2023-05-05: qty 0.3, 1d supply, fill #0

## 2023-05-09 ENCOUNTER — Ambulatory Visit (HOSPITAL_BASED_OUTPATIENT_CLINIC_OR_DEPARTMENT_OTHER): Payer: PPO

## 2023-05-09 DIAGNOSIS — E119 Type 2 diabetes mellitus without complications: Secondary | ICD-10-CM | POA: Diagnosis not present

## 2023-05-09 DIAGNOSIS — H524 Presbyopia: Secondary | ICD-10-CM | POA: Diagnosis not present

## 2023-05-09 DIAGNOSIS — H02822 Cysts of right lower eyelid: Secondary | ICD-10-CM | POA: Diagnosis not present

## 2023-05-09 DIAGNOSIS — H35372 Puckering of macula, left eye: Secondary | ICD-10-CM | POA: Diagnosis not present

## 2023-05-09 DIAGNOSIS — H402231 Chronic angle-closure glaucoma, bilateral, mild stage: Secondary | ICD-10-CM | POA: Diagnosis not present

## 2023-05-10 ENCOUNTER — Ambulatory Visit (HOSPITAL_BASED_OUTPATIENT_CLINIC_OR_DEPARTMENT_OTHER)
Admission: RE | Admit: 2023-05-10 | Discharge: 2023-05-10 | Disposition: A | Discharge status: Home / Self Care | Payer: PPO | Source: Ambulatory Visit | Attending: Cardiology | Admitting: Cardiology

## 2023-05-10 ENCOUNTER — Other Ambulatory Visit (HOSPITAL_BASED_OUTPATIENT_CLINIC_OR_DEPARTMENT_OTHER): Payer: Self-pay

## 2023-05-10 DIAGNOSIS — R0602 Shortness of breath: Secondary | ICD-10-CM | POA: Insufficient documentation

## 2023-05-10 MED ORDER — AREXVY 120 MCG/0.5ML IM SUSR
0.5000 mL | Freq: Once | INTRAMUSCULAR | 0 refills | Status: AC
  Filled 2023-05-10: qty 0.5, 1d supply, fill #0

## 2023-05-12 ENCOUNTER — Other Ambulatory Visit: Payer: Self-pay

## 2023-05-12 ENCOUNTER — Other Ambulatory Visit (HOSPITAL_COMMUNITY): Payer: Self-pay

## 2023-05-25 ENCOUNTER — Ambulatory Visit: Payer: PPO | Attending: Physician Assistant | Admitting: Physician Assistant

## 2023-05-25 ENCOUNTER — Encounter: Payer: Self-pay | Admitting: Physician Assistant

## 2023-05-25 ENCOUNTER — Other Ambulatory Visit (HOSPITAL_COMMUNITY): Payer: Self-pay

## 2023-05-25 ENCOUNTER — Other Ambulatory Visit: Payer: Self-pay

## 2023-05-25 VITALS — BP 124/60 | HR 78 | Ht 65.0 in | Wt 198.0 lb

## 2023-05-25 DIAGNOSIS — Z794 Long term (current) use of insulin: Secondary | ICD-10-CM | POA: Diagnosis not present

## 2023-05-25 DIAGNOSIS — I10 Essential (primary) hypertension: Secondary | ICD-10-CM | POA: Diagnosis not present

## 2023-05-25 DIAGNOSIS — R0609 Other forms of dyspnea: Secondary | ICD-10-CM

## 2023-05-25 DIAGNOSIS — E782 Mixed hyperlipidemia: Secondary | ICD-10-CM | POA: Diagnosis not present

## 2023-05-25 DIAGNOSIS — I5189 Other ill-defined heart diseases: Secondary | ICD-10-CM | POA: Diagnosis not present

## 2023-05-25 DIAGNOSIS — R0602 Shortness of breath: Secondary | ICD-10-CM

## 2023-05-25 DIAGNOSIS — E1165 Type 2 diabetes mellitus with hyperglycemia: Secondary | ICD-10-CM

## 2023-05-25 MED ORDER — ASPIRIN 81 MG PO TBEC: 81.0000 mg | DELAYED_RELEASE_TABLET | Freq: Every day | ORAL | Status: AC

## 2023-05-25 MED ORDER — ATORVASTATIN CALCIUM 80 MG PO TABS
80.0000 mg | ORAL_TABLET | Freq: Every day | ORAL | 3 refills | Status: DC
  Filled 2023-05-25: qty 90, 90d supply, fill #0
  Filled 2023-08-16: qty 90, 90d supply, fill #1
  Filled 2023-11-16: qty 90, 90d supply, fill #2
  Filled 2024-02-12: qty 90, 90d supply, fill #3

## 2023-05-25 MED ORDER — EZETIMIBE 10 MG PO TABS
10.0000 mg | ORAL_TABLET | Freq: Every day | ORAL | 3 refills | Status: DC
  Filled 2023-05-25: qty 90, 90d supply, fill #0
  Filled 2023-08-16: qty 90, 90d supply, fill #1
  Filled 2023-11-16: qty 90, 90d supply, fill #2
  Filled 2024-02-12: qty 90, 90d supply, fill #3

## 2023-05-25 NOTE — Progress Notes (Signed)
Cardiology Office Note:  .   Date:  05/25/2023  ID:  Brittney Wright, DOB [birth year], MRN [id number] PCP: Lorenda Ishihara, MD  [facility] Providers Cardiologist:  Gracy Ehly Lerner, DO {  History of Present Illness: .   Brittney Wright is a 88 y.o. female with a past medical history and cardiovascular factors of hypertension, hyperlipidemia, IDDM type II, and obesity here for follow-up appointment.  Was originally seen by Dr. Clotilde Dieter who saw her back in April 2024.  She then reestablish care with Dr. Odis Hollingshead in October.  She was originally referred to cardiology back in December 2023 due to shortness of breath and generalized swelling.  Underwent echocardiogram which noted preserved LVEF and grade 1 DD with no significant valvular disease.  Medications were titrated and she has been following cardiology since.  Over the past 6 months she denied anginal chest pain, shortness of breath, chronic which has been stable.  Attributes her dyspnea to being anxious.  Chronically she has lower extremity swelling.  She held off on diuretics due to her appointment.  For sleep on her right side and she has 1 pillow orthopnea.  Today, she has a  history of coronary artery disease, presented with concerns about her high coronary calcium score of 728, which is in the 82nd percentile for her age, sex, and race. The patient expressed anxiety about the implications of this score, particularly in relation to her shortness of breath and edema. The patient has been managing these symptoms with torsemide, a diuretic, which she takes one to two times daily as needed. The patient also reported a history of insomnia, waking up every two and a half hours at night, often to urinate. The patient has been on atorvastatin for years to manage her LDL cholesterol, which was 70 at the last check in September. The patient stopped taking a low-dose aspirin regimen last year due to prolonged bleeding from a minor  cut.  Reports no shortness of breath nor dyspnea on exertion. Reports no chest pain, pressure, or tightness. No edema, orthopnea, PND. Reports no palpitations.    Discussed the use of AI scribe software for clinical note transcription with the patient, who gave verbal consent to proceed.    ROS: Pertinent ROS in HPI  Studies Reviewed: .        Echocardiogram:  06/22/2022:   Normal LV systolic function with visual EF 60-65%. Left ventricle cavity is normal in size. Normal left ventricular wall thickness. Normal global wall motion. Doppler evidence of grade I (impaired) diastolic dysfunction, normal LAP. Calculated EF 69%. Structurally normal tricuspid valve.  Mild tricuspid regurgitation. No evidence of pulmonary hypertension. No significant change compared to 11/2017.     Physical Exam:   VS:  BP 124/60   Pulse 78   Ht 5\' 5"  (1.651 m)   Wt 198 lb (89.8 kg)   SpO2 97%   BMI 32.95 kg/m    Wt Readings from Last 3 Encounters:  05/25/23 198 lb (89.8 kg)  04/18/23 198 lb (89.8 kg)  04/05/23 198 lb 12.8 oz (90.2 kg)    GEN: Well nourished, well developed in no acute distress NECK: No JVD; No carotid bruits CARDIAC: RRR, no murmurs, rubs, gallops RESPIRATORY:  Clear to auscultation without rales, wheezing or rhonchi  ABDOMEN: Soft, non-tender, non-distended EXTREMITIES:  No edema; No deformity   ASSESSMENT AND PLAN: .   Hypertension -Her blood pressure is well-controlled today 124/60 -Suggest continuing current medication regimen with her carvedilol 6.25  mg twice a day, losartan 12.5 mg at bedtime, Demadex 20 mg daily (sometimes takes 40 mg as needed)   Coronary Artery Disease High calcium score (728, 82nd percentile) with significant stenosis in LAD, RCA, and circumflex arteries. LDL at goal (70) on Atorvastatin. Discussed adding Zetia to further lower LDL and starting low-dose Aspirin for secondary prevention. -Start Aspirin 81mg  daily, monitor for bleeding. -Add Zetia 10mg   daily to current Atorvastatin regimen. -Repeat lipid panel in 3 months.  Diastolic Dysfunction Grade 1 diastolic dysfunction with symptoms of leg swelling and shortness of breath. Patient on Torsemide 20mg  daily, with occasional additional 20mg  as needed. -Continue Torsemide 20mg  daily, with additional 20mg  as needed. -Monitor fluid intake, aim for 50-64 ounces daily. -Consider limiting fluid intake around bedtime to improve sleep.  General Health Maintenance -Continue Vitamin D supplementation. -Consider carotid ultrasound if lightheadedness or dizziness recurs.      Dispo: She can follow-up in 3 months with Dr. Odis Hollingshead  Signed, Sharlene Dory, PA-C

## 2023-05-25 NOTE — Patient Instructions (Signed)
Medication Instructions:  START Aspirin 81mg  Take 1 tablet once a day  START Zetia (Ezetimibe) 10mg  Take 1 tablet daily at night  TAKE Atorvastatin daily at night, can take together with Zetia  *If you need a refill on your cardiac medications before your next appointment, please call your pharmacy*   Lab Work: 3 MONTHS FASTING LABS-LIPIDS & LFTS If you have labs (blood work) drawn today and your tests are completely normal, you will receive your results only by: MyChart Message (if you have MyChart) OR A paper copy in the mail If you have any lab test that is abnormal or we need to change your treatment, we will call you to review the results.   Testing/Procedures: NONE ORDERED   Follow-Up: At [facility], you and your health needs are our priority.  As part of our continuing mission to provide you with exceptional heart care, we have created designated Provider Care Teams.  These Care Teams include your primary Cardiologist (physician) and Advanced Practice Providers (APPs -  Physician Assistants and Nurse Practitioners) who all work together to provide you with the care you need, when you need it.  We recommend signing up for the patient portal called "MyChart".  Sign up information is provided on this After Visit Summary.  MyChart is used to connect with patients for Virtual Visits (Telemedicine).  Patients are able to view lab/test results, encounter notes, upcoming appointments, etc.  Non-urgent messages can be sent to your provider as well.   To learn more about what you can do with MyChart, go to [email or web address].    Your next appointment:   3 month(s)  Provider:   Tessa Lerner, DO     Other Instructions

## 2023-06-01 ENCOUNTER — Other Ambulatory Visit (HOSPITAL_COMMUNITY): Payer: Self-pay

## 2023-06-02 ENCOUNTER — Other Ambulatory Visit (HOSPITAL_COMMUNITY): Payer: Self-pay

## 2023-06-02 ENCOUNTER — Other Ambulatory Visit: Payer: Self-pay

## 2023-06-02 MED ORDER — LATANOPROST 0.005 % OP SOLN
1.0000 [drp] | Freq: Every day | OPHTHALMIC | 2 refills | Status: AC
  Filled 2023-06-02: qty 7.5, 56d supply, fill #0
  Filled 2023-06-02: qty 7.5, 75d supply, fill #0
  Filled 2023-08-16: qty 7.5, 75d supply, fill #1

## 2023-06-06 DIAGNOSIS — N1832 Chronic kidney disease, stage 3b: Secondary | ICD-10-CM | POA: Diagnosis not present

## 2023-06-06 DIAGNOSIS — E11319 Type 2 diabetes mellitus with unspecified diabetic retinopathy without macular edema: Secondary | ICD-10-CM | POA: Diagnosis not present

## 2023-06-06 DIAGNOSIS — E1122 Type 2 diabetes mellitus with diabetic chronic kidney disease: Secondary | ICD-10-CM | POA: Diagnosis not present

## 2023-06-06 DIAGNOSIS — E1142 Type 2 diabetes mellitus with diabetic polyneuropathy: Secondary | ICD-10-CM | POA: Diagnosis not present

## 2023-06-22 ENCOUNTER — Other Ambulatory Visit: Payer: Self-pay

## 2023-06-23 ENCOUNTER — Other Ambulatory Visit: Payer: Self-pay

## 2023-06-25 ENCOUNTER — Other Ambulatory Visit (HOSPITAL_COMMUNITY): Payer: Self-pay

## 2023-06-26 ENCOUNTER — Other Ambulatory Visit: Payer: Self-pay

## 2023-06-27 ENCOUNTER — Other Ambulatory Visit: Payer: Self-pay

## 2023-07-10 ENCOUNTER — Ambulatory Visit: Payer: PPO | Admitting: Podiatry

## 2023-07-17 ENCOUNTER — Other Ambulatory Visit (HOSPITAL_COMMUNITY): Payer: Self-pay

## 2023-07-17 ENCOUNTER — Ambulatory Visit: Payer: PPO | Admitting: Podiatry

## 2023-07-18 ENCOUNTER — Other Ambulatory Visit: Payer: Self-pay

## 2023-07-18 ENCOUNTER — Other Ambulatory Visit (HOSPITAL_COMMUNITY): Payer: Self-pay

## 2023-07-18 MED ORDER — BLOOD GLUCOSE MONITOR SYSTEM W/DEVICE KIT
PACK | 0 refills | Status: AC
  Filled 2023-07-18: qty 1, 30d supply, fill #0

## 2023-08-03 ENCOUNTER — Other Ambulatory Visit (HOSPITAL_COMMUNITY): Payer: Self-pay

## 2023-08-03 ENCOUNTER — Other Ambulatory Visit: Payer: Self-pay | Admitting: Cardiology

## 2023-08-03 MED ORDER — LOSARTAN POTASSIUM 25 MG PO TABS
12.5000 mg | ORAL_TABLET | Freq: Every day | ORAL | 3 refills | Status: AC
  Filled 2023-08-03: qty 50, 100d supply, fill #0
  Filled 2023-11-16: qty 50, 100d supply, fill #1
  Filled 2024-02-23: qty 50, 100d supply, fill #2
  Filled 2024-05-29: qty 50, 100d supply, fill #3

## 2023-08-04 ENCOUNTER — Other Ambulatory Visit (HOSPITAL_COMMUNITY): Payer: Self-pay

## 2023-08-16 ENCOUNTER — Other Ambulatory Visit (HOSPITAL_COMMUNITY): Payer: Self-pay

## 2023-08-16 ENCOUNTER — Other Ambulatory Visit: Payer: Self-pay

## 2023-08-16 DIAGNOSIS — I129 Hypertensive chronic kidney disease with stage 1 through stage 4 chronic kidney disease, or unspecified chronic kidney disease: Secondary | ICD-10-CM | POA: Diagnosis not present

## 2023-08-16 DIAGNOSIS — E1122 Type 2 diabetes mellitus with diabetic chronic kidney disease: Secondary | ICD-10-CM | POA: Diagnosis not present

## 2023-08-16 DIAGNOSIS — N1832 Chronic kidney disease, stage 3b: Secondary | ICD-10-CM | POA: Diagnosis not present

## 2023-08-16 DIAGNOSIS — N2581 Secondary hyperparathyroidism of renal origin: Secondary | ICD-10-CM | POA: Diagnosis not present

## 2023-08-16 DIAGNOSIS — E785 Hyperlipidemia, unspecified: Secondary | ICD-10-CM | POA: Diagnosis not present

## 2023-08-16 MED ORDER — TORSEMIDE 20 MG PO TABS
20.0000 mg | ORAL_TABLET | Freq: Every day | ORAL | 3 refills | Status: DC
  Filled 2023-08-16: qty 90, 45d supply, fill #0
  Filled 2023-09-25: qty 90, 45d supply, fill #1
  Filled 2023-11-10: qty 90, 45d supply, fill #2
  Filled 2024-01-22: qty 90, 45d supply, fill #3

## 2023-08-17 ENCOUNTER — Other Ambulatory Visit (HOSPITAL_COMMUNITY): Payer: Self-pay

## 2023-08-17 MED ORDER — TRESIBA FLEXTOUCH 100 UNIT/ML ~~LOC~~ SOPN
PEN_INJECTOR | SUBCUTANEOUS | 4 refills | Status: DC
  Filled 2023-08-17: qty 30, 60d supply, fill #0
  Filled 2023-10-15: qty 30, 60d supply, fill #1
  Filled 2023-12-17: qty 30, 60d supply, fill #2
  Filled 2024-02-12: qty 30, 60d supply, fill #3
  Filled 2024-04-11: qty 30, 60d supply, fill #4

## 2023-08-17 MED ORDER — INSULIN PEN NEEDLE 32G X 4 MM MISC
4 refills | Status: AC
  Filled 2023-08-17: qty 100, 100d supply, fill #0
  Filled 2023-12-06: qty 100, 100d supply, fill #1
  Filled 2024-03-06: qty 100, 100d supply, fill #2
  Filled 2024-06-18: qty 100, 100d supply, fill #0

## 2023-08-18 ENCOUNTER — Other Ambulatory Visit: Payer: Self-pay

## 2023-08-22 DIAGNOSIS — E1129 Type 2 diabetes mellitus with other diabetic kidney complication: Secondary | ICD-10-CM | POA: Diagnosis not present

## 2023-08-22 DIAGNOSIS — E785 Hyperlipidemia, unspecified: Secondary | ICD-10-CM | POA: Diagnosis not present

## 2023-08-22 DIAGNOSIS — N1832 Chronic kidney disease, stage 3b: Secondary | ICD-10-CM | POA: Diagnosis not present

## 2023-09-05 ENCOUNTER — Other Ambulatory Visit (HOSPITAL_COMMUNITY): Payer: Self-pay

## 2023-09-06 ENCOUNTER — Other Ambulatory Visit: Payer: Self-pay

## 2023-09-06 ENCOUNTER — Other Ambulatory Visit (HOSPITAL_COMMUNITY): Payer: Self-pay

## 2023-09-06 MED ORDER — CEPHALEXIN 500 MG PO CAPS
500.0000 mg | ORAL_CAPSULE | Freq: Two times a day (BID) | ORAL | 0 refills | Status: AC
  Filled 2023-09-06: qty 10, 5d supply, fill #0

## 2023-09-14 ENCOUNTER — Ambulatory Visit: Payer: PPO | Attending: Cardiology | Admitting: Cardiology

## 2023-09-14 VITALS — BP 118/70 | HR 79 | Resp 16 | Ht 65.0 in | Wt 201.0 lb

## 2023-09-14 DIAGNOSIS — I10 Essential (primary) hypertension: Secondary | ICD-10-CM

## 2023-09-14 DIAGNOSIS — R931 Abnormal findings on diagnostic imaging of heart and coronary circulation: Secondary | ICD-10-CM | POA: Diagnosis not present

## 2023-09-14 DIAGNOSIS — E1165 Type 2 diabetes mellitus with hyperglycemia: Secondary | ICD-10-CM

## 2023-09-14 DIAGNOSIS — I700 Atherosclerosis of aorta: Secondary | ICD-10-CM

## 2023-09-14 DIAGNOSIS — Z794 Long term (current) use of insulin: Secondary | ICD-10-CM | POA: Diagnosis not present

## 2023-09-14 DIAGNOSIS — I5032 Chronic diastolic (congestive) heart failure: Secondary | ICD-10-CM | POA: Diagnosis not present

## 2023-09-14 DIAGNOSIS — I2510 Atherosclerotic heart disease of native coronary artery without angina pectoris: Secondary | ICD-10-CM

## 2023-09-14 DIAGNOSIS — E782 Mixed hyperlipidemia: Secondary | ICD-10-CM

## 2023-09-14 DIAGNOSIS — I2584 Coronary atherosclerosis due to calcified coronary lesion: Secondary | ICD-10-CM

## 2023-09-14 NOTE — Progress Notes (Signed)
 Cardiology Office Note:  .   ID:  Brittney Wright, DOB 1934-08-03, MRN [id number] PCP:  Lorenda Ishihara, MD  Former Cardiology Providers: Dr. Clotilde Dieter [facility] Providers Cardiologist:  Tessa Lerner, DO , [facility] (established care 04/05/23) Electrophysiologist:  None  Click to update primary MD,subspecialty MD or APP then REFRESH:1}    Chief Complaint  Patient presents with   Follow-up    Coronary artery calcification    History of Present Illness: .   Brittney Wright is a 89 y.o. Caucasian female whose past medical history and cardiovascular risk factors includes: Severe coronary artery calcification, aortic atherosclerosis, hypertension, hyperlipidemia, IDDM Type II, obesity due to excess calories.  Started seeing the patient after she transitioned care in October 2024.  At the last office visit due to her dyspnea on exertion there was concern that she may be developing HFpEF.  The patient thought that her symptoms are likely secondary to increased anxiety.  Patient was recommended to undergo coronary calcium score for further risk stratification.  She was noted to have severe CAC with a total score of 728 placing her at the 82nd percentile.  She underwent nuclear stress test which was reported to be low risk.  Since last office visit patient followed up with Jari Favre in December 2024 at which time she was started on aspirin 81 mg p.o. daily also Zetia 10 mg p.o. daily was added to optimize her lipids.  She presents today for 11-month follow-up visit.  Over the last 3 months patient denies any anginal chest pain or heart failure symptoms.  Shortness of breath with effort related activities has also improved with medical therapy.  Review of Systems: .   Review of Systems  Cardiovascular:  Positive for dyspnea on exertion (improved). Negative for chest pain, claudication, irregular heartbeat, leg swelling, near-syncope, orthopnea, palpitations, paroxysmal  nocturnal dyspnea and syncope.  Hematologic/Lymphatic: Negative for bleeding problem.  Musculoskeletal:  Negative for muscle cramps and myalgias.  Neurological:  Negative for dizziness and light-headedness.  Psychiatric/Behavioral:  The patient is nervous/anxious.     Studies Reviewed:   Echocardiogram:  06/22/2022:   Normal LV systolic function with visual EF 60-65%. Left ventricle cavity is normal in size. Normal left ventricular wall thickness. Normal global wall motion. Doppler evidence of grade I (impaired) diastolic dysfunction, normal LAP. Calculated EF 69%. Structurally normal tricuspid valve.  Mild tricuspid regurgitation. No evidence of pulmonary hypertension. No significant change compared to 11/2017.   Stress Testing: MPI 04/18/2023:   The study is normal. The study is low risk.   No ST deviation was noted.   Left ventricular function is normal. Nuclear stress EF: 68%. The left ventricular ejection fraction is hyperdynamic (>65%). End diastolic cavity size is normal.  Coronary Calcium Score: 05/10/2023 Left main: 46.4 Left anterior descending artery: 303 Left circumflex artery: 148 Right coronary artery: 231 Total: 728, Percentile: 82nd Radiology Overread: Aortic atherosclerosis  RADIOLOGY: NA  Risk Assessment/Calculations:   NA   Labs:       Latest Ref Rng & Units 06/18/2012   10:39 AM 03/01/2012    9:47 AM 02/03/2012   10:43 AM  CBC  WBC 3.9 - 10.3 10e3/uL 4.8  7.4    Hemoglobin 11.6 - 15.9 g/dL 40.9  81.1  91.4   Hematocrit 34.8 - 46.6 % 34.4  36.1    Platelets 145 - 400 10e3/uL 231  273         Latest Ref Rng & Units 06/18/2012  10:39 AM 01/31/2012   10:30 AM 01/18/2012    8:46 AM  BMP  Glucose 70 - 99 mg/dl 119  147  829   BUN 7.0 - 26.0 mg/dL 56.2  36  20   Creatinine 0.6 - 1.1 mg/dL 1.7  1.30  8.65   Sodium 136 - 145 mEq/L 139  137  134   Potassium 3.5 - 5.1 mEq/L 4.9  4.9  4.1   Chloride 98 - 107 mEq/L 107  103  100   CO2 22 - 29  mEq/L 25  24  24    Calcium 8.4 - 10.4 mg/dL 8.9  9.5  8.9       Latest Ref Rng & Units 06/18/2012   10:39 AM 01/31/2012   10:30 AM 01/18/2012    8:46 AM  CMP  Glucose 70 - 99 mg/dl 784  696  295   BUN 7.0 - 26.0 mg/dL 28.4  36  20   Creatinine 0.6 - 1.1 mg/dL 1.7  1.32  4.40   Sodium 136 - 145 mEq/L 139  137  134   Potassium 3.5 - 5.1 mEq/L 4.9  4.9  4.1   Chloride 98 - 107 mEq/L 107  103  100   CO2 22 - 29 mEq/L 25  24  24    Calcium 8.4 - 10.4 mg/dL 8.9  9.5  8.9   Total Protein 6.4 - 8.3 g/dL 7.0   7.4   Total Bilirubin 0.20 - 1.20 mg/dL 1.02   0.3   Alkaline Phos 40 - 150 U/L 51   59   AST 5 - 34 U/L 17   22   ALT 0 - 55 U/L 11   21     No results found for: "CHOL", "HDL", "LDLCALC", "LDLDIRECT", "TRIG", "CHOLHDL" No results for input(s): "LIPOA" in the last 8760 hours. No components found for: "NTPROBNP" No results for input(s): "PROBNP" in the last 8760 hours. No results for input(s): "TSH" in the last 8760 hours.  External Labs: Collected: 03/07/2023 [facility] database. Total cholesterol 141, triglycerides 183, HDL 40, LDL 70 Hemoglobin 72.5.  Collected: 02/13/2023 [facility] Database: A1c 8.7. BUN 40, creatinine 1.34  External Labs: Collected: March 2025 performed at [facility] provided by the patient. Total cholesterol 122, triglycerides 148, HDL 47, LDL calculated 50. BUN 35, creatinine 1.37. Sodium 142, potassium 4.9, chloride 102, bicarb 25. AST, ALT, alkaline phosphatase all within normal limits. A1c 9.6  Physical Exam:    Today's Vitals   09/14/23 1441  BP: 118/70  Pulse: 79  Resp: 16  SpO2: 98%  Weight: 201 lb (91.2 kg)  Height: 5\' 5"  (1.651 m)   Body mass index is 33.45 kg/m. Wt Readings from Last 3 Encounters:  09/14/23 201 lb (91.2 kg)  05/25/23 198 lb (89.8 kg)  04/18/23 198 lb (89.8 kg)    Physical Exam  Constitutional: No distress.  hemodynamically stable  Neck: No JVD present.  Cardiovascular: Normal rate, regular rhythm, S1 normal  and S2 normal. Exam reveals decreased pulses. Exam reveals no gallop, no S3 and no S4.  No murmur heard. Pulmonary/Chest: Effort normal and breath sounds normal. No stridor. She has no wheezes. She has no rales.  Abdominal: Soft. Bowel sounds are normal. She exhibits no distension. There is no abdominal tenderness.  Musculoskeletal:        General: Edema (trace) present.     Cervical back: Neck supple.  Neurological: She is alert and oriented to person, place, and time. She has  intact cranial nerves (2-12).  Skin: Skin is warm and moist.    Impression & Recommendation(s):  Impression:   ICD-10-CM   1. Coronary atherosclerosis due to calcified coronary lesion  I25.10    I25.84     2. Agatston CAC score, >400  R93.1     3. Atherosclerosis of aorta (HCC)  I70.0     4. Chronic heart failure with preserved ejection fraction (HFpEF) (HCC)  I50.32     5. Mixed hyperlipidemia  E78.2     6. Essential hypertension  I10     7. Type 2 diabetes mellitus with hyperglycemia, with long-term current use of insulin (HCC)  E11.65    Z79.4       Recommendation(s):  Coronary atherosclerosis due to calcified coronary lesion Agatston CAC score, >400 Atherosclerosis of aorta (HCC) Total CAC 728, 82nd percentile Continue aspirin and statin therapy as well as Zetia. LDL currently at goal Denies anginal chest pain. Stress test from October 2024 reported to be low risk study No change in physical endurance. Reemphasized the importance of secondary prevention with focus on improving her modifiable cardiovascular risk factors such as glycemic control, lipid management, blood pressure control, weight loss.  Chronic heart failure with preserved ejection fraction (HFpEF) (HCC) Stage B, NYHA class II Continue losartan 12.5 mg p.o. daily. Continue torsemide 20 mg p.o. as needed basis. Continue carvedilol 6.25 mg p.o. twice daily Physical examination trace pitting edema without decompensated heart  failure. Strict I's and O's, daily weights.  Mixed hyperlipidemia Currently on atorvastatin 80 mg p.o. daily.   Continue Zetia 10 mg p.o. daily Most recent lipid profile from [facility] from March 2025 independently reviewed, calculated LDL 50 mg/dL. She denies myalgia or other side effects.  Essential hypertension Blood pressures are well-controlled. Medications as discussed above  Type 2 diabetes mellitus with hyperglycemia, with long-term current use of insulin (HCC) Reemphasized importance of glycemic control.   Currently on insulin, ARB, statin, Zetia, and Tresiba Management per PCP.   Orders Placed:  No orders of the defined types were placed in this encounter.   Final Medication List:   No orders of the defined types were placed in this encounter.   Medications Discontinued During This Encounter  Medication Reason   ACCU-CHEK AVIVA PLUS test strip Patient Preference   Betamethasone Valerate 0.12 % foam Patient Preference   Cholecalciferol (VITAMIN D-400 PO) Patient Preference   glucose blood test strip Patient Preference   Lancets (ACCU-CHEK MULTICLIX) lancets Patient Preference   torsemide (DEMADEX) 20 MG tablet Patient Preference     Current Outpatient Medications:    aspirin EC 81 MG tablet, Take 1 tablet (81 mg total) by mouth daily. Swallow whole., Disp: , Rfl:    atorvastatin (LIPITOR) 80 MG tablet, Take 1 tablet (80 mg total) by mouth daily., Disp: 90 tablet, Rfl: 3   Blood Glucose Monitoring Suppl (BLOOD GLUCOSE MONITOR SYSTEM) w/Device KIT, Use as directed, Disp: 1 kit, Rfl: 0   carvedilol (COREG) 6.25 MG tablet, Take 1 tablet (6.25 mg total) by mouth 2 (two) times daily., Disp: 180 tablet, Rfl: 4   cephALEXin (KEFLEX) 500 MG capsule, Take 1 capsule (500 mg total) by mouth 2 (two) times daily., Disp: 10 capsule, Rfl: 0   ezetimibe (ZETIA) 10 MG tablet, Take 1 tablet (10 mg total) by mouth daily., Disp: 90 tablet, Rfl: 3   glucose blood  (ONETOUCH VERIO) test strip, Check blood sugar 2 (two) times daily., Disp: 200 strip, Rfl: 4  insulin degludec (TRESIBA FLEXTOUCH) 100 UNIT/ML FlexTouch Pen, Start by injecting 22 units under the skin once daily, then titrate as directed to a maximum daily dose of 50 units, Disp: 30 mL, Rfl: 4   Insulin Pen Needle 32G X 4 MM MISC, Use once a day., Disp: 100 each, Rfl: 4   Lancets (ONETOUCH DELICA PLUS LANCET33G) MISC, Use to check blood sugar (finger stick) once daily, Disp: 100 each, Rfl: 2   latanoprost (XALATAN) 0.005 % ophthalmic solution, Place 1 drop into both eyes at bedtime., Disp: 7.5 mL, Rfl: 2   losartan (COZAAR) 25 MG tablet, Take 0.5 tablets (12.5 mg total) by mouth at bedtime., Disp: 100 tablet, Rfl: 3   torsemide (DEMADEX) 20 MG tablet, Take 1-2 tablets (20-40 mg total) by mouth daily. (Patient taking differently: Take 20 mg by mouth daily.), Disp: 90 tablet, Rfl: 3  Consent:   N/A  Disposition:   1 year follow-up sooner if needed  Her questions and concerns were addressed to her satisfaction. She voices understanding of the recommendations provided during this encounter.    Signed, Tessa Lerner, DO, [facility]  [facility]  [address] [place], [place] [address]

## 2023-09-14 NOTE — Patient Instructions (Addendum)
 Medication Instructions:  Your physician recommends that you continue on your current medications as directed. Please refer to the Current Medication list given to you today.  *If you need a refill on your cardiac medications before your next appointment, please call your pharmacy*  Lab Work: None ordered today. If you have labs (blood work) drawn today and your tests are completely normal, you will receive your results only by: MyChart Message (if you have MyChart) OR A paper copy in the mail If you have any lab test that is abnormal or we need to change your treatment, we will call you to review the results.  Testing/Procedures: None ordered today.  Follow-Up: At [facility], you and your health needs are our priority.  As part of our continuing mission to provide you with exceptional heart care, we have created designated Provider Care Teams.  These Care Teams include your primary Cardiologist (physician) and Advanced Practice Providers (APPs -  Physician Assistants and Nurse Practitioners) who all work together to provide you with the care you need, when you need it.  We recommend signing up for the patient portal called "MyChart".  Sign up information is provided on this After Visit Summary.  MyChart is used to connect with patients for Virtual Visits (Telemedicine).  Patients are able to view lab/test results, encounter notes, upcoming appointments, etc.  Non-urgent messages can be sent to your provider as well.   To learn more about what you can do with MyChart, go to [email or web address].    Your next appointment:   1 year(s)  The format for your next appointment:   In Person  Provider:   Tessa Lerner, DO {     1st Floor: - Lobby - Registration  - Pharmacy  - Lab - Cafe  2nd Floor: - PV Lab - Diagnostic Testing (echo, CT, nuclear med)  3rd Floor: - Vacant  4th Floor: - TCTS (cardiothoracic surgery) - AFib Clinic - Structural Heart Clinic - Vascular  Surgery  - Vascular Ultrasound  5th Floor: - HeartCare Cardiology (general and EP) - Clinical Pharmacy for coumadin, hypertension, lipid, weight-loss medications, and med management appointments    Valet parking services will be available as well.

## 2023-09-24 ENCOUNTER — Encounter: Payer: Self-pay | Admitting: Cardiology

## 2023-09-25 ENCOUNTER — Other Ambulatory Visit (HOSPITAL_COMMUNITY): Payer: Self-pay

## 2023-10-04 ENCOUNTER — Other Ambulatory Visit (HOSPITAL_COMMUNITY): Payer: Self-pay

## 2023-10-13 ENCOUNTER — Other Ambulatory Visit: Payer: Self-pay | Admitting: Internal Medicine

## 2023-10-13 DIAGNOSIS — N6320 Unspecified lump in the left breast, unspecified quadrant: Secondary | ICD-10-CM

## 2023-10-16 ENCOUNTER — Other Ambulatory Visit: Payer: Self-pay

## 2023-10-30 ENCOUNTER — Ambulatory Visit

## 2023-10-30 ENCOUNTER — Ambulatory Visit
Admission: RE | Admit: 2023-10-30 | Discharge: 2023-10-30 | Disposition: A | Discharge status: Home / Self Care | Source: Ambulatory Visit | Attending: Internal Medicine | Admitting: Internal Medicine

## 2023-10-30 DIAGNOSIS — Z853 Personal history of malignant neoplasm of breast: Secondary | ICD-10-CM | POA: Diagnosis not present

## 2023-10-30 DIAGNOSIS — Z08 Encounter for follow-up examination after completed treatment for malignant neoplasm: Secondary | ICD-10-CM | POA: Diagnosis not present

## 2023-10-30 DIAGNOSIS — N6320 Unspecified lump in the left breast, unspecified quadrant: Secondary | ICD-10-CM

## 2023-11-07 DIAGNOSIS — L448 Other specified papulosquamous disorders: Secondary | ICD-10-CM | POA: Diagnosis not present

## 2023-11-07 DIAGNOSIS — D692 Other nonthrombocytopenic purpura: Secondary | ICD-10-CM | POA: Diagnosis not present

## 2023-11-07 DIAGNOSIS — L111 Transient acantholytic dermatosis [Grover]: Secondary | ICD-10-CM | POA: Diagnosis not present

## 2023-11-07 DIAGNOSIS — Z1283 Encounter for screening for malignant neoplasm of skin: Secondary | ICD-10-CM | POA: Diagnosis not present

## 2023-11-07 DIAGNOSIS — L738 Other specified follicular disorders: Secondary | ICD-10-CM | POA: Diagnosis not present

## 2023-11-07 DIAGNOSIS — D1801 Hemangioma of skin and subcutaneous tissue: Secondary | ICD-10-CM | POA: Diagnosis not present

## 2023-11-07 DIAGNOSIS — L565 Disseminated superficial actinic porokeratosis (DSAP): Secondary | ICD-10-CM | POA: Diagnosis not present

## 2023-11-07 DIAGNOSIS — L728 Other follicular cysts of the skin and subcutaneous tissue: Secondary | ICD-10-CM | POA: Diagnosis not present

## 2023-11-07 DIAGNOSIS — I788 Other diseases of capillaries: Secondary | ICD-10-CM | POA: Diagnosis not present

## 2023-11-07 DIAGNOSIS — L821 Other seborrheic keratosis: Secondary | ICD-10-CM | POA: Diagnosis not present

## 2023-11-08 DIAGNOSIS — H04123 Dry eye syndrome of bilateral lacrimal glands: Secondary | ICD-10-CM | POA: Diagnosis not present

## 2023-11-08 DIAGNOSIS — H402231 Chronic angle-closure glaucoma, bilateral, mild stage: Secondary | ICD-10-CM | POA: Diagnosis not present

## 2023-11-10 ENCOUNTER — Other Ambulatory Visit (HOSPITAL_COMMUNITY): Payer: Self-pay

## 2023-11-16 ENCOUNTER — Other Ambulatory Visit: Payer: Self-pay

## 2023-11-16 ENCOUNTER — Other Ambulatory Visit (HOSPITAL_COMMUNITY): Payer: Self-pay

## 2023-12-06 ENCOUNTER — Other Ambulatory Visit (HOSPITAL_COMMUNITY): Payer: Self-pay

## 2023-12-06 DIAGNOSIS — E1122 Type 2 diabetes mellitus with diabetic chronic kidney disease: Secondary | ICD-10-CM | POA: Diagnosis not present

## 2023-12-06 DIAGNOSIS — N1832 Chronic kidney disease, stage 3b: Secondary | ICD-10-CM | POA: Diagnosis not present

## 2023-12-06 DIAGNOSIS — E11319 Type 2 diabetes mellitus with unspecified diabetic retinopathy without macular edema: Secondary | ICD-10-CM | POA: Diagnosis not present

## 2023-12-06 DIAGNOSIS — I509 Heart failure, unspecified: Secondary | ICD-10-CM | POA: Diagnosis not present

## 2023-12-06 DIAGNOSIS — E1142 Type 2 diabetes mellitus with diabetic polyneuropathy: Secondary | ICD-10-CM | POA: Diagnosis not present

## 2023-12-18 ENCOUNTER — Other Ambulatory Visit (HOSPITAL_COMMUNITY): Payer: Self-pay

## 2023-12-18 ENCOUNTER — Other Ambulatory Visit: Payer: Self-pay

## 2023-12-28 ENCOUNTER — Other Ambulatory Visit (HOSPITAL_COMMUNITY): Payer: Self-pay

## 2023-12-28 ENCOUNTER — Other Ambulatory Visit: Payer: Self-pay

## 2023-12-28 MED ORDER — AMOXICILLIN 500 MG PO CAPS
500.0000 mg | ORAL_CAPSULE | Freq: Three times a day (TID) | ORAL | 0 refills | Status: AC
  Filled 2023-12-28: qty 21, 7d supply, fill #0

## 2023-12-29 ENCOUNTER — Other Ambulatory Visit: Payer: Self-pay

## 2023-12-29 ENCOUNTER — Other Ambulatory Visit (HOSPITAL_COMMUNITY): Payer: Self-pay

## 2024-01-22 ENCOUNTER — Other Ambulatory Visit (HOSPITAL_BASED_OUTPATIENT_CLINIC_OR_DEPARTMENT_OTHER): Payer: Self-pay

## 2024-01-22 ENCOUNTER — Other Ambulatory Visit (HOSPITAL_COMMUNITY): Payer: Self-pay

## 2024-01-23 ENCOUNTER — Other Ambulatory Visit (HOSPITAL_COMMUNITY): Payer: Self-pay

## 2024-01-23 ENCOUNTER — Other Ambulatory Visit: Payer: Self-pay

## 2024-01-23 MED ORDER — ONETOUCH VERIO VI STRP
ORAL_STRIP | 4 refills | Status: AC
  Filled 2024-01-23: qty 200, 100d supply, fill #0
  Filled 2024-04-28: qty 200, 100d supply, fill #1

## 2024-02-12 ENCOUNTER — Other Ambulatory Visit (HOSPITAL_COMMUNITY): Payer: Self-pay

## 2024-02-12 DIAGNOSIS — R600 Localized edema: Secondary | ICD-10-CM | POA: Diagnosis not present

## 2024-02-12 DIAGNOSIS — E785 Hyperlipidemia, unspecified: Secondary | ICD-10-CM | POA: Diagnosis not present

## 2024-02-12 DIAGNOSIS — I129 Hypertensive chronic kidney disease with stage 1 through stage 4 chronic kidney disease, or unspecified chronic kidney disease: Secondary | ICD-10-CM | POA: Diagnosis not present

## 2024-02-12 DIAGNOSIS — N1832 Chronic kidney disease, stage 3b: Secondary | ICD-10-CM | POA: Diagnosis not present

## 2024-02-12 DIAGNOSIS — E1122 Type 2 diabetes mellitus with diabetic chronic kidney disease: Secondary | ICD-10-CM | POA: Diagnosis not present

## 2024-02-23 ENCOUNTER — Other Ambulatory Visit (HOSPITAL_COMMUNITY): Payer: Self-pay

## 2024-03-06 ENCOUNTER — Other Ambulatory Visit (HOSPITAL_COMMUNITY): Payer: Self-pay

## 2024-03-07 ENCOUNTER — Other Ambulatory Visit: Payer: Self-pay

## 2024-03-07 ENCOUNTER — Other Ambulatory Visit (HOSPITAL_COMMUNITY): Payer: Self-pay

## 2024-03-07 MED ORDER — TORSEMIDE 20 MG PO TABS
20.0000 mg | ORAL_TABLET | Freq: Every day | ORAL | 3 refills | Status: DC
  Filled 2024-03-07: qty 90, 45d supply, fill #0
  Filled 2024-04-28: qty 90, 45d supply, fill #1

## 2024-03-08 DIAGNOSIS — E1122 Type 2 diabetes mellitus with diabetic chronic kidney disease: Secondary | ICD-10-CM | POA: Diagnosis not present

## 2024-03-08 DIAGNOSIS — I509 Heart failure, unspecified: Secondary | ICD-10-CM | POA: Diagnosis not present

## 2024-03-08 DIAGNOSIS — E11319 Type 2 diabetes mellitus with unspecified diabetic retinopathy without macular edema: Secondary | ICD-10-CM | POA: Diagnosis not present

## 2024-03-08 DIAGNOSIS — N1832 Chronic kidney disease, stage 3b: Secondary | ICD-10-CM | POA: Diagnosis not present

## 2024-03-08 DIAGNOSIS — E1142 Type 2 diabetes mellitus with diabetic polyneuropathy: Secondary | ICD-10-CM | POA: Diagnosis not present

## 2024-03-19 ENCOUNTER — Other Ambulatory Visit (HOSPITAL_COMMUNITY): Payer: Self-pay

## 2024-03-27 ENCOUNTER — Other Ambulatory Visit (HOSPITAL_COMMUNITY): Payer: Self-pay

## 2024-03-27 ENCOUNTER — Other Ambulatory Visit: Payer: Self-pay | Admitting: Internal Medicine

## 2024-03-27 DIAGNOSIS — R921 Mammographic calcification found on diagnostic imaging of breast: Secondary | ICD-10-CM

## 2024-04-04 ENCOUNTER — Ambulatory Visit

## 2024-04-11 ENCOUNTER — Other Ambulatory Visit (HOSPITAL_COMMUNITY): Payer: Self-pay

## 2024-04-11 ENCOUNTER — Other Ambulatory Visit: Payer: Self-pay

## 2024-04-26 ENCOUNTER — Ambulatory Visit
Admission: RE | Admit: 2024-04-26 | Discharge: 2024-04-26 | Disposition: A | Discharge status: Home / Self Care | Source: Ambulatory Visit | Attending: Internal Medicine | Admitting: Internal Medicine

## 2024-04-26 DIAGNOSIS — R928 Other abnormal and inconclusive findings on diagnostic imaging of breast: Secondary | ICD-10-CM | POA: Diagnosis not present

## 2024-04-26 DIAGNOSIS — R921 Mammographic calcification found on diagnostic imaging of breast: Secondary | ICD-10-CM

## 2024-04-29 ENCOUNTER — Other Ambulatory Visit (HOSPITAL_COMMUNITY): Payer: Self-pay

## 2024-05-06 ENCOUNTER — Telehealth: Payer: Self-pay | Admitting: Cardiology

## 2024-05-06 NOTE — Telephone Encounter (Signed)
 Pt c/o Shortness Of Breath: STAT if SOB developed within the last 24 hours or pt is noticeably SOB on the phone  1. Are you currently SOB (can you hear that pt is SOB on the phone)? Pt stated yes she is but I can't hear it over the phone.  2. How long have you been experiencing SOB? About a week  3. Are you SOB when sitting or when up moving around? both  4. Are you currently experiencing any other symptoms? No.

## 2024-05-06 NOTE — Telephone Encounter (Signed)
 Pt c/o Shortness Of Breath: STAT if SOB developed within the last 24 hours or pt is noticeably SOB on the phone   1. Are you currently SOB (can you hear that pt is SOB on the phone)? Pt stated yes she is but I can't hear it over the phone.   2. How long have you been experiencing SOB? About a week   3. Are you SOB when sitting or when up moving around? both   4. Are you currently experiencing any other symptoms? No.      I called the patient and she stated she is having difficulty taking deep breaths. This has been going on for about a week and the only symptoms she is experiencing is this heaviness in her chest and fatigue. She does not have a way to check her blood pressure, has no chest pain, lightheadedness or dizziness. She does have slight swelling in her ankles but she states that is a normal thing for her. Her daily weight has not changed as well. She also states that this heaviness and short of breath has been a constant feeling that doesn't go away with rest. She is taking her medications as prescribed. I gave her ED precautions, advised for her reach out to her PCP and will let Dr. Michele know to see if he advises anything further.

## 2024-05-07 DIAGNOSIS — R0600 Dyspnea, unspecified: Secondary | ICD-10-CM | POA: Diagnosis not present

## 2024-05-07 DIAGNOSIS — Z853 Personal history of malignant neoplasm of breast: Secondary | ICD-10-CM | POA: Diagnosis not present

## 2024-05-07 DIAGNOSIS — E1121 Type 2 diabetes mellitus with diabetic nephropathy: Secondary | ICD-10-CM | POA: Diagnosis not present

## 2024-05-07 DIAGNOSIS — R262 Difficulty in walking, not elsewhere classified: Secondary | ICD-10-CM | POA: Diagnosis not present

## 2024-05-07 DIAGNOSIS — I700 Atherosclerosis of aorta: Secondary | ICD-10-CM | POA: Diagnosis not present

## 2024-05-07 DIAGNOSIS — R600 Localized edema: Secondary | ICD-10-CM | POA: Diagnosis not present

## 2024-05-07 DIAGNOSIS — N1832 Chronic kidney disease, stage 3b: Secondary | ICD-10-CM | POA: Diagnosis not present

## 2024-05-07 DIAGNOSIS — I5032 Chronic diastolic (congestive) heart failure: Secondary | ICD-10-CM | POA: Diagnosis not present

## 2024-05-07 DIAGNOSIS — E1165 Type 2 diabetes mellitus with hyperglycemia: Secondary | ICD-10-CM | POA: Diagnosis not present

## 2024-05-07 DIAGNOSIS — Z5982 Transportation insecurity: Secondary | ICD-10-CM | POA: Diagnosis not present

## 2024-05-07 LAB — LAB REPORT - SCANNED: EGFR: 30

## 2024-05-07 NOTE — Telephone Encounter (Signed)
 Shortness of breath and chest tightness with other risk cardiac risk factors should be evaluated.  See if there is a DOD opening otherwise I would recommend going to ER for a quicker evaluation.   Dr. Meris Reede

## 2024-05-07 NOTE — Telephone Encounter (Signed)
 I spoke with the patient again and asked how her symptoms are and that Dr. Michele sent recommendations. Those were,  1. Shortness of breath and chest tightness with other risk cardiac risk factors should be evaluated.  2.See if there is a DOD opening otherwise I would recommend going to ER for a quicker evaluation.    Dr. Michele        The patient stated she has an appointment with her PCP today at 1:30 pm to see what she can do next. Her symptoms are the same and she states she is just tired. I gave her ED precautions and told her that if her PCP wants her to be seen by us  today, we can do that.

## 2024-05-07 NOTE — Telephone Encounter (Signed)
 Ok.  Thank-you  Dr. Michele

## 2024-05-08 ENCOUNTER — Other Ambulatory Visit: Payer: Self-pay | Admitting: Physician Assistant

## 2024-05-09 ENCOUNTER — Ambulatory Visit: Attending: Cardiology | Admitting: Cardiology

## 2024-05-09 ENCOUNTER — Other Ambulatory Visit: Payer: Self-pay

## 2024-05-09 ENCOUNTER — Encounter: Payer: Self-pay | Admitting: Cardiology

## 2024-05-09 ENCOUNTER — Other Ambulatory Visit (HOSPITAL_COMMUNITY): Payer: Self-pay

## 2024-05-09 VITALS — BP 138/78 | HR 83 | Resp 16 | Ht 65.0 in | Wt 199.2 lb

## 2024-05-09 DIAGNOSIS — R079 Chest pain, unspecified: Secondary | ICD-10-CM

## 2024-05-09 DIAGNOSIS — I5032 Chronic diastolic (congestive) heart failure: Secondary | ICD-10-CM

## 2024-05-09 DIAGNOSIS — R0602 Shortness of breath: Secondary | ICD-10-CM | POA: Diagnosis not present

## 2024-05-09 DIAGNOSIS — I2510 Atherosclerotic heart disease of native coronary artery without angina pectoris: Secondary | ICD-10-CM

## 2024-05-09 DIAGNOSIS — I2584 Coronary atherosclerosis due to calcified coronary lesion: Secondary | ICD-10-CM | POA: Diagnosis not present

## 2024-05-09 DIAGNOSIS — E1165 Type 2 diabetes mellitus with hyperglycemia: Secondary | ICD-10-CM

## 2024-05-09 DIAGNOSIS — R072 Precordial pain: Secondary | ICD-10-CM

## 2024-05-09 DIAGNOSIS — E782 Mixed hyperlipidemia: Secondary | ICD-10-CM

## 2024-05-09 DIAGNOSIS — R931 Abnormal findings on diagnostic imaging of heart and coronary circulation: Secondary | ICD-10-CM | POA: Diagnosis not present

## 2024-05-09 DIAGNOSIS — Z794 Long term (current) use of insulin: Secondary | ICD-10-CM | POA: Diagnosis not present

## 2024-05-09 DIAGNOSIS — I10 Essential (primary) hypertension: Secondary | ICD-10-CM | POA: Diagnosis not present

## 2024-05-09 DIAGNOSIS — I700 Atherosclerosis of aorta: Secondary | ICD-10-CM | POA: Diagnosis not present

## 2024-05-09 MED ORDER — EZETIMIBE 10 MG PO TABS
10.0000 mg | ORAL_TABLET | Freq: Every day | ORAL | 3 refills | Status: AC
  Filled 2024-05-09: qty 90, 90d supply, fill #0

## 2024-05-09 MED ORDER — ATORVASTATIN CALCIUM 80 MG PO TABS
80.0000 mg | ORAL_TABLET | Freq: Every day | ORAL | 3 refills | Status: AC
  Filled 2024-05-09: qty 90, 90d supply, fill #0

## 2024-05-09 NOTE — Patient Instructions (Signed)
 Medication Instructions:  Your physician recommends that you continue on your current medications as directed. Please refer to the Current Medication list given to you today.  *If you need a refill on your cardiac medications before your next appointment, please call your pharmacy*  Lab Work: D-dimer If you have labs (blood work) drawn today and your tests are completely normal, you will receive your results only by: MyChart Message (if you have MyChart) OR A paper copy in the mail If you have any lab test that is abnormal or we need to change your treatment, we will call you to review the results.  Testing/Procedures: ECHOCARDIOGRAM  CARDIAC PET CT  Follow-Up: At [facility], you and your health needs are our priority.  As part of our continuing mission to provide you with exceptional heart care, our providers are all part of one team.  This team includes your primary Cardiologist (physician) and Advanced Practice Providers or APPs (Physician Assistants and Nurse Practitioners) who all work together to provide you with the care you need, when you need it.  Your next appointment:   6 week(s)  Provider:   Madonna Large, DO    We recommend signing up for the patient portal called MyChart.  Sign up information is provided on this After Visit Summary.  MyChart is used to connect with patients for Virtual Visits (Telemedicine).  Patients are able to view lab/test results, encounter notes, upcoming appointments, etc.  Non-urgent messages can be sent to your provider as well.   To learn more about what you can do with MyChart, go to [email or web address].   Other Instructions Your physician has requested that you have an echocardiogram. Echocardiography is a painless test that uses sound waves to create images of your heart. It provides your doctor with information about the size and shape of your heart and how well your heart's chambers and valves are working. This procedure takes  approximately one hour. There are no restrictions for this procedure. Please do NOT wear cologne, perfume, aftershave, or lotions (deodorant is allowed). Please arrive 15 minutes prior to your appointment time.  Please note: We ask at that you not bring children with you during ultrasound (echo/ vascular) testing. Due to room size and safety concerns, children are not allowed in the ultrasound rooms during exams. Our front office staff cannot provide observation of children in our lobby area while testing is being conducted. An adult accompanying a patient to their appointment will only be allowed in the ultrasound room at the discretion of the ultrasound technician under special circumstances. We apologize for any inconvenience.      Please report to Radiology at the [facility] Main Entrance 30 minutes early for your test.  [address] [place], [place] [address]   How to Prepare for Your Cardiac PET/CT Stress Test:  Nothing to eat or drink, except water, 3 hours prior to arrival time.  NO caffeine/decaffeinated products, or chocolate 12 hours prior to arrival. (Please note decaffeinated beverages (teas/coffees) still contain caffeine).  If you have caffeine within 12 hours prior, the test will need to be rescheduled.   Diabetic Preparation: If able to eat breakfast prior to 3 hour fasting, you may take all medications, including your insulin . Do not worry if you miss your breakfast dose of insulin  - start at your next meal. If you do not eat prior to 3 hour fast-Hold all diabetes (oral and insulin ) medications. Patients who wear a continuous glucose monitor MUST remove  the device prior to scanning.  You may take your remaining medications with water.  NO perfume, cologne or lotion on chest or abdomen area. FEMALES - Please avoid wearing dresses to this appointment.  Total time is 1 to 2 hours; you may want to bring reading material for the waiting time.   In  preparation for your appointment, medication and supplies will be purchased.  Appointment availability is limited, so if you need to cancel or reschedule, please call the Radiology Department Scheduler at [phone] 24 hours in advance to avoid a cancellation fee of $100.00  What to Expect When you Arrive:  Once you arrive and check in for your appointment, you will be taken to a preparation room within the Radiology Department.  A technologist or Nurse will obtain your medical history, verify that you are correctly prepped for the exam, and explain the procedure.  Afterwards, an IV will be started in your arm and electrodes will be placed on your skin for EKG monitoring during the stress portion of the exam. Then you will be escorted to the PET/CT scanner.  There, staff will get you positioned on the scanner and obtain a blood pressure and EKG.  During the exam, you will continue to be connected to the EKG and blood pressure machines.  A small, safe amount of a radioactive tracer will be injected in your IV to obtain a series of pictures of your heart along with an injection of a stress agent.    After your Exam:  It is recommended that you eat a meal and drink a caffeinated beverage to counter act any effects of the stress agent.  Drink plenty of fluids for the remainder of the day and urinate frequently for the first couple of hours after the exam.  Your doctor will inform you of your test results within 7-10 business days.  For more information and frequently asked questions, please visit our website: [email or web address]  For questions about your test or how to prepare for your test, please call: Cardiac Imaging Nurse Navigators Office: [phone]

## 2024-05-09 NOTE — Progress Notes (Signed)
 Cardiology Office Note:  .   ID:  Brittney Wright, DOB [birth year], MRN [id number] PCP:  Elliot Charm, MD  Former Cardiology Providers: Dr. Annalee Casa [facility] Providers Cardiologist:  Madonna Large, DO , [facility] (established care 04/05/23) Electrophysiologist:  None  Click to update primary MD,subspecialty MD or APP then REFRESH:1}    Chief Complaint  Patient presents with   Follow-up    Sick visit - chest tightness and Shortness of breath    History of Present Illness: .   Brittney Wright is a 89 y.o. Caucasian female whose past medical history and cardiovascular risk factors includes: Severe coronary artery calcification, aortic atherosclerosis, hypertension, hyperlipidemia, IDDM Type II, obesity due to excess calories.  Started seeing the patient after she transitioned care in October 2024.  At the last office visit due to her dyspnea on exertion there was concern that she may be developing HFpEF.  The patient thought that her symptoms are likely secondary to increased anxiety.  Patient was recommended to undergo coronary calcium  score for further risk stratification.  She was noted to have severe CAC with a total score of 728 placing her at the 82nd percentile.  She underwent nuclear stress test which was reported to be low risk.  Patient was last seen in March 2025 at that time she was doing well from a cardiovascular standpoint.  She called the office several days back endorsing concerns for chest tightness and shortness of breath.  She comes in today for sick visit.  In the interim she followed up with her PCP and had chest x-ray and labs done.  I do not have the results of the chest x-ray.  Labs are noted below.  Shortness of breath: Onset approximately 2 weeks ago. Progressive. She increased her torsemide  to 40 mg p.o. daily for 3 to 4 days with no significant improvement. Has unexplained lower extremity swelling. Denies orthopnea or PND. Labs  performed by PCP reviewed as part of today's encounter  Chest tightness: Pressure-like sensation. Substernally. Intensity 3 out of 10. Ongoing for the last 1 week but nonprogressive. Not brought on by effort related activities. Does not resolve with rest. Self-limited  Her sleep is disturbed by difficulty sleeping, frequent urination.  She sleeps on her side and uses 1 pillow which is her routine.  She denies any recent surgery, prolonged travel, no prior history of DVT or PE. She denies URI symptoms or sick contacts  Review of Systems: .   Review of Systems  Cardiovascular:  Positive for chest pain, dyspnea on exertion and leg swelling. Negative for claudication, irregular heartbeat, near-syncope, orthopnea, palpitations, paroxysmal nocturnal dyspnea and syncope.  Hematologic/Lymphatic: Negative for bleeding problem.  Musculoskeletal:  Negative for muscle cramps and myalgias.  Neurological:  Negative for dizziness and light-headedness.  Psychiatric/Behavioral:  The patient is nervous/anxious.     Studies Reviewed:   EKG Interpretation Date/Time:  Thursday May 09 2024 15:41:38 EST Ventricular Rate:  78 PR Interval:  166 QRS Duration:  76 QT Interval:  406 QTC Calculation: 462 R Axis:   76  Text Interpretation: Normal sinus rhythm Normal ECG When compared with ECG of 05-Apr-2023 14:07, No significant change was found Confirmed by Large Madonna [phone]) on 05/09/2024 3:54:57 PM  Echocardiogram:  06/22/2022:   Normal LV systolic function with visual EF 60-65%. Left ventricle cavity is normal in size. Normal left ventricular wall thickness. Normal global wall motion. Doppler evidence of grade I (impaired) diastolic dysfunction, normal LAP. Calculated EF 69%. Structurally  normal tricuspid valve.  Mild tricuspid regurgitation. No evidence of pulmonary hypertension. No significant change compared to 11/2017.   Stress Testing: MPI 04/18/2023:   The study is normal. The study  is low risk.   No ST deviation was noted.   Left ventricular function is normal. Nuclear stress EF: 68%. The left ventricular ejection fraction is hyperdynamic (>65%). End diastolic cavity size is normal.  Coronary Calcium  Score: 05/10/2023 Left main: 46.4 Left anterior descending artery: 303 Left circumflex artery: 148 Right coronary artery: 231 Total: 728, Percentile: 82nd Radiology Overread: Aortic atherosclerosis  RADIOLOGY: NA  Risk Assessment/Calculations:   NA   Labs:       Latest Ref Rng & Units 06/18/2012   10:39 AM 03/01/2012    9:47 AM 02/03/2012   10:43 AM  CBC  WBC 3.9 - 10.3 10e3/uL 4.8  7.4    Hemoglobin 11.6 - 15.9 g/dL 88.6  88.4  89.9   Hematocrit 34.8 - 46.6 % 34.4  36.1    Platelets 145 - 400 10e3/uL 231  273         Latest Ref Rng & Units 06/18/2012   10:39 AM 01/31/2012   10:30 AM 01/18/2012    8:46 AM  BMP  Glucose 70 - 99 mg/dl 828  769  709   BUN 7.0 - 26.0 mg/dL 69.9  36  20   Creatinine 0.6 - 1.1 mg/dL 1.7  8.45  8.31   Sodium 136 - 145 mEq/L 139  137  134   Potassium 3.5 - 5.1 mEq/L 4.9  4.9  4.1   Chloride 98 - 107 mEq/L 107  103  100   CO2 22 - 29 mEq/L 25  24  24    Calcium  8.4 - 10.4 mg/dL 8.9  9.5  8.9       Latest Ref Rng & Units 06/18/2012   10:39 AM 01/31/2012   10:30 AM 01/18/2012    8:46 AM  CMP  Glucose 70 - 99 mg/dl 828  769  709   BUN 7.0 - 26.0 mg/dL 69.9  36  20   Creatinine 0.6 - 1.1 mg/dL 1.7  8.45  8.31   Sodium 136 - 145 mEq/L 139  137  134   Potassium 3.5 - 5.1 mEq/L 4.9  4.9  4.1   Chloride 98 - 107 mEq/L 107  103  100   CO2 22 - 29 mEq/L 25  24  24    Calcium  8.4 - 10.4 mg/dL 8.9  9.5  8.9   Total Protein 6.4 - 8.3 g/dL 7.0   7.4   Total Bilirubin 0.20 - 1.20 mg/dL 9.69   0.3   Alkaline Phos 40 - 150 U/L 51   59   AST 5 - 34 U/L 17   22   ALT 0 - 55 U/L 11   21     No results found for: CHOL, HDL, LDLCALC, LDLDIRECT, TRIG, CHOLHDL No results for input(s): LIPOA in the last 8760 hours. No  components found for: NTPROBNP No results for input(s): PROBNP in the last 8760 hours. No results for input(s): TSH in the last 8760 hours.  External Labs: Collected: 03/07/2023 [facility] database. Total cholesterol 141, triglycerides 183, HDL 40, LDL 70 Hemoglobin 87.8.  Collected: 02/13/2023 [facility] Database: A1c 8.7. BUN 40, creatinine 1.34  External Labs: Collected: March 2025 performed at [facility] provided by the patient. Total cholesterol 122, triglycerides 148, HDL 47, LDL calculated 50. BUN 35, creatinine 1.37. Sodium 142, potassium  4.9, chloride 102, bicarb 25. AST, ALT, alkaline phosphatase all within normal limits. A1c 9.6  External Labs: Collected: 05/07/2024 [facility]. BUN 47, creatinine 1.64. Sodium 139, potassium 4.7, chloride 100, bicarb 31. Hemoglobin 12.9, hematocrit 38% BNP 89.5  Physical Exam:    Today's Vitals   05/09/24 1538  BP: 138/78  Pulse: 83  Resp: 16  SpO2: 96%  Height: 5' 5 (1.651 m)   Body mass index is 33.45 kg/m. Wt Readings from Last 3 Encounters:  09/14/23 201 lb (91.2 kg)  05/25/23 198 lb (89.8 kg)  04/18/23 198 lb (89.8 kg)    Physical Exam  Constitutional: No distress.  hemodynamically stable  Neck: No JVD present.  Cardiovascular: Normal rate, regular rhythm, S1 normal and S2 normal. Exam reveals decreased pulses. Exam reveals no gallop, no S3 and no S4.  No murmur heard. Pulmonary/Chest: Effort normal. No stridor. She has no wheezes. She has no rales.  Decreased breath sounds bilaterally.  Abdominal: Soft. Bowel sounds are normal. She exhibits no distension. There is no abdominal tenderness.  Musculoskeletal:        General: Edema present.     Cervical back: Neck supple.  Neurological: She is alert and oriented to person, place, and time. She has intact cranial nerves (2-12).  Skin: Skin is warm and moist.    Impression & Recommendation(s):  Impression:   ICD-10-CM   1. Precordial pain  R07.2  ECHOCARDIOGRAM COMPLETE    NM PET CT CARDIAC PERFUSION MULTI W/ABSOLUTE BLOODFLOW    2. Coronary atherosclerosis due to calcified coronary lesion  I25.10 EKG 12-Lead   I25.84     3. Agatston CAC score, >400  R93.1     4. Atherosclerosis of aorta  I70.0     5. SOB (shortness of breath)  R06.02 D-Dimer, Quantitative    6. Chronic heart failure with preserved ejection fraction (HFpEF) (HCC)  I50.32     7. Mixed hyperlipidemia  E78.2     8. Essential hypertension  I10     9. Type 2 diabetes mellitus with hyperglycemia, with long-term current use of insulin  (HCC)  E11.65    Z79.4       Recommendation(s):  Precordial pain Coronary atherosclerosis due to calcified coronary lesion Agatston CAC score, >400 Atherosclerosis of aorta (HCC) Total CAC 728, 82nd percentile Continue aspirin  and statin therapy as well as Zetia . EKG nonischemic No active pain Symptoms of chest tightness/pressure and shortness of breath could be cardiac Has multiple cardiovascular risk factors Echo will be ordered to evaluate for structural heart disease and left ventricular systolic function. Discussed ischemic workup including both invasive and noninvasive approaches.  In a shared decision was to proceed with Cardiac PET/CT to evaluate for reversible ischemia-in the setting of severe CAC.  Risks, benefits, alternatives discussed.  Shortness of breath Chronic heart failure with preserved ejection fraction (HFpEF) (HCC) Stage B, NYHA class II Continue losartan  12.5 mg p.o. daily. Continue torsemide  20 mg p.o. as needed basis. Monitor weight daily; increase torsemide  to 40 mg if weight increases by 1 pound in a day or 3 pounds in a week until weight returns to baseline. Continue carvedilol  6.25 mg p.o. twice daily Patient is edematous on physical examination but her shortness of breath is out of proportion to volume overload. Her BNP checked at PCPs office is within normal limits No prior history of  DVT/PE. However, given the acuity of symptoms cannot entirely rule it out To avoid unneeded contrast exposure will start with checking a D-dimer.  If elevated patient understands that she will need to have both DVT and PE studies  Mixed hyperlipidemia Currently on atorvastatin  80 mg p.o. daily.   Continue Zetia  10 mg p.o. daily She denies myalgia or other side effects.  Essential hypertension Blood pressures are well-controlled. Medications as discussed above  Type 2 diabetes mellitus with hyperglycemia, with long-term current use of insulin  (HCC) Reemphasized importance of glycemic control.   Currently on insulin , ARB, statin, Zetia , and Tresiba  Management per PCP.  In the interim, if her symptoms increased in intensity, duration, or frequency patient is advised to seek medical attention by going to ER for more expedited evaluation as opposed to outpatient elective workup.  Patient verbalized understanding  Orders Placed:  Orders Placed This Encounter  Procedures   NM PET CT CARDIAC PERFUSION MULTI W/ABSOLUTE BLOODFLOW    If indicated for the ordered procedure, I authorize the administration of a radiopharmaceutical per Radiology protocol:   Yes    Preferred Imaging Location:   [facility]   D-Dimer, Quantitative   Lab report - scanned   EKG 12-Lead   ECHOCARDIOGRAM COMPLETE    Where [facility]    Does the patient weigh less than or greater than 250 lbs?:   Patient weighs less than 250 lbs    Perflutren DEFINITY (image enhancing agent) should be administered unless hypersensitivity or allergy exist:   Administer Perflutren    Reason for exam-Echo:   Chest Pain  R07.9    Final Medication List:    Meds ordered this encounter  Medications   torsemide  (DEMADEX ) 20 MG tablet    Sig: Take 1 tablet (20 mg total) by mouth daily.    Medications Discontinued During This Encounter  Medication Reason   torsemide  (DEMADEX ) 20 MG  tablet       Current Outpatient Medications:    amoxicillin  (AMOXIL ) 500 MG capsule, Take 1 capsule three times per day for seven days, Disp: 21 capsule, Rfl: 0   aspirin  EC 81 MG tablet, Take 1 tablet (81 mg total) by mouth daily. Swallow whole., Disp: , Rfl:    atorvastatin  (LIPITOR) 80 MG tablet, Take 1 tablet (80 mg total) by mouth daily., Disp: 90 tablet, Rfl: 3   Blood Glucose Monitoring Suppl (BLOOD GLUCOSE MONITOR SYSTEM) w/Device KIT, Use as directed, Disp: 1 kit, Rfl: 0   carvedilol  (COREG ) 6.25 MG tablet, Take 1 tablet (6.25 mg total) by mouth 2 (two) times daily., Disp: 180 tablet, Rfl: 4   cephALEXin  (KEFLEX ) 500 MG capsule, Take 1 capsule (500 mg total) by mouth 2 (two) times daily., Disp: 10 capsule, Rfl: 0   ezetimibe  (ZETIA ) 10 MG tablet, Take 1 tablet (10 mg total) by mouth daily., Disp: 90 tablet, Rfl: 3   glucose blood (ONETOUCH VERIO) test strip, Check blood sugar 2 (two) times daily., Disp: 200 strip, Rfl: 4   insulin  degludec (TRESIBA  FLEXTOUCH) 100 UNIT/ML FlexTouch Pen, Start by injecting 22 units under the skin once daily, then titrate as directed to a maximum daily dose of 50 units, Disp: 30 mL, Rfl: 4   Insulin  Pen Needle 32G X 4 MM MISC, Use once a day., Disp: 100 each, Rfl: 4   Lancets (ONETOUCH DELICA PLUS LANCET33G) MISC, Use to check blood sugar (finger stick) once daily, Disp: 100 each, Rfl: 2   latanoprost  (XALATAN ) 0.005 % ophthalmic solution, Place 1 drop into both eyes at bedtime., Disp: 7.5 mL, Rfl: 2  losartan  (COZAAR ) 25 MG tablet, Take 0.5 tablets (12.5 mg total) by mouth at bedtime., Disp: 100 tablet, Rfl: 3   torsemide  (DEMADEX ) 20 MG tablet, Take 1 tablet (20 mg total) by mouth daily., Disp: , Rfl:   Consent:    Informed Consent   Shared Decision Making/Informed Consent The risks [chest pain, shortness of breath, cardiac arrhythmias, dizziness, blood pressure fluctuations, myocardial infarction, stroke/transient ischemic attack, nausea, vomiting,  allergic reaction, radiation exposure, metallic taste sensation and life-threatening complications (estimated to be 1 in 10,000)], benefits (risk stratification, diagnosing coronary artery disease, treatment guidance) and alternatives of a cardiac PET stress test were discussed in detail with Ms. Sianez and she agrees to proceed.     Disposition:   6 week follow up   Her questions and concerns were addressed to her satisfaction. She voices understanding of the recommendations provided during this encounter.    Signed, Madonna Michele HAS, [facility]  A Division of [facility] [address] [place], [place] [address]

## 2024-05-10 ENCOUNTER — Emergency Department (EMERGENCY_DEPARTMENT_HOSPITAL)

## 2024-05-10 ENCOUNTER — Ambulatory Visit: Payer: Self-pay | Admitting: Cardiology

## 2024-05-10 ENCOUNTER — Encounter (HOSPITAL_COMMUNITY): Payer: Self-pay

## 2024-05-10 ENCOUNTER — Emergency Department (HOSPITAL_COMMUNITY)

## 2024-05-10 ENCOUNTER — Encounter: Payer: Self-pay | Admitting: Cardiology

## 2024-05-10 ENCOUNTER — Other Ambulatory Visit: Payer: Self-pay

## 2024-05-10 ENCOUNTER — Emergency Department (HOSPITAL_COMMUNITY)
Admission: EM | Admit: 2024-05-10 | Discharge: 2024-05-10 | Disposition: A | Discharge status: Home / Self Care | Attending: Emergency Medicine | Admitting: Emergency Medicine

## 2024-05-10 DIAGNOSIS — R600 Localized edema: Secondary | ICD-10-CM | POA: Insufficient documentation

## 2024-05-10 DIAGNOSIS — I5033 Acute on chronic diastolic (congestive) heart failure: Secondary | ICD-10-CM | POA: Diagnosis not present

## 2024-05-10 DIAGNOSIS — I130 Hypertensive heart and chronic kidney disease with heart failure and stage 1 through stage 4 chronic kidney disease, or unspecified chronic kidney disease: Secondary | ICD-10-CM | POA: Diagnosis not present

## 2024-05-10 DIAGNOSIS — M7989 Other specified soft tissue disorders: Secondary | ICD-10-CM | POA: Diagnosis not present

## 2024-05-10 DIAGNOSIS — Z794 Long term (current) use of insulin: Secondary | ICD-10-CM | POA: Insufficient documentation

## 2024-05-10 DIAGNOSIS — R0602 Shortness of breath: Secondary | ICD-10-CM

## 2024-05-10 DIAGNOSIS — I509 Heart failure, unspecified: Secondary | ICD-10-CM | POA: Insufficient documentation

## 2024-05-10 DIAGNOSIS — Z7982 Long term (current) use of aspirin: Secondary | ICD-10-CM | POA: Diagnosis not present

## 2024-05-10 DIAGNOSIS — E1122 Type 2 diabetes mellitus with diabetic chronic kidney disease: Secondary | ICD-10-CM | POA: Diagnosis not present

## 2024-05-10 DIAGNOSIS — I110 Hypertensive heart disease with heart failure: Secondary | ICD-10-CM | POA: Diagnosis not present

## 2024-05-10 DIAGNOSIS — N1832 Chronic kidney disease, stage 3b: Secondary | ICD-10-CM | POA: Diagnosis not present

## 2024-05-10 DIAGNOSIS — R7989 Other specified abnormal findings of blood chemistry: Secondary | ICD-10-CM | POA: Diagnosis not present

## 2024-05-10 DIAGNOSIS — R918 Other nonspecific abnormal finding of lung field: Secondary | ICD-10-CM | POA: Diagnosis not present

## 2024-05-10 LAB — BASIC METABOLIC PANEL WITH GFR
Anion gap: 14 (ref 5–15)
BUN: 46 mg/dL — ABNORMAL HIGH (ref 8–23)
CO2: 23 mmol/L (ref 22–32)
Calcium: 9.2 mg/dL (ref 8.9–10.3)
Chloride: 100 mmol/L (ref 98–111)
Creatinine, Ser: 1.75 mg/dL — ABNORMAL HIGH (ref 0.44–1.00)
GFR, Estimated: 28 mL/min — ABNORMAL LOW (ref >60)
Glucose, Bld: 182 mg/dL — ABNORMAL HIGH (ref 70–99)
Potassium: 4.6 mmol/L (ref 3.5–5.1)
Sodium: 137 mmol/L (ref 135–145)

## 2024-05-10 LAB — BRAIN NATRIURETIC PEPTIDE: B Natriuretic Peptide: 81 pg/mL (ref 0.0–100.0)

## 2024-05-10 LAB — CBC WITH DIFFERENTIAL/PLATELET
Abs Immature Granulocytes: 0.03 K/uL (ref 0.00–0.07)
Basophils Absolute: 0.1 K/uL (ref 0.0–0.1)
Basophils Relative: 1 %
Eosinophils Absolute: 0.2 K/uL (ref 0.0–0.5)
Eosinophils Relative: 2 %
HCT: 39.9 % (ref 36.0–46.0)
Hemoglobin: 12.9 g/dL (ref 12.0–15.0)
Immature Granulocytes: 0 %
Lymphocytes Relative: 19 %
Lymphs Abs: 1.7 K/uL (ref 0.7–4.0)
MCH: 29.4 pg (ref 26.0–34.0)
MCHC: 32.3 g/dL (ref 30.0–36.0)
MCV: 90.9 fL (ref 80.0–100.0)
Monocytes Absolute: 0.7 K/uL (ref 0.1–1.0)
Monocytes Relative: 8 %
Neutro Abs: 6.3 K/uL (ref 1.7–7.7)
Neutrophils Relative %: 70 %
Platelets: 274 K/uL (ref 150–400)
RBC: 4.39 MIL/uL (ref 3.87–5.11)
RDW: 12.6 % (ref 11.5–15.5)
WBC: 9 K/uL (ref 4.0–10.5)
nRBC: 0 % (ref 0.0–0.2)

## 2024-05-10 LAB — TROPONIN I (HIGH SENSITIVITY): Troponin I (High Sensitivity): 6 ng/L (ref <18)

## 2024-05-10 LAB — URINALYSIS, ROUTINE W REFLEX MICROSCOPIC
Bilirubin Urine: NEGATIVE
Glucose, UA: NEGATIVE mg/dL
Hgb urine dipstick: NEGATIVE
Ketones, ur: NEGATIVE mg/dL
Leukocytes,Ua: NEGATIVE
Nitrite: NEGATIVE
Protein, ur: NEGATIVE mg/dL
Specific Gravity, Urine: 1.006 (ref 1.005–1.030)
pH: 5 (ref 5.0–8.0)

## 2024-05-10 LAB — D-DIMER, QUANTITATIVE
D-DIMER: 0.85 mg{FEU}/L — ABNORMAL HIGH (ref 0.00–0.49)
D-DIMER: 0.97 mg{FEU}/L — ABNORMAL HIGH (ref 0.00–0.49)

## 2024-05-10 MED ORDER — IOHEXOL 350 MG/ML SOLN
75.0000 mL | Freq: Once | INTRAVENOUS | Status: AC | PRN
  Administered 2024-05-10: 75 mL via INTRAVENOUS

## 2024-05-10 MED ORDER — FUROSEMIDE 10 MG/ML IJ SOLN
40.0000 mg | Freq: Once | INTRAMUSCULAR | Status: DC
  Filled 2024-05-10: qty 4

## 2024-05-10 MED ORDER — TORSEMIDE 20 MG PO TABS: 20.0000 mg | ORAL_TABLET | Freq: Every day | ORAL | Status: AC

## 2024-05-10 NOTE — Progress Notes (Signed)
 VASCULAR LAB    Bilateral llower extremity venous duplex has been performed.  See CV proc for preliminary results.  Gave verbal report to Dr. Patt LIS, [facility], RVT 05/10/2024, 4:02 PM

## 2024-05-10 NOTE — Discharge Instructions (Addendum)
 I discussed the plan for discharge with the patient and/or their surrogate at bedside prior to discharge and they were in agreement with the plan and verbalized understanding of the return precautions provided. All questions answered to the best of my ability. Ultimately, the patient was discharged in stable condition with stable vital signs. I am reassured that they are capable of close follow up and good social support at home.   I would recommend follow-up with your PCP to discuss further diuresis treatment.  I would recommend increasing your torsemide  to twice daily, however if you do not desire to do this at this time I would recommend talking with your PCP about increasing your torsemide  to be able to provide appropriate diuresis treatment.

## 2024-05-10 NOTE — Progress Notes (Signed)
 Error in lab with processing. Called patient to explain we needed a re-draw. Patient understood to go to any [facility] today for re-draw.

## 2024-05-10 NOTE — ED Provider Notes (Signed)
 Patient has a history of CKD stage IIIb, creatinine baseline is 1.6, creatinine today is 1.7 without evidence of AKI.  Patient with a GFR of 28, which is close to patient baseline.  Per protocol, patient requires a note indicating that patient is safe to receive contrast at this time.  Patient has a positive D-dimer per outpatient testing of 970, which is elevated higher than age-adjusted levels.  Patient will require IV contrast to be able to evaluate for PE at this time.  I discussed the risk and benefits of undergoing contrast induced evaluation today with the patient, patient is agreeable to undergoing this testing.  Overall, patient is stable to undergo contrast induced evaluation at this time.   Arlee Katz, MD 05/10/24 1629    Patt Alm Macho, MD 05/11/24 [phone]

## 2024-05-10 NOTE — ED Provider Notes (Signed)
 [place] EMERGENCY DEPARTMENT AT [facility] Provider Note   CSN: [id number] Arrival date & time: 05/10/24  1402     Patient presents with: Shortness of Breath and Leg Swelling   Brittney Wright is a 89 y.o. female.  {Add pertinent medical, surgical, social history, OB history to HPI:32947} Hx of Severe coronary artery calcification, aortic atherosclerosis, hypertension, hyperlipidemia, IDDM Type II, obesity presenting with SOB for 3 weeks.  History per patient.  Reports that she has been having shortness of breath starting about 3 weeks ago, has been pretty stable over this time.  Went to see her cardiologist, was evaluated yesterday, received D-dimer which was negative with age adjustment, however repeat D-dimer was performed today and was positive (970), therefore sent to the ED to receive imaging.  Denies chest pain, denies worsening shortness of breath with exertion, states that shortness of breath primarily exacerbated with lying flat.  Has been taking torsemide  20 mg twice daily previously, however over the past week has reduced down to 20 mg daily because she says that she has been peeing too much.  Denies any fever or chills.  Denies nausea or vomiting.  Denies abdominal pain.   Shortness of Breath      Prior to Admission medications   Medication Sig Start Date End Date Taking? Authorizing Provider  amoxicillin  (AMOXIL ) 500 MG capsule Take 1 capsule three times per day for seven days 12/28/23     aspirin  EC 81 MG tablet Take 1 tablet (81 mg total) by mouth daily. Swallow whole. 05/25/23   Lucien Orren SAILOR, PA-C  atorvastatin  (LIPITOR) 80 MG tablet Take 1 tablet (80 mg total) by mouth daily. 05/09/24   Lucien Orren SAILOR, PA-C  Blood Glucose Monitoring Suppl (BLOOD GLUCOSE MONITOR SYSTEM) w/Device KIT Use as directed 07/17/23     carvedilol  (COREG ) 6.25 MG tablet Take 1 tablet (6.25 mg total) by mouth 2 (two) times daily. 03/27/23     cephALEXin  (KEFLEX ) 500 MG capsule Take  1 capsule (500 mg total) by mouth 2 (two) times daily. 09/05/23     ezetimibe  (ZETIA ) 10 MG tablet Take 1 tablet (10 mg total) by mouth daily. 05/09/24 08/07/24  Lucien Orren SAILOR, PA-C  glucose blood (ONETOUCH VERIO) test strip Check blood sugar 2 (two) times daily. 01/23/24     insulin  degludec (TRESIBA  FLEXTOUCH) 100 UNIT/ML FlexTouch Pen Start by injecting 22 units under the skin once daily, then titrate as directed to a maximum daily dose of 50 units 08/16/23     Insulin  Pen Needle 32G X 4 MM MISC Use once a day. 08/16/23   Faythe Purchase, MD  Lancets [facility] DELICA PLUS [place]) MISC Use to check blood sugar (finger stick) once daily 04/19/22   Faythe Purchase, MD  latanoprost  (XALATAN ) 0.005 % ophthalmic solution Place 1 drop into both eyes at bedtime. 06/02/23     losartan  (COZAAR ) 25 MG tablet Take 0.5 tablets (12.5 mg total) by mouth at bedtime. 08/03/23 06/03/24  Tolia, Sunit, DO  torsemide  (DEMADEX ) 20 MG tablet Take 1 tablet (20 mg total) by mouth daily. 05/10/24   Tolia, Sunit, DO    Allergies: Clobetasol propionate, Iodinated contrast media, Minocycline, Nystatin, Sulfamethoxazole-trimethoprim, and Cephalexin     Review of Systems  Respiratory:  Positive for shortness of breath.     Updated Vital Signs BP (!) 148/62 (BP Location: Left Arm)   Pulse 74   Temp 98.2 F (36.8 C)   Resp 16   Ht 5' 5 (1.651 m)  Wt 90.3 kg   SpO2 97%   BMI 33.12 kg/m   Physical Exam Vitals and nursing note reviewed.  Constitutional:      General: She is not in acute distress.    Appearance: She is well-developed. She is not ill-appearing.  HENT:     Head: Normocephalic and atraumatic.  Eyes:     Conjunctiva/sclera: Conjunctivae normal.  Neck:     Vascular: JVD present.     Comments: JVD extending up to earlobe bilaterally. Cardiovascular:     Rate and Rhythm: Normal rate and regular rhythm.     Heart sounds: No murmur heard. Pulmonary:     Effort: Pulmonary effort is normal. No respiratory  distress.     Breath sounds: Normal breath sounds. No decreased breath sounds, wheezing, rhonchi or rales.  Abdominal:     Palpations: Abdomen is soft.     Tenderness: There is no abdominal tenderness.  Musculoskeletal:        General: No swelling.     Cervical back: Neck supple.     Right lower leg: Edema present.     Left lower leg: Edema present.     Comments: 3+ pitting edema bilateral lower extremities.  Skin:    General: Skin is warm and dry.     Capillary Refill: Capillary refill takes less than 2 seconds.  Neurological:     General: No focal deficit present.     Mental Status: She is alert and oriented to person, place, and time.  Psychiatric:        Mood and Affect: Mood normal.     (all labs ordered are listed, but only abnormal results are displayed) Labs Reviewed  BASIC METABOLIC PANEL WITH GFR  CBC WITH DIFFERENTIAL/PLATELET  URINALYSIS, ROUTINE W REFLEX MICROSCOPIC    EKG: None  Radiology: No results found.  {Document cardiac monitor, telemetry assessment procedure when appropriate:32947} Procedures   Medications Ordered in the ED - No data to display    {Click here for ABCD2, HEART and other calculators REFRESH Note before signing:1}                              Medical Decision Making Amount and/or Complexity of Data Reviewed Labs: ordered.   ***  {Document critical care time when appropriate  Document review of labs and clinical decision tools ie CHADS2VASC2, etc  Document your independent review of radiology images and any outside records  Document your discussion with family members, caretakers and with consultants  Document social determinants of health affecting pt's care  Document your decision making why or why not admission, treatments were needed:32947:::1}   Final diagnoses:  None    ED Discharge Orders     None

## 2024-05-10 NOTE — ED Triage Notes (Signed)
 Pt reports her SOB has been going on x a couple weeks and BLE swelling xyears. Pt reports she takes a diuretic.

## 2024-05-10 NOTE — ED Triage Notes (Signed)
 Pt c/o shob that started a few weeks ago, pt being seen for same, states her tests had high numbers and she was told to come to ED. Note says d-dimer was elevated. Pt has had swelling in bilateral lower extremities, more in left than right.

## 2024-05-10 NOTE — ED Provider Triage Note (Signed)
 Emergency Medicine Provider Triage Evaluation Note  Brittney Wright , a 89 y.o. female  was evaluated in triage.  Pt complains of sent by PCP for elevated D-dimer, shortness of breath and bilateral lower extremity edema.  Review of Systems  Positive: Lower extremity edema, shortness of breath Negative: Fever, chills, nausea, vomiting  Physical Exam  BP (!) 148/62 (BP Location: Left Arm)   Pulse 74   Temp 98.2 F (36.8 C)   Resp 16   Ht 5' 5 (1.651 m)   Wt 90.3 kg   SpO2 97%   BMI 33.12 kg/m  Gen:   Awake, no distress   Resp:  Normal effort  MSK:   Moves extremities without difficulty  Other:  Bilateral lower extremity pitting edema  Medical Decision Making  Medically screening exam initiated at 2:36 PM.  Appropriate orders placed.  Brittney Wright was informed that the remainder of the evaluation will be completed by another provider, this initial triage assessment does not replace that evaluation, and the importance of remaining in the ED until their evaluation is complete.  Labs and imaging ordered Note patient does not recall anaphylactic reaction to IV contrast, states that her doctor told her to avoid due to CKD.   Brittney Ileana SAILOR, PA-C 05/10/24 [phone]

## 2024-05-10 NOTE — Progress Notes (Signed)
 D-dimer ordered yesterday 05/09/2024 for unexplained shortness of breath and lower extremity swelling.  Lab reached out to on-call provider yesterday night stating that there was a lab error and they recommended it be rechecked.  However the labs drawn on 05/09/2024 were still reported earlier this morning.  When corrected for age the labs within normal limits.  However, before the 05/09/2024 labs were reported patient was already contacted to have them repeated at [facility].  D-dimer from today 05/10/2024 (recheck) remained elevated above age-predicted values and therefore we need to  ruled out DVT and PE.  I personally called the patient to discuss these labs values and the mishaps.  I apologized for the inconvenience this has caused her and also explained to her the importance of following through.   Given her unexplained shortness of breath, lower extremity swelling, and elevated D-dimers patient is advised to go to the ER to rule out DVT and PE respectively.  Patient verbalized understanding.  And was appreciative of the phone call and follow through.   Dr. Vanita Cannell

## 2024-05-13 ENCOUNTER — Telehealth: Payer: Self-pay | Admitting: Cardiology

## 2024-05-13 NOTE — Telephone Encounter (Signed)
 Patient was recently discharged from the hospital. She has issues with transportation and would like to know if Dr. Michele can review her chart and determine whether a follow up is needed at this time. She says it is difficult for her to get to the office and she would prefer if Dr. Michele can review ED notes + provide recommendations over the phone. Please advise.

## 2024-05-14 ENCOUNTER — Telehealth (HOSPITAL_COMMUNITY): Payer: Self-pay | Admitting: *Deleted

## 2024-05-14 NOTE — Telephone Encounter (Signed)
 Patient scheduled for 12/03 at 2:00pm

## 2024-05-14 NOTE — Telephone Encounter (Signed)
Reaching out to patient to offer assistance regarding upcoming cardiac imaging study; pt verbalizes understanding of appt date/time, parking situation and where to check in, pre-test NPO status and verified current allergies; name and call back number provided for further questions should they arise  Brittney Brick RN Navigator Cardiac Imaging [facility] [phone] office [phone] cell  Patient aware to avoid caffeine 12 hours prior to her cardiac PET study.

## 2024-05-14 NOTE — Telephone Encounter (Signed)
 Patient has PET scan tomorrow but was told to see Dr. Michele this week, should she still proceed with PET scan and should she continue her current medication regimen

## 2024-05-15 ENCOUNTER — Other Ambulatory Visit (HOSPITAL_COMMUNITY): Payer: Self-pay | Admitting: Cardiology

## 2024-05-15 ENCOUNTER — Encounter (HOSPITAL_COMMUNITY)
Admission: RE | Admit: 2024-05-15 | Discharge: 2024-05-15 | Disposition: A | Discharge status: Home / Self Care | Source: Ambulatory Visit | Attending: Cardiology | Admitting: Cardiology

## 2024-05-15 DIAGNOSIS — R072 Precordial pain: Secondary | ICD-10-CM

## 2024-05-15 LAB — NM PET CT CARDIAC PERFUSION MULTI W/ABSOLUTE BLOODFLOW
LV dias vol: 65 mL (ref 46–106)
LV sys vol: 22 mL (ref 3.8–5.2)
MBFR: 2.29
Nuc Rest EF: 66 %
Nuc Stress EF: 72 %
Peak HR: 87 {beats}/min
Rest HR: 65 {beats}/min
Rest MBF: 0.87 ml/g/min
Rest Nuclear Isotope Dose: 24 mCi
ST Depression (mm): 0 mm
Stress MBF: 1.99 ml/g/min
Stress Nuclear Isotope Dose: 23 mCi

## 2024-05-15 MED ORDER — RUBIDIUM RB82 GENERATOR (RUBYFILL)
23.9500 | PACK | Freq: Once | INTRAVENOUS | Status: AC
  Administered 2024-05-15: 23.95 via INTRAVENOUS

## 2024-05-15 MED ORDER — REGADENOSON 0.4 MG/5ML IV SOLN
0.4000 mg | Freq: Once | INTRAVENOUS | Status: AC
  Administered 2024-05-15: 0.4 mg via INTRAVENOUS

## 2024-05-15 MED ORDER — REGADENOSON 0.4 MG/5ML IV SOLN
INTRAVENOUS | Status: AC
  Filled 2024-05-15: qty 5

## 2024-05-15 NOTE — Telephone Encounter (Signed)
 I called her just now-it goes to voicemail. I think she may be getting her Cardiac PET/CT. Will try again.   Dr. Vyron Fronczak

## 2024-05-22 ENCOUNTER — Encounter: Payer: Self-pay | Admitting: Cardiology

## 2024-05-22 ENCOUNTER — Ambulatory Visit: Admitting: Cardiology

## 2024-05-22 ENCOUNTER — Ambulatory Visit: Attending: Cardiology | Admitting: Cardiology

## 2024-05-22 VITALS — BP 145/82 | HR 79 | Resp 16 | Ht 65.0 in | Wt 201.4 lb

## 2024-05-22 DIAGNOSIS — I5032 Chronic diastolic (congestive) heart failure: Secondary | ICD-10-CM | POA: Diagnosis not present

## 2024-05-22 DIAGNOSIS — R0609 Other forms of dyspnea: Secondary | ICD-10-CM | POA: Diagnosis not present

## 2024-05-22 DIAGNOSIS — Z794 Long term (current) use of insulin: Secondary | ICD-10-CM

## 2024-05-22 DIAGNOSIS — I10 Essential (primary) hypertension: Secondary | ICD-10-CM | POA: Diagnosis not present

## 2024-05-22 DIAGNOSIS — R931 Abnormal findings on diagnostic imaging of heart and coronary circulation: Secondary | ICD-10-CM | POA: Diagnosis not present

## 2024-05-22 DIAGNOSIS — I2510 Atherosclerotic heart disease of native coronary artery without angina pectoris: Secondary | ICD-10-CM

## 2024-05-22 DIAGNOSIS — I700 Atherosclerosis of aorta: Secondary | ICD-10-CM | POA: Diagnosis not present

## 2024-05-22 DIAGNOSIS — E782 Mixed hyperlipidemia: Secondary | ICD-10-CM

## 2024-05-22 DIAGNOSIS — E1165 Type 2 diabetes mellitus with hyperglycemia: Secondary | ICD-10-CM

## 2024-05-22 DIAGNOSIS — I2584 Coronary atherosclerosis due to calcified coronary lesion: Secondary | ICD-10-CM | POA: Diagnosis not present

## 2024-05-22 DIAGNOSIS — R072 Precordial pain: Secondary | ICD-10-CM

## 2024-05-22 NOTE — Progress Notes (Unsigned)
 Cardiology Office Note:  .   ID:  Brittney Wright, DOB July 04, 1934, MRN [id number] PCP:  Elliot Charm, MD  Former Cardiology Providers: Dr. Annalee Casa [facility] Providers Cardiologist:  Madonna Large, DO , [facility] (established care 04/05/23) Electrophysiologist:  None  Click to update primary MD,subspecialty MD or APP then REFRESH:1}    Chief Complaint  Patient presents with   Follow-up    Re-evaluation of chest pain and Shortness of breath Review test results    History of Present Illness: .   Brittney Wright is a 89 y.o. Caucasian female whose past medical history and cardiovascular risk factors includes: Severe coronary artery calcification, aortic atherosclerosis, hypertension, hyperlipidemia, IDDM Type II, obesity due to excess calories.  Started seeing the patient after she transitioned care in October 2024.  At the last office visit due to her dyspnea on exertion there was concern that she may be developing HFpEF.  The patient thought that her symptoms are likely secondary to increased anxiety.  Patient was recommended to undergo coronary calcium  score for further risk stratification.  She was noted to have severe CAC with a total score of 728 placing her at the 82nd percentile.  She underwent nuclear stress test which was reported to be low risk.  Patient was last seen in March 2025 at that time she was doing well from a cardiovascular standpoint.  With regards to her shortness of breath D-dimers were ordered which were elevated and patient was sent to the ED for further evaluation.  She had a lower extremity venous duplex which was negative for deep venous thrombosis and CT chest was negative for pulmonary embolism.  With regards to chest pain/tightness workup she had a cardiac PET/CT which was negative for reversible ischemia.  She presents today for follow-up.  ***  Review of Systems: .   Review of Systems  Cardiovascular:  Positive for chest pain,  dyspnea on exertion and leg swelling. Negative for claudication, irregular heartbeat, near-syncope, orthopnea, palpitations, paroxysmal nocturnal dyspnea and syncope.  Hematologic/Lymphatic: Negative for bleeding problem.  Musculoskeletal:  Negative for muscle cramps and myalgias.  Neurological:  Negative for dizziness and light-headedness.  Psychiatric/Behavioral:  The patient is nervous/anxious.     Studies Reviewed:    Echocardiogram:  06/22/2022:   Normal LV systolic function with visual EF 60-65%. Left ventricle cavity is normal in size. Normal left ventricular wall thickness. Normal global wall motion. Doppler evidence of grade I (impaired) diastolic dysfunction, normal LAP. Calculated EF 69%. Structurally normal tricuspid valve.  Mild tricuspid regurgitation. No evidence of pulmonary hypertension. No significant change compared to 11/2017.   Stress Testing: [facility] 04/18/2023: Low risk study.  Cardiac PET/CT: 05/15/2024   The study is normal. The study is low risk.   Rest left ventricular function is normal. Rest EF: 66%. Stress EF: 72%. End diastolic cavity size is normal. End systolic cavity size is normal.   Myocardial blood flow was computed to be 0.33ml/g/min at rest and 1.47ml/g/min at stress. Global myocardial blood flow reserve was 2.29 and was normal.   Coronary calcium  was present on the attenuation correction CT images. Severe coronary calcifications were present. Coronary calcifications were present in the left anterior descending artery, left circumflex artery and right coronary artery distribution(s).  Radiology overread: Pending  Coronary Calcium  Score: 05/10/2023 Left main: 46.4 Left anterior descending artery: 303 Left circumflex artery: 148 Right coronary artery: 231 Total: 728, Percentile: 82nd Radiology Overread: Aortic atherosclerosis  RADIOLOGY: NA  Risk Assessment/Calculations:   NA  Labs:       Latest Ref Rng & Units 05/10/2024    2:47 PM  06/18/2012   10:39 AM 03/01/2012    9:47 AM  CBC  WBC 4.0 - 10.5 K/uL 9.0  4.8  7.4   Hemoglobin 12.0 - 15.0 g/dL 87.0  88.6  88.4   Hematocrit 36.0 - 46.0 % 39.9  34.4  36.1   Platelets 150 - 400 K/uL 274  231  273        Latest Ref Rng & Units 05/10/2024    2:47 PM 06/18/2012   10:39 AM 01/31/2012   10:30 AM  BMP  Glucose 70 - 99 mg/dL 817  828  769   BUN 8 - 23 mg/dL 46  69.9  36   Creatinine 0.44 - 1.00 mg/dL 8.24  1.7  8.45   Sodium 135 - 145 mmol/L 137  139  137   Potassium 3.5 - 5.1 mmol/L 4.6  4.9  4.9   Chloride 98 - 111 mmol/L 100  107  103   CO2 22 - 32 mmol/L 23  25  24    Calcium  8.9 - 10.3 mg/dL 9.2  8.9  9.5       Latest Ref Rng & Units 05/10/2024    2:47 PM 06/18/2012   10:39 AM 01/31/2012   10:30 AM  CMP  Glucose 70 - 99 mg/dL 817  828  769   BUN 8 - 23 mg/dL 46  69.9  36   Creatinine 0.44 - 1.00 mg/dL 8.24  1.7  8.45   Sodium 135 - 145 mmol/L 137  139  137   Potassium 3.5 - 5.1 mmol/L 4.6  4.9  4.9   Chloride 98 - 111 mmol/L 100  107  103   CO2 22 - 32 mmol/L 23  25  24    Calcium  8.9 - 10.3 mg/dL 9.2  8.9  9.5   Total Protein 6.4 - 8.3 g/dL  7.0    Total Bilirubin 0.20 - 1.20 mg/dL  9.69    Alkaline Phos 40 - 150 U/L  51    AST 5 - 34 U/L  17    ALT 0 - 55 U/L  11      No results found for: CHOL, HDL, LDLCALC, LDLDIRECT, TRIG, CHOLHDL No results for input(s): LIPOA in the last 8760 hours. No components found for: NTPROBNP No results for input(s): PROBNP in the last 8760 hours. No results for input(s): TSH in the last 8760 hours.  External Labs: Collected: 03/07/2023 [facility] database. Total cholesterol 141, triglycerides 183, HDL 40, LDL 70 Hemoglobin 87.8.  Collected: 02/13/2023 [facility] Database: A1c 8.7. BUN 40, creatinine 1.34  External Labs: Collected: March 2025 performed at [facility] records provided by the patient. Total cholesterol 122, triglycerides 148, HDL 47, LDL calculated 50. BUN 35, creatinine 1.37. Sodium  142, potassium 4.9, chloride 102, bicarb 25. AST, ALT, alkaline phosphatase all within normal limits. A1c 9.6  External Labs: Collected: 05/07/2024 [facility] physicians. BUN 47, creatinine 1.64. Sodium 139, potassium 4.7, chloride 100, bicarb 31. Hemoglobin 12.9, hematocrit 38% BNP 89.5  Physical Exam:    Today's Vitals   05/22/24 1340  BP: (!) 161/82  Pulse: 79  Resp: 16  SpO2: 95%  Weight: 201 lb 6.4 oz (91.4 kg)  Height: 5' 5 (1.651 m)   Body mass index is 33.51 kg/m. Wt Readings from Last 3 Encounters:  05/22/24 201 lb 6.4 oz (91.4 kg)  05/10/24 199 lb (90.3 kg)  05/09/24 199 lb 3.2 oz (90.4 kg)    Physical Exam  Constitutional: No distress.  hemodynamically stable  Neck: No JVD present.  Cardiovascular: Normal rate, regular rhythm, S1 normal and S2 normal. Exam reveals decreased pulses. Exam reveals no gallop, no S3 and no S4.  No murmur heard. Pulmonary/Chest: Effort normal. No stridor. She has no wheezes. She has no rales.  Decreased breath sounds bilaterally.  Abdominal: Soft. Bowel sounds are normal. She exhibits no distension. There is no abdominal tenderness.  Musculoskeletal:        General: Edema present.     Cervical back: Neck supple.  Neurological: She is alert and oriented to person, place, and time. She has intact cranial nerves (2-12).  Skin: Skin is warm and moist.    Impression & Recommendation(s):  Impression:   ICD-10-CM   1. Precordial pain  R07.2 CANCELED: EKG 12-Lead      Recommendation(s):  Coronary atherosclerosis due to calcified coronary lesion Agatston CAC score, >400 Atherosclerosis of aorta (HCC) Total CAC 728, 82nd percentile Continue aspirin  and statin therapy as well as Zetia . EKG nonischemic No active pain Symptoms of chest tightness/pressure and shortness of breath could be cardiac Has multiple cardiovascular risk factors Echo will be ordered to evaluate for structural heart disease and left ventricular systolic  function. Discussed ischemic workup including both invasive and noninvasive approaches.  In a shared decision was to proceed with Cardiac PET/CT to evaluate for reversible ischemia-in the setting of severe CAC.  Risks, benefits, alternatives discussed.  Shortness of breath Chronic heart failure with preserved ejection fraction (HFpEF) (HCC) Stage B, NYHA class II Continue losartan  12.5 mg p.o. daily. Continue torsemide  20 mg p.o. as needed basis. Monitor weight daily; increase torsemide  to 40 mg if weight increases by 1 pound in a day or 3 pounds in a week until weight returns to baseline. Continue carvedilol  6.25 mg p.o. twice daily Patient is edematous on physical examination but her shortness of breath is out of proportion to volume overload. Her BNP checked at PCPs office is within normal limits No prior history of DVT/PE. However, given the acuity of symptoms cannot entirely rule it out To avoid unneeded contrast exposure will start with checking a D-dimer.  If elevated patient understands that she will need to have both DVT and PE studies  Mixed hyperlipidemia Currently on atorvastatin  80 mg p.o. daily.   Continue Zetia  10 mg p.o. daily She denies myalgia or other side effects.  Essential hypertension Blood pressures are well-controlled. Medications as discussed above  Type 2 diabetes mellitus with hyperglycemia, with long-term current use of insulin  (HCC) Reemphasized importance of glycemic control.   Currently on insulin , ARB, statin, Zetia , and Tresiba  Management per PCP.  Orders Placed:  No orders of the defined types were placed in this encounter.   Final Medication List:    No orders of the defined types were placed in this encounter.   There are no discontinued medications.     Current Outpatient Medications:    amoxicillin  (AMOXIL ) 500 MG capsule, Take 1 capsule three times per day for seven days, Disp: 21 capsule, Rfl: 0   aspirin  EC 81 MG tablet, Take 1  tablet (81 mg total) by mouth daily. Swallow whole., Disp: , Rfl:    atorvastatin  (LIPITOR) 80 MG tablet, Take 1 tablet (80 mg total) by mouth daily., Disp: 90 tablet, Rfl: 3   Blood Glucose Monitoring Suppl (BLOOD GLUCOSE MONITOR SYSTEM) w/Device KIT, Use as directed, Disp: 1 kit, Rfl:  0   carvedilol  (COREG ) 6.25 MG tablet, Take 1 tablet (6.25 mg total) by mouth 2 (two) times daily., Disp: 180 tablet, Rfl: 4   cephALEXin  (KEFLEX ) 500 MG capsule, Take 1 capsule (500 mg total) by mouth 2 (two) times daily., Disp: 10 capsule, Rfl: 0   ezetimibe  (ZETIA ) 10 MG tablet, Take 1 tablet (10 mg total) by mouth daily., Disp: 90 tablet, Rfl: 3   glucose blood (ONETOUCH VERIO) test strip, Check blood sugar 2 (two) times daily., Disp: 200 strip, Rfl: 4   insulin  degludec (TRESIBA  FLEXTOUCH) 100 UNIT/ML FlexTouch Pen, Start by injecting 22 units under the skin once daily, then titrate as directed to a maximum daily dose of 50 units, Disp: 30 mL, Rfl: 4   Insulin  Pen Needle 32G X 4 MM MISC, Use once a day., Disp: 100 each, Rfl: 4   Lancets (ONETOUCH DELICA PLUS LANCET33G) MISC, Use to check blood sugar (finger stick) once daily, Disp: 100 each, Rfl: 2   latanoprost  (XALATAN ) 0.005 % ophthalmic solution, Place 1 drop into both eyes at bedtime., Disp: 7.5 mL, Rfl: 2   losartan  (COZAAR ) 25 MG tablet, Take 0.5 tablets (12.5 mg total) by mouth at bedtime., Disp: 100 tablet, Rfl: 3   torsemide  (DEMADEX ) 20 MG tablet, Take 1 tablet (20 mg total) by mouth daily., Disp: , Rfl:   Consent:   NA  Disposition:   ***  Her questions and concerns were addressed to her satisfaction. She voices understanding of the recommendations provided during this encounter.    Signed, Madonna Michele HAS, [facility]  A Division of [facility] [address] [place], [place] [address]

## 2024-05-22 NOTE — Patient Instructions (Signed)
 Medication Instructions:  No Changes  Testing/Procedures: Your physician has requested that you have an echocardiogram, next available appointment. Echocardiography is a painless test that uses sound waves to create images of your heart. It provides your doctor with information about the size and shape of your heart and how well your heart's chambers and valves are working. This procedure takes approximately one hour. There are no restrictions for this procedure. Please do NOT wear cologne, perfume, aftershave, or lotions (deodorant is allowed). Please arrive 15 minutes prior to your appointment time.  Please note: We ask at that you not bring children with you during ultrasound (echo/ vascular) testing. Due to room size and safety concerns, children are not allowed in the ultrasound rooms during exams. Our front office staff cannot provide observation of children in our lobby area while testing is being conducted. An adult accompanying a patient to their appointment will only be allowed in the ultrasound room at the discretion of the ultrasound technician under special circumstances. We apologize for any inconvenience.   Follow-Up: At [facility], you and your health needs are our priority.  As part of our continuing mission to provide you with exceptional heart care, our providers are all part of one team.  This team includes your primary Cardiologist (physician) and Advanced Practice Providers or APPs (Physician Assistants and Nurse Practitioners) who all work together to provide you with the care you need, when you need it.  Your next appointment:    March 2026  Provider:   Madonna Large, DO

## 2024-05-23 ENCOUNTER — Ambulatory Visit: Payer: Self-pay | Admitting: Cardiology

## 2024-05-26 ENCOUNTER — Encounter: Payer: Self-pay | Admitting: Cardiology

## 2024-05-29 ENCOUNTER — Other Ambulatory Visit: Payer: Self-pay

## 2024-05-29 ENCOUNTER — Other Ambulatory Visit (HOSPITAL_COMMUNITY): Payer: Self-pay

## 2024-05-29 MED ORDER — TRESIBA FLEXTOUCH 100 UNIT/ML ~~LOC~~ SOPN
22.0000 [IU] | PEN_INJECTOR | Freq: Every day | SUBCUTANEOUS | 4 refills | Status: AC
  Filled 2024-05-29: qty 30, 60d supply, fill #0

## 2024-05-30 ENCOUNTER — Other Ambulatory Visit (HOSPITAL_COMMUNITY): Payer: Self-pay

## 2024-05-30 ENCOUNTER — Other Ambulatory Visit: Payer: Self-pay

## 2024-05-30 MED ORDER — TORSEMIDE 20 MG PO TABS
20.0000 mg | ORAL_TABLET | Freq: Every day | ORAL | 3 refills | Status: AC
  Filled 2024-05-30: qty 90, 45d supply, fill #0
  Filled ????-??-??: fill #0

## 2024-05-31 ENCOUNTER — Other Ambulatory Visit (HOSPITAL_COMMUNITY): Payer: Self-pay

## 2024-05-31 ENCOUNTER — Encounter: Payer: Self-pay | Admitting: Cardiology

## 2024-05-31 ENCOUNTER — Other Ambulatory Visit: Payer: Self-pay

## 2024-05-31 MED ORDER — TORSEMIDE 20 MG PO TABS
20.0000 mg | ORAL_TABLET | Freq: Every day | ORAL | 3 refills | Status: AC
  Filled 2024-05-31: qty 90, 45d supply, fill #0

## 2024-06-03 NOTE — Telephone Encounter (Signed)
 [facility] is there referral portal / access.  Could you provide Ms. Greenspan the scheduling phone number used to make pulmonary appointments?  Dr. Anselm Aumiller

## 2024-06-06 ENCOUNTER — Encounter: Payer: Self-pay | Admitting: Cardiology

## 2024-06-06 NOTE — Telephone Encounter (Signed)
 From cardiology standpoint she can get them.  Talk to your PCP - based on your co-morbid conditions should you stagger them out?   Dr. Anita Mcadory

## 2024-06-11 ENCOUNTER — Other Ambulatory Visit (HOSPITAL_COMMUNITY): Payer: Self-pay

## 2024-06-11 MED ORDER — FLUZONE HIGH-DOSE 0.5 ML IM SUSY
0.5000 mL | PREFILLED_SYRINGE | Freq: Once | INTRAMUSCULAR | 0 refills | Status: AC
  Filled 2024-06-11: qty 0.5, 1d supply, fill #0

## 2024-06-18 ENCOUNTER — Other Ambulatory Visit: Payer: Self-pay

## 2024-06-18 ENCOUNTER — Other Ambulatory Visit (HOSPITAL_COMMUNITY): Payer: Self-pay

## 2024-06-18 MED ORDER — CARVEDILOL 6.25 MG PO TABS
6.2500 mg | ORAL_TABLET | Freq: Two times a day (BID) | ORAL | 0 refills | Status: AC
  Filled 2024-06-18: qty 180, 90d supply, fill #0

## 2024-06-27 ENCOUNTER — Other Ambulatory Visit (HOSPITAL_COMMUNITY): Payer: Self-pay

## 2024-06-27 MED ORDER — COVID-19 MRNA VAC-TRIS(PFIZER) 30 MCG/0.3ML IM SUSY
0.3000 mL | PREFILLED_SYRINGE | Freq: Once | INTRAMUSCULAR | 0 refills | Status: AC
  Filled 2024-06-27: qty 0.3, 1d supply, fill #0

## 2024-07-01 ENCOUNTER — Ambulatory Visit: Admitting: Pulmonary Disease

## 2024-07-01 ENCOUNTER — Encounter: Payer: Self-pay | Admitting: Pulmonary Disease

## 2024-07-01 VITALS — BP 155/83 | HR 74 | Ht 65.0 in | Wt 204.0 lb

## 2024-07-01 DIAGNOSIS — R0602 Shortness of breath: Secondary | ICD-10-CM | POA: Diagnosis not present

## 2024-07-01 DIAGNOSIS — Z87891 Personal history of nicotine dependence: Secondary | ICD-10-CM

## 2024-07-01 DIAGNOSIS — R918 Other nonspecific abnormal finding of lung field: Secondary | ICD-10-CM | POA: Diagnosis not present

## 2024-07-01 NOTE — Patient Instructions (Addendum)
 Schedule pulmonary function tests at the front desk, we will schedule you as soon as possible  Consider starting as needed albuterol inhaler 1-2 puffs every 4-6 hours  We will follow up the nodules on your lungs over the next couple of years, will order scans at future visits  Follow up in 2 months

## 2024-07-01 NOTE — Progress Notes (Signed)
 "  New Patient Pulmonology Office Visit   Subjective:  Patient ID: Brittney Wright, female    DOB: Sep 17, 1934  MRN: [id number]  Referred by: Elliot Charm, MD  CC:  Chief Complaint  Patient presents with   Consult    Pt states SOB x off & on 2-3 years     Discussed the use of AI scribe software for clinical note transcription with the patient, who gave verbal consent to proceed.  History of Present Illness Brittney Wright is an 89 year old female with heart failure who presents with shortness of breath and chest tightness.  She reports shortness of breath and occasional chest tightness that occur mainly in November and December, both this year and last year, with minimal symptoms the rest of the year. Episodes sometimes wake her from sleep. She denies cough or wheezing.  She has heart failure managed by cardiology and had heart scans and a chest CT. She went to the emergency room in November for these symptoms. She has never had pulmonary function testing.  She quit light cigarette use in 1993 and had heavy secondhand smoke exposure from both parents. She denies occupational or hobby inhalational exposures. She was told prior chest imaging showed small lung nodules.  She has chronic leg swelling over years. She lives alone. Since moving to [facility] she has had perennial allergy symptoms with runny nose.  She has never used inhalers. Her respiratory symptoms recurred in November similar to last year and then resolved without treatment.        Review of Systems  Constitutional:  Negative for chills, fever, malaise/fatigue and weight loss.  HENT:  Negative for congestion, sinus pain and sore throat.   Eyes: Negative.   Respiratory:  Positive for shortness of breath. Negative for cough, hemoptysis, sputum production and wheezing.   Cardiovascular:  Positive for leg swelling. Negative for chest pain, palpitations, orthopnea and claudication.  Gastrointestinal:   Negative for abdominal pain, heartburn, nausea and vomiting.  Genitourinary: Negative.   Musculoskeletal:  Positive for joint pain. Negative for myalgias.  Skin:  Negative for rash.  Neurological:  Negative for weakness.  Endo/Heme/Allergies: Negative.   Psychiatric/Behavioral:  Positive for depression. The patient is nervous/anxious.     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   Cataract    Depression    Diabetes mellitus    History of radiation therapy 03/28/12- 04/26/12   left breast 4250 cGy 17 sessions, left breast boost 750 cGy 3 sessions   Hot flashes    Hypertension    Personal history of radiation therapy    SOB (shortness of breath) on exertion    Past Surgical History:  Procedure Laterality Date   BREAST BIOPSY     BREAST BIOPSY Left 04/28/2023   US  LT BREAST BX W LOC DEV 1ST LESION IMG BX SPEC US  GUIDE 04/28/2023 [facility]   BREAST LUMPECTOMY Left 2013   BREAST SURGERY  1976   rt br bx   CATARACT EXTRACTION Bilateral    08/2011 and 05/2011   COLONOSCOPY     DILATION AND CURETTAGE OF UTERUS     EYE SURGERY  12,13   cataracts-both   Family History  Problem Relation Age of Onset   Heart attack Mother    Stroke Mother    Heart attack Father    Hypertension  Sister    Heart Problems Paternal Grandfather    Breast cancer Paternal Aunt    Social History   Socioeconomic History   Marital status: Divorced    Spouse name: Not on file   Number of children: 1   Years of education: Not on file   Highest education level: Not on file  Occupational History   Not on file  Tobacco Use   Smoking status: Former    Current packs/day: 0.00    Types: Cigarettes    Quit date: 06/21/1991    Years since quitting: 33.0   Smokeless tobacco: Never  Vaping Use   Vaping status: Never  Used  Substance and Sexual Activity   Alcohol use: Not Currently    Comment: Rarely   Drug use: No   Sexual activity: Not Currently    Birth control/protection: Post-menopausal  Other Topics Concern   Not on file  Social History Narrative   1 deceased   Social Drivers of Health   Tobacco Use: Medium Risk (07/01/2024)   Patient History    Smoking Tobacco Use: Former    Smokeless Tobacco Use: Never    Passive Exposure: Not on Actuary Strain: Not on file  Food Insecurity: Not on file  Transportation Needs: Not on file  Physical Activity: Not on file  Stress: Not on file  Social Connections: Not on file  Intimate Partner Violence: Not on file  Depression (PHQ2-9): Not on file  Alcohol Screen: Not on file  Housing: Not on file  Utilities: Not on file  Health Literacy: Not on file       Objective:  BP (!) 155/83 (BP Location: Left Arm, Cuff Size: Large)   Pulse 74   Ht 5' 5 (1.651 m) Comment: per pt  Wt 204 lb (92.5 kg)   SpO2 97%   BMI 33.95 kg/m    Physical Exam Constitutional:      General: She is not in acute distress.    Appearance: Normal appearance. She is obese.  Eyes:     General: No scleral icterus.    Conjunctiva/sclera: Conjunctivae normal.  Cardiovascular:     Rate and Rhythm: Normal rate and regular rhythm.  Pulmonary:     Breath sounds: No wheezing, rhonchi or rales.  Musculoskeletal:     Right lower leg: Edema present.     Left lower leg: Edema present.  Skin:    General: Skin is warm and dry.  Neurological:     General: No focal deficit present.     Diagnostic Review:  Last CBC Lab Results  Component Value Date   WBC 9.0 05/10/2024   HGB 12.9 05/10/2024   HCT 39.9 05/10/2024   MCV 90.9 05/10/2024   MCH 29.4 05/10/2024   RDW 12.6 05/10/2024   PLT 274 05/10/2024   Last metabolic panel Lab Results  Component Value Date   GLUCOSE 182 (H) 05/10/2024   NA 137 05/10/2024   K 4.6 05/10/2024   CL 100 05/10/2024    CO2 23 05/10/2024   BUN 46 (H) 05/10/2024   CREATININE 1.75 (H) 05/10/2024   GFRNONAA 28 (L) 05/10/2024   CALCIUM  9.2 05/10/2024   PROT 7.0 06/18/2012   ALBUMIN 3.5 06/18/2012   BILITOT 0.30 06/18/2012   ALKPHOS 51 06/18/2012   AST 17 06/18/2012   ALT 11 06/18/2012   ANIONGAP 14 05/10/2024   CTA PE Chest 05/10/24 1. No evidence for pulmonary embolism. 2. Multiple pulmonary nodules. Most significant: Right ground-glass pulmonary  nodule measuring 4 mm. Follow-up by chest CT without contrast is recommended in 2 years, and again at 4 years to confirm stability. This recommendation follows the consensus statement: Recommendations for the Management of Subsolid Pulmonary Nodules Detected at CT: A Statement from the Fleischner Society as published in Radiology 2013; 266:304-317. 3. Small hiatal hernia. 4. Aortic atherosclerosis.    Assessment & Plan:   Assessment & Plan Shortness of breath  Orders:   Pulmonary Function Test; Future  Lung nodules      Assessment and Plan Assessment & Plan Chronic and recurrent shortness of breath Differential includes obstructive lung disease, anxiety, and heart failure. Cardiac evaluations and CT scans unremarkable. Low lung cancer risk. Potential triggers: cold air and low humidity during winter months. - Ordered pulmonary function test to assess for obstructive lung disease. - Discussed albuterol inhaler use; patient prefers to wait. - Recommended humidifier in bedroom and living area - Scheduled follow-up in two months or sooner if test results available.  Pulmonary nodules, stable 3-4 mm nodules on CT, low malignancy risk. Likely due to past infections, scarring, or environmental exposures. - Repeat CT scan in 1-2 years      Return in about 2 months (around 08/29/2024) for f/u visit Dr. Kara.   Dorn KATHEE Kara, MD     [1]  Current Outpatient Medications:    aspirin  EC 81 MG tablet, Take 1 tablet (81 mg total) by mouth  daily. Swallow whole., Disp: , Rfl:    atorvastatin  (LIPITOR) 80 MG tablet, Take 1 tablet (80 mg total) by mouth daily., Disp: 90 tablet, Rfl: 3   Blood Glucose Monitoring Suppl (BLOOD GLUCOSE MONITOR SYSTEM) w/Device KIT, Use as directed, Disp: 1 kit, Rfl: 0   carvedilol  (COREG ) 6.25 MG tablet, Take 1 tablet (6.25 mg total) by mouth 2 (two) times daily., Disp: 180 tablet, Rfl: 0   cephALEXin  (KEFLEX ) 500 MG capsule, Take 1 capsule (500 mg total) by mouth 2 (two) times daily., Disp: 10 capsule, Rfl: 0   ezetimibe  (ZETIA ) 10 MG tablet, Take 1 tablet (10 mg total) by mouth daily., Disp: 90 tablet, Rfl: 3   glucose blood (ONETOUCH VERIO) test strip, Check blood sugar 2 (two) times daily., Disp: 200 strip, Rfl: 4   insulin  degludec (TRESIBA  FLEXTOUCH) 100 UNIT/ML FlexTouch Pen, Start by injecting 22 units under the skin once daily, then titrate as directed to a maximum daily dose of 50 units, Disp: 30 mL, Rfl: 4   Insulin  Pen Needle 32G X 4 MM MISC, Use once a day., Disp: 100 each, Rfl: 4   Lancets (ONETOUCH DELICA PLUS LANCET33G) MISC, Use to check blood sugar (finger stick) once daily, Disp: 100 each, Rfl: 2   latanoprost  (XALATAN ) 0.005 % ophthalmic solution, Place 1 drop into both eyes at bedtime., Disp: 7.5 mL, Rfl: 2   losartan  (COZAAR ) 25 MG tablet, Take 1/2 tablet (12.5 mg total) by mouth at bedtime., Disp: 100 tablet, Rfl: 3   torsemide  (DEMADEX ) 20 MG tablet, Take 1 tablet (20 mg total) by mouth daily., Disp: , Rfl:    torsemide  (DEMADEX ) 20 MG tablet, Take 1-2 tablets (20-40 mg total) by mouth daily., Disp: 90 tablet, Rfl: 3   torsemide  (DEMADEX ) 20 MG tablet, Take 1-2 tablets (20-40 mg total) by mouth daily., Disp: 90 tablet, Rfl: 3   amoxicillin  (AMOXIL ) 500 MG capsule, Take 1 capsule three times per day for seven days (Patient not taking: Reported on 07/01/2024), Disp: 21 capsule, Rfl: 0  "

## 2024-07-08 ENCOUNTER — Ambulatory Visit

## 2024-07-08 DIAGNOSIS — R0602 Shortness of breath: Secondary | ICD-10-CM | POA: Diagnosis not present

## 2024-07-08 LAB — PULMONARY FUNCTION TEST
DL/VA % pred: 89 %
DL/VA: 3.6 ml/min/mmHg/L
DLCO cor % pred: 67 %
DLCO cor: 12.76 ml/min/mmHg
DLCO unc % pred: 66 %
DLCO unc: 12.56 ml/min/mmHg
FEF 25-75 Post: 0.91 L/s
FEF 25-75 Pre: 0.9 L/s
FEF2575-%Change-Post: 1 %
FEF2575-%Pred-Post: 82 %
FEF2575-%Pred-Pre: 81 %
FEV1-%Change-Post: 0 %
FEV1-%Pred-Post: 81 %
FEV1-%Pred-Pre: 81 %
FEV1-Post: 1.46 L
FEV1-Pre: 1.45 L
FEV1FVC-%Change-Post: 0 %
FEV1FVC-%Pred-Pre: 96 %
FEV6-%Change-Post: 1 %
FEV6-%Pred-Post: 91 %
FEV6-%Pred-Pre: 90 %
FEV6-Post: 2.08 L
FEV6-Pre: 2.05 L
FEV6FVC-%Change-Post: 0 %
FEV6FVC-%Pred-Post: 105 %
FEV6FVC-%Pred-Pre: 105 %
FVC-%Change-Post: 1 %
FVC-%Pred-Post: 86 %
FVC-%Pred-Pre: 85 %
FVC-Post: 2.09 L
FVC-Pre: 2.07 L
Post FEV1/FVC ratio: 70 %
Post FEV6/FVC ratio: 99 %
Pre FEV1/FVC ratio: 70 %
Pre FEV6/FVC Ratio: 99 %
RV % pred: 129 %
RV: 3.34 L
TLC % pred: 105 %
TLC: 5.48 L

## 2024-07-08 NOTE — Patient Instructions (Signed)
 Full pft performed today

## 2024-07-08 NOTE — Progress Notes (Signed)
 Full pft performed today

## 2024-07-11 ENCOUNTER — Ambulatory Visit: Payer: Self-pay | Admitting: Pulmonary Disease

## 2024-07-11 ENCOUNTER — Other Ambulatory Visit (HOSPITAL_COMMUNITY): Payer: Self-pay

## 2024-07-11 ENCOUNTER — Ambulatory Visit (HOSPITAL_COMMUNITY)
Admission: RE | Admit: 2024-07-11 | Discharge: 2024-07-11 | Disposition: A | Discharge status: Home / Self Care | Source: Ambulatory Visit | Attending: Cardiology | Admitting: Cardiology

## 2024-07-11 DIAGNOSIS — I5032 Chronic diastolic (congestive) heart failure: Secondary | ICD-10-CM | POA: Diagnosis present

## 2024-07-11 DIAGNOSIS — R0609 Other forms of dyspnea: Secondary | ICD-10-CM | POA: Insufficient documentation

## 2024-07-11 LAB — ECHOCARDIOGRAM COMPLETE
Area-P 1/2: 2.9 cm2
S' Lateral: 2.5 cm

## 2024-07-12 ENCOUNTER — Ambulatory Visit: Payer: Self-pay | Admitting: Cardiology

## 2024-09-02 ENCOUNTER — Ambulatory Visit: Admitting: Pulmonary Disease

## 2024-09-12 ENCOUNTER — Ambulatory Visit: Admitting: Cardiology
